# Patient Record
Sex: Male | Born: 1960 | Hispanic: Yes | Marital: Married | Smoking: Never smoker
Health system: Southern US, Community
[De-identification: ages and names within clinical notes are randomized; demographics above are authoritative.]

---

## 2017-08-04 ENCOUNTER — Emergency Department: Payer: Medicaid Other

## 2017-08-04 ENCOUNTER — Inpatient Hospital Stay: Payer: Medicaid Other | Admitting: Anesthesiology

## 2017-08-04 ENCOUNTER — Inpatient Hospital Stay
Admission: EM | Admit: 2017-08-04 | Discharge: 2017-09-03 | DRG: 853 | Disposition: E | Payer: Medicaid Other | Attending: Surgery | Admitting: Surgery

## 2017-08-04 ENCOUNTER — Encounter: Payer: Self-pay | Admitting: Radiology

## 2017-08-04 ENCOUNTER — Encounter: Admission: EM | Disposition: E | Payer: Self-pay | Source: Home / Self Care | Attending: Surgery

## 2017-08-04 ENCOUNTER — Other Ambulatory Visit: Payer: Self-pay

## 2017-08-04 DIAGNOSIS — M7989 Other specified soft tissue disorders: Secondary | ICD-10-CM | POA: Diagnosis not present

## 2017-08-04 DIAGNOSIS — K651 Peritoneal abscess: Secondary | ICD-10-CM | POA: Diagnosis present

## 2017-08-04 DIAGNOSIS — K659 Peritonitis, unspecified: Secondary | ICD-10-CM | POA: Diagnosis not present

## 2017-08-04 DIAGNOSIS — K668 Other specified disorders of peritoneum: Secondary | ICD-10-CM

## 2017-08-04 DIAGNOSIS — J189 Pneumonia, unspecified organism: Secondary | ICD-10-CM

## 2017-08-04 DIAGNOSIS — I96 Gangrene, not elsewhere classified: Secondary | ICD-10-CM | POA: Diagnosis present

## 2017-08-04 DIAGNOSIS — K567 Ileus, unspecified: Secondary | ICD-10-CM | POA: Diagnosis not present

## 2017-08-04 DIAGNOSIS — R6520 Severe sepsis without septic shock: Secondary | ICD-10-CM | POA: Diagnosis not present

## 2017-08-04 DIAGNOSIS — E7220 Disorder of urea cycle metabolism, unspecified: Secondary | ICD-10-CM | POA: Diagnosis present

## 2017-08-04 DIAGNOSIS — J9811 Atelectasis: Secondary | ICD-10-CM | POA: Diagnosis not present

## 2017-08-04 DIAGNOSIS — E8809 Other disorders of plasma-protein metabolism, not elsewhere classified: Secondary | ICD-10-CM | POA: Diagnosis present

## 2017-08-04 DIAGNOSIS — Z515 Encounter for palliative care: Secondary | ICD-10-CM | POA: Diagnosis present

## 2017-08-04 DIAGNOSIS — J9690 Respiratory failure, unspecified, unspecified whether with hypoxia or hypercapnia: Secondary | ICD-10-CM

## 2017-08-04 DIAGNOSIS — J9601 Acute respiratory failure with hypoxia: Secondary | ICD-10-CM | POA: Diagnosis not present

## 2017-08-04 DIAGNOSIS — D62 Acute posthemorrhagic anemia: Secondary | ICD-10-CM | POA: Diagnosis not present

## 2017-08-04 DIAGNOSIS — R748 Abnormal levels of other serum enzymes: Secondary | ICD-10-CM | POA: Diagnosis present

## 2017-08-04 DIAGNOSIS — J988 Other specified respiratory disorders: Secondary | ICD-10-CM

## 2017-08-04 DIAGNOSIS — K631 Perforation of intestine (nontraumatic): Secondary | ICD-10-CM | POA: Diagnosis present

## 2017-08-04 DIAGNOSIS — E872 Acidosis: Secondary | ICD-10-CM | POA: Diagnosis present

## 2017-08-04 DIAGNOSIS — N179 Acute kidney failure, unspecified: Secondary | ICD-10-CM

## 2017-08-04 DIAGNOSIS — Z4659 Encounter for fitting and adjustment of other gastrointestinal appliance and device: Secondary | ICD-10-CM

## 2017-08-04 DIAGNOSIS — E873 Alkalosis: Secondary | ICD-10-CM | POA: Diagnosis present

## 2017-08-04 DIAGNOSIS — Z6833 Body mass index (BMI) 33.0-33.9, adult: Secondary | ICD-10-CM

## 2017-08-04 DIAGNOSIS — K429 Umbilical hernia without obstruction or gangrene: Secondary | ICD-10-CM | POA: Diagnosis present

## 2017-08-04 DIAGNOSIS — R5381 Other malaise: Secondary | ICD-10-CM | POA: Diagnosis not present

## 2017-08-04 DIAGNOSIS — J9600 Acute respiratory failure, unspecified whether with hypoxia or hypercapnia: Secondary | ICD-10-CM | POA: Diagnosis not present

## 2017-08-04 DIAGNOSIS — N170 Acute kidney failure with tubular necrosis: Secondary | ICD-10-CM | POA: Diagnosis present

## 2017-08-04 DIAGNOSIS — Z452 Encounter for adjustment and management of vascular access device: Secondary | ICD-10-CM

## 2017-08-04 DIAGNOSIS — Z95828 Presence of other vascular implants and grafts: Secondary | ICD-10-CM

## 2017-08-04 DIAGNOSIS — R410 Disorientation, unspecified: Secondary | ICD-10-CM | POA: Diagnosis not present

## 2017-08-04 DIAGNOSIS — E871 Hypo-osmolality and hyponatremia: Secondary | ICD-10-CM | POA: Diagnosis present

## 2017-08-04 DIAGNOSIS — J90 Pleural effusion, not elsewhere classified: Secondary | ICD-10-CM | POA: Diagnosis present

## 2017-08-04 DIAGNOSIS — A419 Sepsis, unspecified organism: Principal | ICD-10-CM | POA: Diagnosis present

## 2017-08-04 DIAGNOSIS — Z66 Do not resuscitate: Secondary | ICD-10-CM | POA: Diagnosis present

## 2017-08-04 DIAGNOSIS — D696 Thrombocytopenia, unspecified: Secondary | ICD-10-CM | POA: Diagnosis present

## 2017-08-04 DIAGNOSIS — R109 Unspecified abdominal pain: Secondary | ICD-10-CM | POA: Diagnosis present

## 2017-08-04 DIAGNOSIS — K650 Generalized (acute) peritonitis: Secondary | ICD-10-CM | POA: Diagnosis present

## 2017-08-04 DIAGNOSIS — E861 Hypovolemia: Secondary | ICD-10-CM | POA: Diagnosis present

## 2017-08-04 DIAGNOSIS — E875 Hyperkalemia: Secondary | ICD-10-CM | POA: Diagnosis present

## 2017-08-04 DIAGNOSIS — E869 Volume depletion, unspecified: Secondary | ICD-10-CM | POA: Diagnosis present

## 2017-08-04 DIAGNOSIS — R6521 Severe sepsis with septic shock: Secondary | ICD-10-CM | POA: Diagnosis present

## 2017-08-04 DIAGNOSIS — Z978 Presence of other specified devices: Secondary | ICD-10-CM

## 2017-08-04 DIAGNOSIS — R451 Restlessness and agitation: Secondary | ICD-10-CM | POA: Diagnosis not present

## 2017-08-04 DIAGNOSIS — R140 Abdominal distension (gaseous): Secondary | ICD-10-CM

## 2017-08-04 DIAGNOSIS — E781 Pure hyperglyceridemia: Secondary | ICD-10-CM | POA: Diagnosis present

## 2017-08-04 DIAGNOSIS — S31109A Unspecified open wound of abdominal wall, unspecified quadrant without penetration into peritoneal cavity, initial encounter: Secondary | ICD-10-CM

## 2017-08-04 HISTORY — PX: LAPAROTOMY: SHX154

## 2017-08-04 LAB — CBC
HCT: 38 % — ABNORMAL LOW (ref 40.0–52.0)
Hemoglobin: 12.6 g/dL — ABNORMAL LOW (ref 13.0–18.0)
MCH: 24.3 pg — AB (ref 26.0–34.0)
MCHC: 33.1 g/dL (ref 32.0–36.0)
MCV: 73.3 fL — AB (ref 80.0–100.0)
PLATELETS: 319 10*3/uL (ref 150–440)
RBC: 5.19 MIL/uL (ref 4.40–5.90)
RDW: 18.5 % — AB (ref 11.5–14.5)
WBC: 24.6 10*3/uL — ABNORMAL HIGH (ref 3.8–10.6)

## 2017-08-04 LAB — BASIC METABOLIC PANEL
Anion gap: 27 — ABNORMAL HIGH (ref 5–15)
BUN: 96 mg/dL — AB (ref 6–20)
CALCIUM: 8.6 mg/dL — AB (ref 8.9–10.3)
CO2: 15 mmol/L — ABNORMAL LOW (ref 22–32)
CREATININE: 3.42 mg/dL — AB (ref 0.61–1.24)
Chloride: 91 mmol/L — ABNORMAL LOW (ref 101–111)
GFR calc Af Amer: 21 mL/min — ABNORMAL LOW (ref >60)
GFR calc non Af Amer: 18 mL/min — ABNORMAL LOW (ref >60)
GLUCOSE: 169 mg/dL — AB (ref 65–99)
Potassium: 5.2 mmol/L — ABNORMAL HIGH (ref 3.5–5.1)
SODIUM: 133 mmol/L — AB (ref 135–145)

## 2017-08-04 LAB — LACTIC ACID, PLASMA: LACTIC ACID, VENOUS: 5.7 mmol/L — AB (ref 0.5–1.9)

## 2017-08-04 SURGERY — LAPAROTOMY, EXPLORATORY
Anesthesia: General | Site: Abdomen | Wound class: Dirty or Infected

## 2017-08-04 MED ORDER — FENTANYL CITRATE (PF) 250 MCG/5ML IJ SOLN
INTRAMUSCULAR | Status: AC
  Filled 2017-08-04: qty 5

## 2017-08-04 MED ORDER — FENTANYL CITRATE (PF) 100 MCG/2ML IJ SOLN
INTRAMUSCULAR | Status: DC | PRN
  Administered 2017-08-04: 100 ug via INTRAVENOUS
  Administered 2017-08-05: 50 ug via INTRAVENOUS
  Administered 2017-08-05: 100 ug via INTRAVENOUS
  Administered 2017-08-05 (×4): 50 ug via INTRAVENOUS

## 2017-08-04 MED ORDER — MORPHINE SULFATE (PF) 4 MG/ML IV SOLN
4.0000 mg | Freq: Once | INTRAVENOUS | Status: AC
  Administered 2017-08-04: 4 mg via INTRAVENOUS
  Filled 2017-08-04: qty 1

## 2017-08-04 MED ORDER — SUCCINYLCHOLINE CHLORIDE 20 MG/ML IJ SOLN
INTRAMUSCULAR | Status: DC | PRN
  Administered 2017-08-04: 100 mg via INTRAVENOUS

## 2017-08-04 MED ORDER — LIDOCAINE HCL (CARDIAC) 20 MG/ML IV SOLN
INTRAVENOUS | Status: DC | PRN
  Administered 2017-08-04: 100 mg via INTRAVENOUS

## 2017-08-04 MED ORDER — PROPOFOL 10 MG/ML IV BOLUS
INTRAVENOUS | Status: DC | PRN
  Administered 2017-08-04: 80 mg via INTRAVENOUS

## 2017-08-04 MED ORDER — SODIUM CHLORIDE 0.9 % IV BOLUS
1000.0000 mL | Freq: Once | INTRAVENOUS | Status: AC
  Administered 2017-08-04: 1000 mL via INTRAVENOUS

## 2017-08-04 MED ORDER — SODIUM CHLORIDE 0.9 % IV SOLN
INTRAVENOUS | Status: DC | PRN
  Administered 2017-08-04: 23:00:00 via INTRAVENOUS

## 2017-08-04 MED ORDER — PROPOFOL 10 MG/ML IV BOLUS
INTRAVENOUS | Status: AC
Start: 2017-08-04 — End: ?
  Filled 2017-08-04: qty 20

## 2017-08-04 MED ORDER — PIPERACILLIN-TAZOBACTAM 3.375 G IVPB 30 MIN
3.3750 g | Freq: Once | INTRAVENOUS | Status: AC
  Administered 2017-08-04: 3.375 g via INTRAVENOUS
  Filled 2017-08-04: qty 50

## 2017-08-04 MED ORDER — IOPAMIDOL (ISOVUE-300) INJECTION 61%: 100.0000 mL | Freq: Once | INTRAVENOUS | Status: DC | PRN

## 2017-08-04 MED ORDER — LORAZEPAM 2 MG/ML IJ SOLN
INTRAMUSCULAR | Status: AC
  Administered 2017-08-04: 2 mg
  Filled 2017-08-04: qty 1

## 2017-08-04 SURGICAL SUPPLY — 40 items
CANISTER SUCT 1200ML W/VALVE (MISCELLANEOUS) ×3 IMPLANT
CANISTER SUCT 3000ML PPV (MISCELLANEOUS) ×6 IMPLANT
CHLORAPREP W/TINT 26ML (MISCELLANEOUS) ×3 IMPLANT
DRAPE LAPAROTOMY 100X77 ABD (DRAPES) ×3 IMPLANT
DRAPE TABLE BACK 80X90 (DRAPES) ×3 IMPLANT
DRSG OPSITE POSTOP 4X10 (GAUZE/BANDAGES/DRESSINGS) IMPLANT
DRSG OPSITE POSTOP 4X8 (GAUZE/BANDAGES/DRESSINGS) IMPLANT
DRSG TEGADERM 4X10 (GAUZE/BANDAGES/DRESSINGS) IMPLANT
DRSG TELFA 3X8 NADH (GAUZE/BANDAGES/DRESSINGS) IMPLANT
ELECT BLADE 6.5 EXT (BLADE) ×3 IMPLANT
ELECT CAUTERY BLADE 6.4 (BLADE) ×3 IMPLANT
ELECT REM PT RETURN 9FT ADLT (ELECTROSURGICAL) ×3
ELECTRODE REM PT RTRN 9FT ADLT (ELECTROSURGICAL) ×1 IMPLANT
GLOVE BIO SURGEON STRL SZ 6.5 (GLOVE) ×14 IMPLANT
GLOVE BIO SURGEONS STRL SZ 6.5 (GLOVE) ×7
GOWN STRL REUS W/ TWL LRG LVL3 (GOWN DISPOSABLE) ×3 IMPLANT
GOWN STRL REUS W/TWL LRG LVL3 (GOWN DISPOSABLE) ×6
HANDLE SUCTION POOLE (INSTRUMENTS) ×1 IMPLANT
KIT TURNOVER KIT A (KITS) ×3 IMPLANT
LABEL OR SOLS (LABEL) IMPLANT
LIGASURE IMPACT 36 18CM CVD LR (INSTRUMENTS) ×3 IMPLANT
NS IRRIG 1000ML POUR BTL (IV SOLUTION) ×18 IMPLANT
PACK BASIN MAJOR ARMC (MISCELLANEOUS) ×3 IMPLANT
PACK COLON CLEAN CLOSURE (MISCELLANEOUS) IMPLANT
RETAINER VISCERA MED (MISCELLANEOUS) ×3 IMPLANT
SPONGE LAP 18X18 5 PK (GAUZE/BANDAGES/DRESSINGS) ×3 IMPLANT
STAPLER SKIN PROX 35W (STAPLE) ×3 IMPLANT
SUCTION POOLE HANDLE (INSTRUMENTS) ×3
SUT PDS AB 1 TP1 54 (SUTURE) ×3 IMPLANT
SUT SILK 2 0 (SUTURE) ×2
SUT SILK 2-0 18XBRD TIE 12 (SUTURE) ×1 IMPLANT
SUT SILK 3 0 (SUTURE) ×2
SUT SILK 3-0 18XBRD TIE 12 (SUTURE) ×1 IMPLANT
SUT VIC AB 2-0 SH 27 (SUTURE) ×2
SUT VIC AB 2-0 SH 27XBRD (SUTURE) ×1 IMPLANT
SUT VIC AB 3-0 SH 27 (SUTURE) ×2
SUT VIC AB 3-0 SH 27X BRD (SUTURE) ×1 IMPLANT
SWAB CULTURE AMIES ANAERIB BLU (MISCELLANEOUS) ×3 IMPLANT
SYR BULB IRRIG 60ML STRL (SYRINGE) ×3 IMPLANT
TRAY FOLEY W/METER SILVER 16FR (SET/KITS/TRAYS/PACK) ×3 IMPLANT

## 2017-08-04 NOTE — Consult Note (Signed)
SURGICAL CONSULTATION NOTE   HISTORY OF PRESENT ILLNESS (HPI):  57 y.o. male presented to [facility] ED for evaluation of pneumoperitoneum. Patient's wife reports patient has been disoriented today and more drowsy after CT scan due to Ativan. History taken by wife. Wife of patient refer that patient has been feeling ill since last week but has been complaining of abdominal pain since 3 days ago. The pain has progressed and getting worse to the point that she asked him to take him to the Hospital. Wife refer he has has associated fever, nausea and vomiting. Today patient developed altered mental status. The wife refers that he has never gone to a physician since 22 years ago for the physical exam for the green card. Patient does not take any medication at home. Wife denies use of illicit drug or alcohol intake. Denies diarrhea.   Surgery is consulted by Dr. Derrill KayGoodman in this context for evaluation and management of pneumoperitoneum.  PAST MEDICAL HISTORY (PMH):  History reviewed. No pertinent past medical history.   PAST SURGICAL HISTORY (PSH):   The history reviewed.   MEDICATIONS:  Prior to Admission medications   Not on File    ALLERGIES:  Not on File   SOCIAL HISTORY:  Social History   Socioeconomic History  . Marital status: Married    Spouse name: Not on file  . Number of children: Not on file  . Years of education: Not on file  . Highest education level: Not on file  Occupational History  . Not on file  Social Needs  . Financial resource strain: Not on file  . Food insecurity:    Worry: Not on file    Inability: Not on file  . Transportation needs:    Medical: Not on file    Non-medical: Not on file  Tobacco Use  . Smoking status: Not on file  Substance and Sexual Activity  . Alcohol use: Not on file  . Drug use: Not on file  . Sexual activity: Not on file  Lifestyle  . Physical activity:    Days per week: Not on file    Minutes per session: Not on file  . Stress: Not  on file  Relationships  . Social connections:    Talks on phone: Not on file    Gets together: Not on file    Attends religious service: Not on file    Active member of club or organization: Not on file    Attends meetings of clubs or organizations: Not on file    Relationship status: Not on file  . Intimate partner violence:    Fear of current or ex partner: Not on file    Emotionally abused: Not on file    Physically abused: Not on file    Forced sexual activity: Not on file  Other Topics Concern  . Not on file  Social History Narrative  . Not on file    The patient currently resides (home / rehab facility / nursing home): Home The patient normally is (ambulatory / bedbound): Ambulatory   FAMILY HISTORY:  No family history on file.   REVIEW OF SYSTEMS: (history by wife) Constitutional: denies weight loss, Positive for fever, chills, or sweats  Eyes: denies any other vision changes, history of eye injury  ENT: denies sore throat, hearing problems  Respiratory: denies shortness of breath, wheezing. Positive for respiratory distress  Cardiovascular: denies chest pain, palpitations  Gastrointestinal: Positive for denies abdominal pain, N/V, See HPI Genitourinary: denies burning  with urination or urinary frequency Musculoskeletal: denies any other joint pains or cramps  Skin: denies any other rashes or skin discolorations  Neurological: denies any other headache, dizziness, weakness. Positive for changes on mental status today Psychiatric: denies any other depression, anxiety   All other review of systems were negative   VITAL SIGNS:  Pulse Rate:  [109-114] 109 (04/01 2307) Resp:  [18-28] 20 (04/01 2307) BP: (107-156)/(78-101) 137/101 (04/01 2301) SpO2:  [88 %-100 %] 93 % (04/01 2307) Weight:  [88.9 kg (196 lb)] 88.9 kg (196 lb) (04/01 2017)     Height: 5\' 7"  (170.2 cm) Weight: 88.9 kg (196 lb) BMI (Calculated): 30.69   INTAKE/OUTPUT:  This shift: No intake/output data  recorded.  Last 2 shifts: @IOLAST2SHIFTS @   PHYSICAL EXAM:  Constitutional:  -- Normal body habitus  -- Sleepy, disoriented  Eyes:  -- Pupils equally round and reactive to light  -- No scleral icterus  Ear, nose, and throat:  -- No jugular venous distension  Pulmonary:  -- No crackles  -- Equal breath sounds bilaterally -- Breathing non-labored at rest Cardiovascular:  -- regular rhythm, tachycardia Gastrointestinal:  -- Abdomen: distended, tenser with guarding, tympanic  Musculoskeletal and Integumentary:  -- Wounds or skin discoloration: None appreciated -- Extremities: no edema   Labs:  CBC Latest Ref Rng & Units 08/14/2017  WBC 3.8 - 10.6 K/uL 24.6(H)  Hemoglobin 13.0 - 18.0 g/dL 12.6(L)  Hematocrit 40.0 - 52.0 % 38.0(L)  Platelets 150 - 440 K/uL 319   CMP Latest Ref Rng & Units 08/05/2017  Glucose 65 - 99 mg/dL 098(J)  BUN 6 - 20 mg/dL 19(J)  Creatinine 4.78 - 1.24 mg/dL 2.95(A)  Sodium 213 - 086 mmol/L 133(L)  Potassium 3.5 - 5.1 mmol/L 5.2(H)  Chloride 101 - 111 mmol/L 91(L)  CO2 22 - 32 mmol/L 15(L)  Calcium 8.9 - 10.3 mg/dL 5.7(Q)    Imaging studies:  Abdominal CT scan shows abundant pneumoperitoneum with moderate free fluid. No obvious area of perforation  Assessment/Plan:  57 y.o. male with bowel perforation of 3 days progression. Today already with altered mental status. Images reviewed and discussed with wife. Wife oriented that patient needs exploratory laparotomy for diagnosis and treatment of hollow viscus perforation. Currently patient with sepsis, antibiotic started, aggressive hydration with 0.9NSS. Foley and NGT placed. Discussion with wife about surgical possibilities from perforated gastric ulcer to colon perforation and possible need of bowel resection/ostomy. Also discussed the high possibilities of needing intensive care unit after surgery due to patient critical condition. Possible post surgical Scenarios discussed: respiratory failure, septic  shock, need of colostomy, among others.   Gae Gallop, MD

## 2017-08-04 NOTE — ED Notes (Signed)
Surgeon at bedside.  

## 2017-08-04 NOTE — ED Triage Notes (Signed)
Pt reports that he developed abd pain, back pain and is becoming altered. He is unable to follow directions.

## 2017-08-04 NOTE — ED Notes (Signed)
Orderly here to take pt to OR. 

## 2017-08-04 NOTE — ED Notes (Signed)
Pt placed on 2L of oxygen due to oxygen 88% on RA, pt now at 94% on 2L.

## 2017-08-04 NOTE — ED Notes (Signed)
Pt taken to CT.

## 2017-08-04 NOTE — ED Triage Notes (Signed)
Pt is now saying that he feel last week, and is now complaining of her RLQ and LLQ pain. Pt is clammy and tachy at this time. He reports that he fell backward and hit his head. He is repeative. Wife reports that he is not himself.

## 2017-08-04 NOTE — ED Provider Notes (Signed)
[facility] Emergency Department Provider Note   ____________________________________________   I have reviewed the triage vital signs and the nursing notes.   HISTORY  Chief Complaint Abdominal Pain; Back Pain; and Altered Mental Status   History limited by and level 5 caveat due to AMS   HPI Cory Graham is a 57 y.o. male who presents to the emergency department today because of concern for abdominal pain and altered mental status. The history is primarily from family. Apparently last week the patient had a fall at work. Had been okay until 2 days ago when he started complaining of abdominal pain. It has been severe it is located in the upper abdomen. The patient has also been confused. He has been constipated.    No past medical history on file.  There are no active problems to display for this patient.   Prior to Admission medications   Not on File    Allergies Patient has no allergy information on record.  No family history on file.  Social History Former drinker  Review of Systems Abdominal pain Unable to obtain complete ROS secondary to altered mental status.   ____________________________________________   PHYSICAL EXAM:  VITAL SIGNS: ED Triage Vitals  Enc Vitals Group     BP 08/31/2017 2009 124/78     Pulse Rate 08/16/2017 2009 (!) 114     Resp --      Temp --      Temp src --      SpO2 08/12/2017 2009 93 %     Weight 08/07/2017 2017 196 lb (88.9 kg)     Height 09/01/2017 2017 5\' 7"  (1.702 m)   Constitutional: Awake, intermittently alert. Not oriented.  Eyes: Conjunctivae are normal.  ENT   Head: Normocephalic and atraumatic.   Nose: No congestion/rhinnorhea.   Mouth/Throat: Mucous membranes are moist.   Neck: No stridor. Hematological/Lymphatic/Immunilogical: No cervical lymphadenopathy. Cardiovascular: Tachycardic, regular rhythm.  No murmurs, rubs, or gallops.  Respiratory: Normal respiratory effort without  tachypnea nor retractions. Breath sounds are clear and equal bilaterally. No wheezes/rales/rhonchi. Gastrointestinal: Soft and diffusely tender. Distended. Genitourinary: Deferred Musculoskeletal: Normal range of motion in all extremities. No lower extremity edema. Neurologic:  Not completely oriented. Moves all extremities. Confused. Skin:  Skin is warm, dry and intact. No rash noted. ____________________________________________    LABS (pertinent positives/negatives)  Lactic acid 5.7 CBC wbc 24.6, hgb 12.6, plt 319 BMP na 133, k 5.2, cr 3.42, glu 169  ____________________________________________   EKG  I, Phineas SemenGraydon Tranice Laduke, attending physician, personally viewed and interpreted this EKG  EKG Time: 2025 Rate: 111 Rhythm: sinus tachycardia Axis: left axis deviation Intervals: qtc 452 QRS: narrow ST changes: no st elevation Impression: abnormal ekg   ____________________________________________    RADIOLOGY  CT head No acute findings  CT abd/pel Large volume of free air  I, Evangelyn Crouse, personally discussed these images (CT abd/pel) and results by phone with the on-call radiologist and used this discussion as part of my medical decision making.   ____________________________________________   PROCEDURES  Procedures  CRITICAL CARE Performed by: Phineas SemenGOODMAN, Dianna Deshler   Total critical care time: 35 minutes  Critical care time was exclusive of separately billable procedures and treating other patients.  Critical care was necessary to treat or prevent imminent or life-threatening deterioration.  Critical care was time spent personally by me on the following activities: development of treatment plan with patient and/or surrogate as well as nursing, discussions with consultants, evaluation of patient's response to treatment,  examination of patient, obtaining history from patient or surrogate, ordering and performing treatments and interventions, ordering and review  of laboratory studies, ordering and review of radiographic studies, pulse oximetry and re-evaluation of patient's condition.  ____________________________________________   INITIAL IMPRESSION / ASSESSMENT AND PLAN / ED COURSE  Pertinent labs & imaging results that were available during my care of the patient were reviewed by me and considered in my medical decision making (see chart for details).  Presented to the emergency department today because of concerns for abdominal pain and altered mental status.  The patient could not give a good history.  He was tachycardic.  Did have concerns for intra-abdominal infection. Patient with significant leukocytosis and lactic acidosis. Patient was given zosyn. CT scan showed large volume of free air. Discussed with Dr. Maia Plan, surgeon on call who will take patient to the operating room.   ____________________________________________   FINAL CLINICAL IMPRESSION(S) / ED DIAGNOSES  Final diagnoses:  Abdominal pain, unspecified abdominal location  Free intraperitoneal air     Note: This dictation was prepared with Dragon dictation. Any transcriptional errors that result from this process are unintentional     Phineas Semen, MD 08/05/17 0001

## 2017-08-04 NOTE — ED Notes (Signed)
Pt's wife signed consent for surgery, pt medicated and unable to sign at this time. Placed on pt's chart.

## 2017-08-04 NOTE — ED Notes (Signed)
Date and time results received: 08/27/2017 2130   Test: lactic acid Critical Value: 5.7  Name of Provider Notified: Dr. Derrill KayGoodman

## 2017-08-04 NOTE — ED Notes (Signed)
Per wife pt told her that he fell backwards. Thinks he fell Friday because he was at work Saturday. Wife states pt has been repetitive today only. Denies pain in head. Pt points to pain upper belly. Pt keeps eyes closed. Speaking spanish. Pt abd appears distended. Wife states he was constipated and took a laxative. Wife states pt has been drinking milk which usually makes him have diarrhea. Denies ETOH use recently, wife states he used to drink. Unsure of liver disease. States back pain as well. States CP as well. Keeps pointing to upper abd. Wife states pt hasn't been to the doctor in 22 years. Pt answering questions in spanish. Keeps repeating things. Able to hold both hands in the air.

## 2017-08-05 ENCOUNTER — Encounter: Payer: Self-pay | Admitting: General Surgery

## 2017-08-05 ENCOUNTER — Inpatient Hospital Stay: Payer: Medicaid Other

## 2017-08-05 DIAGNOSIS — J9811 Atelectasis: Secondary | ICD-10-CM

## 2017-08-05 DIAGNOSIS — K631 Perforation of intestine (nontraumatic): Secondary | ICD-10-CM

## 2017-08-05 DIAGNOSIS — M7989 Other specified soft tissue disorders: Secondary | ICD-10-CM

## 2017-08-05 DIAGNOSIS — J9601 Acute respiratory failure with hypoxia: Secondary | ICD-10-CM

## 2017-08-05 DIAGNOSIS — A419 Sepsis, unspecified organism: Secondary | ICD-10-CM

## 2017-08-05 DIAGNOSIS — K429 Umbilical hernia without obstruction or gangrene: Secondary | ICD-10-CM

## 2017-08-05 LAB — BLOOD GAS, ARTERIAL
ACID-BASE DEFICIT: 9.9 mmol/L — AB (ref 0.0–2.0)
ACID-BASE DEFICIT: 9 mmol/L — AB (ref 0.0–2.0)
Allens test (pass/fail): POSITIVE — AB
Bicarbonate: 19.1 mmol/L — ABNORMAL LOW (ref 20.0–28.0)
Bicarbonate: 16.8 mmol/L — ABNORMAL LOW (ref 20.0–28.0)
FIO2: 0.5
FIO2: 0.4
LHR: 15 {breaths}/min
LHR: 22 {breaths}/min
Mechanical Rate: 15
O2 SAT: 96 %
O2 SAT: 93 %
PATIENT TEMPERATURE: 37
PCO2 ART: 56 mmHg — AB (ref 32.0–48.0)
PCO2 ART: 35 mmHg (ref 32.0–48.0)
PEEP/CPAP: 5 cmH2O
PEEP/CPAP: 5 cmH2O
PH ART: 7.29 — AB (ref 7.350–7.450)
Patient temperature: 37
VT: 500 mL
VT: 550 mL
pH, Arterial: 7.14 — CL (ref 7.350–7.450)
pO2, Arterial: 105 mmHg (ref 83.0–108.0)
pO2, Arterial: 75 mmHg — ABNORMAL LOW (ref 83.0–108.0)

## 2017-08-05 LAB — CBC WITH DIFFERENTIAL/PLATELET
BASOS ABS: 0 10*3/uL (ref 0–0.1)
Band Neutrophils: 27 %
Basophils Relative: 0 %
Blasts: 0 %
EOS PCT: 0 %
Eosinophils Absolute: 0 10*3/uL (ref 0–0.7)
HEMATOCRIT: 32.1 % — AB (ref 40.0–52.0)
Hemoglobin: 10.1 g/dL — ABNORMAL LOW (ref 13.0–18.0)
Lymphocytes Relative: 8 %
Lymphs Abs: 1 10*3/uL (ref 1.0–3.6)
MCH: 23.8 pg — ABNORMAL LOW (ref 26.0–34.0)
MCHC: 31.6 g/dL — ABNORMAL LOW (ref 32.0–36.0)
MCV: 75.3 fL — ABNORMAL LOW (ref 80.0–100.0)
METAMYELOCYTES PCT: 2 %
MYELOCYTES: 1 %
Monocytes Absolute: 0.1 10*3/uL — ABNORMAL LOW (ref 0.2–1.0)
Monocytes Relative: 1 %
NEUTROS PCT: 61 %
Neutro Abs: 11.5 10*3/uL — ABNORMAL HIGH (ref 1.4–6.5)
Other: 0 %
PLATELETS: 210 10*3/uL (ref 150–440)
PROMYELOCYTES ABS: 0 %
RBC: 4.27 MIL/uL — AB (ref 4.40–5.90)
RDW: 18.4 % — AB (ref 11.5–14.5)
SMEAR REVIEW: ADEQUATE
WBC: 12.6 10*3/uL — AB (ref 3.8–10.6)
nRBC: 1 /100 WBC — ABNORMAL HIGH

## 2017-08-05 LAB — URINALYSIS, COMPLETE (UACMP) WITH MICROSCOPIC
BILIRUBIN URINE: NEGATIVE
Bacteria, UA: NONE SEEN
Glucose, UA: NEGATIVE mg/dL
Ketones, ur: NEGATIVE mg/dL
LEUKOCYTES UA: NEGATIVE
NITRITE: NEGATIVE
PH: 5 (ref 5.0–8.0)
Protein, ur: 30 mg/dL — AB
SPECIFIC GRAVITY, URINE: 1.015 (ref 1.005–1.030)

## 2017-08-05 LAB — BASIC METABOLIC PANEL
Anion gap: 16 — ABNORMAL HIGH (ref 5–15)
BUN: 100 mg/dL — AB (ref 6–20)
CHLORIDE: 104 mmol/L (ref 101–111)
CO2: 16 mmol/L — AB (ref 22–32)
Calcium: 6.5 mg/dL — ABNORMAL LOW (ref 8.9–10.3)
Creatinine, Ser: 3.4 mg/dL — ABNORMAL HIGH (ref 0.61–1.24)
GFR calc Af Amer: 21 mL/min — ABNORMAL LOW (ref >60)
GFR calc non Af Amer: 18 mL/min — ABNORMAL LOW (ref >60)
GLUCOSE: 107 mg/dL — AB (ref 65–99)
POTASSIUM: 5.8 mmol/L — AB (ref 3.5–5.1)
Sodium: 136 mmol/L (ref 135–145)

## 2017-08-05 LAB — COMPREHENSIVE METABOLIC PANEL
ALT: 37 U/L (ref 17–63)
ANION GAP: 17 — AB (ref 5–15)
AST: 86 U/L — ABNORMAL HIGH (ref 15–41)
Albumin: 1.8 g/dL — ABNORMAL LOW (ref 3.5–5.0)
Alkaline Phosphatase: 151 U/L — ABNORMAL HIGH (ref 38–126)
BILIRUBIN TOTAL: 2 mg/dL — AB (ref 0.3–1.2)
BUN: 100 mg/dL — ABNORMAL HIGH (ref 6–20)
CALCIUM: 7.2 mg/dL — AB (ref 8.9–10.3)
CO2: 18 mmol/L — AB (ref 22–32)
CREATININE: 2.91 mg/dL — AB (ref 0.61–1.24)
Chloride: 98 mmol/L — ABNORMAL LOW (ref 101–111)
GFR calc Af Amer: 26 mL/min — ABNORMAL LOW (ref >60)
GFR, EST NON AFRICAN AMERICAN: 22 mL/min — AB (ref >60)
Glucose, Bld: 135 mg/dL — ABNORMAL HIGH (ref 65–99)
Potassium: 5 mmol/L (ref 3.5–5.1)
Sodium: 133 mmol/L — ABNORMAL LOW (ref 135–145)
TOTAL PROTEIN: 5.3 g/dL — AB (ref 6.5–8.1)

## 2017-08-05 LAB — TROPONIN I

## 2017-08-05 LAB — GLUCOSE, CAPILLARY
GLUCOSE-CAPILLARY: 101 mg/dL — AB (ref 65–99)
GLUCOSE-CAPILLARY: 111 mg/dL — AB (ref 65–99)
GLUCOSE-CAPILLARY: 118 mg/dL — AB (ref 65–99)
GLUCOSE-CAPILLARY: 93 mg/dL (ref 65–99)
Glucose-Capillary: 117 mg/dL — ABNORMAL HIGH (ref 65–99)
Glucose-Capillary: 106 mg/dL — ABNORMAL HIGH (ref 65–99)

## 2017-08-05 LAB — MRSA PCR SCREENING: MRSA by PCR: NEGATIVE

## 2017-08-05 LAB — LACTIC ACID, PLASMA
LACTIC ACID, VENOUS: 2.3 mmol/L — AB (ref 0.5–1.9)
Lactic Acid, Venous: 4.9 mmol/L (ref 0.5–1.9)
Lactic Acid, Venous: 3.2 mmol/L (ref 0.5–1.9)

## 2017-08-05 LAB — AMMONIA: Ammonia: 55 umol/L — ABNORMAL HIGH (ref 9–35)

## 2017-08-05 LAB — PROCALCITONIN: Procalcitonin: 28.14 ng/mL

## 2017-08-05 MED ORDER — FENTANYL CITRATE (PF) 250 MCG/5ML IJ SOLN
INTRAMUSCULAR | Status: AC
  Filled 2017-08-05: qty 5

## 2017-08-05 MED ORDER — LACTATED RINGERS IV SOLN
INTRAVENOUS | Status: DC | PRN
  Administered 2017-08-05 (×3): via INTRAVENOUS

## 2017-08-05 MED ORDER — ALBUMIN HUMAN 25 % IV SOLN
25.0000 g | Freq: Once | INTRAVENOUS | Status: AC
  Administered 2017-08-05: 25 g via INTRAVENOUS
  Filled 2017-08-05 (×2): qty 100

## 2017-08-05 MED ORDER — PANTOPRAZOLE SODIUM 40 MG IV SOLR
40.0000 mg | Freq: Every day | INTRAVENOUS | Status: DC
  Administered 2017-08-05 – 2017-08-09 (×5): 40 mg via INTRAVENOUS
  Filled 2017-08-05 (×5): qty 40

## 2017-08-05 MED ORDER — CHLORHEXIDINE GLUCONATE 0.12% ORAL RINSE (MEDLINE KIT)
15.0000 mL | Freq: Two times a day (BID) | OROMUCOSAL | Status: DC
  Administered 2017-08-05 – 2017-08-11 (×13): 15 mL via OROMUCOSAL

## 2017-08-05 MED ORDER — PIPERACILLIN-TAZOBACTAM 3.375 G IVPB
3.3750 g | Freq: Three times a day (TID) | INTRAVENOUS | Status: DC
  Administered 2017-08-05 – 2017-08-09 (×13): 3.375 g via INTRAVENOUS
  Filled 2017-08-05 (×13): qty 50

## 2017-08-05 MED ORDER — SODIUM CHLORIDE 0.9 % IV BOLUS
500.0000 mL | Freq: Once | INTRAVENOUS | Status: AC
  Administered 2017-08-05: 500 mL via INTRAVENOUS

## 2017-08-05 MED ORDER — SEVOFLURANE IN SOLN
RESPIRATORY_TRACT | Status: AC
  Filled 2017-08-05: qty 250

## 2017-08-05 MED ORDER — SODIUM CHLORIDE 0.9 % IV BOLUS
1000.0000 mL | Freq: Once | INTRAVENOUS | Status: AC
  Administered 2017-08-05: 1000 mL via INTRAVENOUS

## 2017-08-05 MED ORDER — MORPHINE SULFATE (PF) 4 MG/ML IV SOLN
4.0000 mg | INTRAVENOUS | Status: DC | PRN
  Administered 2017-08-09 – 2017-08-10 (×2): 4 mg via INTRAVENOUS
  Filled 2017-08-05 (×2): qty 1

## 2017-08-05 MED ORDER — 0.9 % SODIUM CHLORIDE (POUR BTL) OPTIME
TOPICAL | Status: DC | PRN
  Administered 2017-08-05: 7000 mL

## 2017-08-05 MED ORDER — MIDAZOLAM HCL 2 MG/2ML IJ SOLN
2.0000 mg | INTRAMUSCULAR | Status: AC | PRN
  Administered 2017-08-05 – 2017-08-12 (×3): 2 mg via INTRAVENOUS
  Filled 2017-08-05 (×6): qty 2

## 2017-08-05 MED ORDER — MIDAZOLAM HCL 2 MG/2ML IJ SOLN
2.0000 mg | INTRAMUSCULAR | Status: DC | PRN
  Administered 2017-08-06 – 2017-08-12 (×9): 2 mg via INTRAVENOUS
  Filled 2017-08-05 (×7): qty 2

## 2017-08-05 MED ORDER — FENTANYL 2500MCG IN NS 250ML (10MCG/ML) PREMIX INFUSION
25.0000 ug/h | INTRAVENOUS | Status: DC
  Administered 2017-08-05: 50 ug/h via INTRAVENOUS
  Administered 2017-08-05: 200 ug/h via INTRAVENOUS
  Administered 2017-08-06 – 2017-08-07 (×2): 125 ug/h via INTRAVENOUS
  Administered 2017-08-08: 325 ug/h via INTRAVENOUS
  Administered 2017-08-08: 300 ug/h via INTRAVENOUS
  Administered 2017-08-09: 325 ug/h via INTRAVENOUS
  Administered 2017-08-09: 300 ug/h via INTRAVENOUS
  Administered 2017-08-09: 150 ug/h via INTRAVENOUS
  Administered 2017-08-10: 200 ug/h via INTRAVENOUS
  Administered 2017-08-10 – 2017-08-11 (×4): 400 ug/h via INTRAVENOUS
  Filled 2017-08-05 (×16): qty 250

## 2017-08-05 MED ORDER — PHENYLEPHRINE HCL 10 MG/ML IJ SOLN
INTRAMUSCULAR | Status: DC | PRN
  Administered 2017-08-05 (×2): 100 ug via INTRAVENOUS

## 2017-08-05 MED ORDER — FENTANYL BOLUS VIA INFUSION
50.0000 ug | INTRAVENOUS | Status: DC | PRN
  Administered 2017-08-05 – 2017-08-10 (×19): 50 ug via INTRAVENOUS
  Filled 2017-08-05: qty 50

## 2017-08-05 MED ORDER — ENOXAPARIN SODIUM 30 MG/0.3ML ~~LOC~~ SOLN
30.0000 mg | SUBCUTANEOUS | Status: DC
  Administered 2017-08-05 – 2017-08-09 (×5): 30 mg via SUBCUTANEOUS
  Filled 2017-08-05 (×5): qty 0.3

## 2017-08-05 MED ORDER — ORAL CARE MOUTH RINSE
15.0000 mL | OROMUCOSAL | Status: DC
  Administered 2017-08-05 – 2017-08-11 (×60): 15 mL via OROMUCOSAL

## 2017-08-05 MED ORDER — ROCURONIUM BROMIDE 100 MG/10ML IV SOLN
INTRAVENOUS | Status: DC | PRN
  Administered 2017-08-05: 30 mg via INTRAVENOUS
  Administered 2017-08-05: 50 mg via INTRAVENOUS
  Administered 2017-08-05: 30 mg via INTRAVENOUS
  Administered 2017-08-05 (×2): 20 mg via INTRAVENOUS

## 2017-08-05 MED ORDER — LIDOCAINE HCL (PF) 2 % IJ SOLN
INTRAMUSCULAR | Status: AC
  Filled 2017-08-05: qty 10

## 2017-08-05 MED ORDER — SODIUM CHLORIDE 0.9 % IV SOLN
INTRAVENOUS | Status: DC
  Administered 2017-08-05 – 2017-08-06 (×5): via INTRAVENOUS

## 2017-08-05 MED ORDER — FENTANYL CITRATE (PF) 100 MCG/2ML IJ SOLN
50.0000 ug | Freq: Once | INTRAMUSCULAR | Status: AC
  Administered 2017-08-05: 50 ug via INTRAVENOUS
  Filled 2017-08-05: qty 2

## 2017-08-05 NOTE — Transfer of Care (Signed)
Immediate Anesthesia Transfer of Care Note  Patient: Cory Graham  Procedure(s) Performed: EXPLORATORY LAPAROTOMY, removal of abdomen, peritoneal lavage, umbilical hernia repair (N/A Abdomen)  Patient Location: PACU  Anesthesia Type:General  Level of Consciousness: Patient remains intubated per anesthesia plan  Airway & Oxygen Therapy: Patient placed on Ventilator (see vital sign flow sheet for setting)  Post-op Assessment: Post -op Vital signs reviewed and stable  Post vital signs: Reviewed and stable  Last Vitals:  Vitals Value Taken Time  BP 126/70 08/05/2017  3:47 AM  Temp    Pulse 114 08/05/2017  3:57 AM  Resp    SpO2 95 % 08/05/2017  3:57 AM  Vitals shown include unvalidated device data.  Last Pain:  Vitals:   08/15/2017 2301  PainSc: Asleep         Complications: No apparent anesthesia complications

## 2017-08-05 NOTE — H&P (Signed)
SURGICAL CONSULTATION NOTE   HISTORY OF PRESENT ILLNESS (HPI):  57 y.o. male presented to [facility] ED for evaluation of pneumoperitoneum. Patient's wife reports patient has been disoriented today and more drowsy after CT scan due to Ativan. History taken by wife. Wife of patient refer that patient has been feeling ill since last week but has been complaining of abdominal pain since 3 days ago. The pain has progressed and getting worse to the point that she asked him to take him to the Hospital. Wife refer he has has associated fever, nausea and vomiting. Today patient developed altered mental status. The wife refers that he has never gone to a physician since 22 years ago for the physical exam for the green card. Patient does not take any medication at home. Wife denies use of illicit drug or alcohol intake. Denies diarrhea.   Surgery is consulted by Dr. Derrill KayGoodman in this context for evaluation and management of pneumoperitoneum.  PAST MEDICAL HISTORY (PMH):  History reviewed. No pertinent past medical history.   PAST SURGICAL HISTORY (PSH):   The history reviewed.   MEDICATIONS:  Prior to Admission medications   Not on File    ALLERGIES:  Not on File   SOCIAL HISTORY:  Social History        Socioeconomic History  . Marital status: Married    Spouse name: Not on file  . Number of children: Not on file  . Years of education: Not on file  . Highest education level: Not on file  Occupational History  . Not on file  Social Needs  . Financial resource strain: Not on file  . Food insecurity:    Worry: Not on file    Inability: Not on file  . Transportation needs:    Medical: Not on file    Non-medical: Not on file  Tobacco Use  . Smoking status: Not on file  Substance and Sexual Activity  . Alcohol use: Not on file  . Drug use: Not on file  . Sexual activity: Not on file  Lifestyle  . Physical activity:    Days per week: Not on file    Minutes per session:  Not on file  . Stress: Not on file  Relationships  . Social connections:    Talks on phone: Not on file    Gets together: Not on file    Attends religious service: Not on file    Active member of club or organization: Not on file    Attends meetings of clubs or organizations: Not on file    Relationship status: Not on file  . Intimate partner violence:    Fear of current or ex partner: Not on file    Emotionally abused: Not on file    Physically abused: Not on file    Forced sexual activity: Not on file  Other Topics Concern  . Not on file  Social History Narrative  . Not on file    The patient currently resides (home / rehab facility / nursing home): Home The patient normally is (ambulatory / bedbound): Ambulatory   FAMILY HISTORY:  No family history on file.   REVIEW OF SYSTEMS: (history by wife) Constitutional: denies weight loss, Positive for fever, chills, or sweats  Eyes: denies any other vision changes, history of eye injury  ENT: denies sore throat, hearing problems  Respiratory: denies shortness of breath, wheezing. Positive for respiratory distress  Cardiovascular: denies chest pain, palpitations  Gastrointestinal: Positive for denies abdominal pain, N/V,  See HPI Genitourinary: denies burning with urination or urinary frequency Musculoskeletal: denies any other joint pains or cramps  Skin: denies any other rashes or skin discolorations  Neurological: denies any other headache, dizziness, weakness. Positive for changes on mental status today Psychiatric: denies any other depression, anxiety   All other review of systems were negative   VITAL SIGNS:  Pulse Rate:  [109-114] 109 (04/01 2307) Resp:  [18-28] 20 (04/01 2307) BP: (107-156)/(78-101) 137/101 (04/01 2301) SpO2:  [88 %-100 %] 93 % (04/01 2307) Weight:  [88.9 kg (196 lb)] 88.9 kg (196 lb) (04/01 2017)     Height: 5\' 7"  (170.2 cm) Weight: 88.9 kg (196 lb) BMI (Calculated): 30.69    INTAKE/OUTPUT:  This shift: No intake/output data recorded.  Last 2 shifts: @IOLAST2SHIFTS @   PHYSICAL EXAM:  Constitutional:  -- Normal body habitus  -- Sleepy, disoriented  Eyes:  -- Pupils equally round and reactive to light  -- No scleral icterus  Ear, nose, and throat:  -- No jugular venous distension  Pulmonary:  -- No crackles  -- Equal breath sounds bilaterally -- Breathing non-labored at rest Cardiovascular:  -- regular rhythm, tachycardia Gastrointestinal:  -- Abdomen: distended, tenser with guarding, tympanic  Musculoskeletal and Integumentary:  -- Wounds or skin discoloration: None appreciated -- Extremities: no edema   Labs:  CBC Latest Ref Rng & Units 08/28/2017  WBC 3.8 - 10.6 K/uL 24.6(H)  Hemoglobin 13.0 - 18.0 g/dL 12.6(L)  Hematocrit 40.0 - 52.0 % 38.0(L)  Platelets 150 - 440 K/uL 319   CMP Latest Ref Rng & Units 08/20/2017  Glucose 65 - 99 mg/dL 409(W)  BUN 6 - 20 mg/dL 11(B)  Creatinine 1.47 - 1.24 mg/dL 8.29(F)  Sodium 621 - 308 mmol/L 133(L)  Potassium 3.5 - 5.1 mmol/L 5.2(H)  Chloride 101 - 111 mmol/L 91(L)  CO2 22 - 32 mmol/L 15(L)  Calcium 8.9 - 10.3 mg/dL 6.5(H)    Imaging studies:  Abdominal CT scan shows abundant pneumoperitoneum with moderate free fluid. No obvious area of perforation  Assessment/Plan:  57 y.o. male with bowel perforation of 3 days progression. Today already with altered mental status. Images reviewed and discussed with wife. Wife oriented that patient needs exploratory laparotomy for diagnosis and treatment of hollow viscus perforation. Currently patient with sepsis, antibiotic started, aggressive hydration with 0.9NSS. Foley and NGT placed. Discussion with wife about surgical possibilities from perforated gastric ulcer to colon perforation and possible need of bowel resection/ostomy. Also discussed the high possibilities of needing intensive care unit after surgery due to patient critical condition. Possible post  surgical Scenarios discussed: respiratory failure, septic shock, need of colostomy, among others.   Will admit for surgical management.   Gae Gallop, MD

## 2017-08-05 NOTE — Progress Notes (Signed)
Pt received from OR following expl. Lap. Comfortable on vent.

## 2017-08-05 NOTE — Progress Notes (Signed)
Initial Nutrition Assessment  DOCUMENTATION CODES:   Obesity unspecified  INTERVENTION:  If patient expected to remain intubated >24-48 hours, recommend deferring timing of initiation of tube feeds to surgery.  If tube feeds are initiated recommend Vital High Protein at 30 mL/hr (720 mL goal daily volume) + Pro-Stat 60 mL TID via NGT. Provides 1320 kcal, 153 grams of protein, 605 mL H2O daily.  Also recommend liquid MVI daily per tube if tube feeds are started.  NUTRITION DIAGNOSIS:   Inadequate oral intake related to inability to eat as evidenced by NPO status.  GOAL:   Provide needs based on ASPEN/SCCM guidelines  MONITOR:   Vent status, Labs, Weight trends, TF tolerance, I & O's  REASON FOR ASSESSMENT:   Ventilator    ASSESSMENT:   57 year old male with no significant PMHx presented for evaluation of abdominal pain, fever, N/V, and AMS found to have pneumoperitoneum and umbilical hernia s/p exploratory laparotomy, peritoneal lavage, and umbilical hernia repair early AM of 4/2 but no source of perforation was found.   Patient intubated and sedated. No family members present at time of RD assessment. NGT to suction with <100 mL output in cannister this morning. RD did not assess abdomen as patient is s/p exploratory laparotomy. Per RN assessment of abdomen patient has honeycomb dressing in place over surgical incision and abdomen is distended. No weight history in chart to trend.  MAP: 63-96 mmHg; not requiring any pressors  Access: 18 Fr. NGT placed 4/2; terminates in mid-stomach per abdominal x-ray 4/2; 53 cm at right nare; currently to suction  Patient is currently intubated on ventilator support MV: 12 L/min Temp (24hrs), Avg:98.6 F (37 C), Min:98.3 F (36.8 C), Max:99.1 F (37.3 C)  Propofol: N/A  Medications reviewed and include: pantoprazole, NS @ 150 mL/hr, fentanyl gtt, Zosyn.  Labs reviewed: CBG 93-117, Sodium 133, Chloride 98, CO2 18, BUN 100,  Creatinine 2.91, Anion gap 17, Ammonia 55.  I/O: 340 mL UOP so far today; no output from NGT documented so far today  Patient does not meet criteria for malnutrition.  Discussed with RN and on rounds.  NUTRITION - FOCUSED PHYSICAL EXAM:    Most Recent Value  Orbital Region  No depletion  Upper Arm Region  No depletion  Thoracic and Lumbar Region  No depletion  Buccal Region  Unable to assess  Temple Region  No depletion  Clavicle Bone Region  No depletion  Clavicle and Acromion Bone Region  No depletion  Scapular Bone Region  Unable to assess  Dorsal Hand  No depletion  Patellar Region  No depletion  Anterior Thigh Region  No depletion  Posterior Calf Region  No depletion  Edema (RD Assessment)  Mild  Hair  Reviewed  Eyes  Unable to assess  Mouth  Unable to assess  Skin  Reviewed  Nails  Reviewed     Diet Order:  No diet orders on file  EDUCATION NEEDS:   No education needs have been identified at this time  Skin:  Skin Assessment: Skin Integrity Issues: Skin Integrity Issues:: Incisions Incisions: closed incision to abdomen  Last BM:  Unknown  Height:   Ht Readings from Last 1 Encounters:  08/05/17 5\' 9"  (1.753 m)    Weight:   Wt Readings from Last 1 Encounters:  08/05/17 210 lb 8.6 oz (95.5 kg)    Ideal Body Weight:  72.7 kg  BMI:  Body mass index is 31.09 kg/m.  Estimated Nutritional Needs:   Kcal:  6213-0865 (11-14 kcal/kg)  Protein:  >/= 145 grams (>/= 2 grams/kg IBW)  Fluid:  1.8-2.1 L/day (25-30 mL/kg IBW)  Helane Rima, MS, RD, LDN Office: [phone] Pager: [phone] After Hours/Weekend Pager: [phone]

## 2017-08-05 NOTE — Consult Note (Signed)
Name: Cory Graham MRN: [id number] DOB: [birth year]    ADMISSION DATE:  08/09/2017 CONSULTATION DATE: 08/05/2017  REFERRING MD : Dr. Ferrel Logan  CHIEF COMPLAINT: AMS  BRIEF PATIENT DESCRIPTION:  57 yo male admitted with bowel perforation s/p exploratory laparotomy and drainage of 2L purulent secretions from abdominal cavity unable to identify perforation remained mechanically intubated postop  SIGNIFICANT EVENTS  04/2-Pt admitted to ICU mechanically intubated s/p exploratory laparotomy   STUDIES:  CT Abd Pelvis 04/1>>Large amount of pneumoperitoneum and small ascites, consistent with bowel perforation. Location of perforation is not definitively identified. CT Head 04/1>>No acute intracranial process. 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  HISTORY OF PRESENT ILLNESS:   This is a 57 yo male with no significant PMH according to pts wife he has not been examined by a physician in 22 years. She does endorse ETOH use in the past, however she is uncertain if he has liver disease.  He presented to [facility] ER 04/1 with abdominal pain, fever, nausea/vomiting, and AMS onset of symptoms 3 days prior to presentation.  Per ER notes according to pts wife he took laxatives due to recent constipation.  In the ER CT Abd Pelvis revealed large amount of pneumoperitoneum consistent with bowel perforation, therefore surgical team consulted.  Pt emergently transported to OR for exploratory laparotomy.  During procedure unable to identify perforation, however drained 2L of purulent secretions from abdominal cavity he remained intubated postop.  He was subsequently admitted to ICU post procedure PCCM consulted for vent management.    PAST MEDICAL HISTORY :   has no past medical history on file.  has no past surgical history on file. Prior to Admission medications   Not on File   Not on File  FAMILY HISTORY:  family history is not on file. SOCIAL HISTORY:    REVIEW  OF SYSTEMS:   Unable to assess pt intubated   SUBJECTIVE:  Unable to assess pt intubated   VITAL SIGNS: Pulse Rate:  [109-114] 111 (04/01 2325) Resp:  [18-28] 18 (04/01 2325) BP: (107-156)/(78-101) 137/101 (04/01 2301) SpO2:  [88 %-100 %] 94 % (04/01 2325) Weight:  [88.9 kg (196 lb)] 88.9 kg (196 lb) (04/01 2017)  PHYSICAL EXAMINATION: General: well developed, well nourished male, NAD mechanically intubated  Neuro: sedated, not following commands, PERRL HEENT: supple, no JVD  Cardiovascular: sinus tach, no R/G Lungs: faint rhonchi throughout, even, non labored  Abdomen: no audile BS x4, obese, soft, non distended Musculoskeletal: normal bulk and tone, no edema  Skin: midline abdominal incision well approximated with honeycomb dressing intact with small amount of blood tinged drainage   Recent Labs  Lab 08/31/2017 2025  NA 133*  K 5.2*  CL 91*  CO2 15*  BUN 96*  CREATININE 3.42*  GLUCOSE 169*   Recent Labs  Lab 08/29/2017 2025  HGB 12.6*  HCT 38.0*  WBC 24.6*  PLT 319   Ct Abdomen Pelvis Wo Contrast  Result Date: 08/22/2017 CLINICAL DATA:  Abdominal pain. EXAM: CT ABDOMEN AND PELVIS WITHOUT CONTRAST TECHNIQUE: Multidetector CT imaging of the abdomen and pelvis was performed following the standard protocol without IV contrast. COMPARISON:  None. FINDINGS: Lower chest: Bibasilar atelectasis. Hepatobiliary: Subcentimeter low-density lesion in the left liver is too small to characterize. Cholelithiasis. No gallbladder wall thickening or biliary dilatation. Pancreas: Unremarkable. No pancreatic ductal dilatation or surrounding inflammatory changes. Spleen: Normal in size without focal abnormality. Adrenals/Urinary Tract: Thickening of the bilateral adrenal glands without discrete nodule. Exophytic, subcentimeter low-density lesion  arising from the left kidney, too small to characterize. No renal or ureteral calculi. Moderate diffuse bladder wall thickening may be related to  underdistention. Stomach/Bowel: Prominent sigmoid diverticulosis. Mild wall thickening of the proximal sigmoid colon. Prominent thinning of the anterior wall of the proximal transverse colon (series 3, image 48). No bowel obstruction. Normal appendix. Vascular/Lymphatic: No significant vascular findings are present. No enlarged abdominal or pelvic lymph nodes. Reproductive: Mild prostatomegaly. Other: Large amount of pneumoperitoneum. Small ascites. Fat containing umbilical and left inguinal hernias. Musculoskeletal: No acute or significant osseous findings. Severe right hip osteoarthritis. IMPRESSION: 1. Large amount of pneumoperitoneum and small ascites, consistent with bowel perforation. Location of perforation is not definitively identified. Critical Value/emergent results were called by telephone at the time of interpretation on 08/30/2017 at 10:29 pm to Dr. Nance Pear , who verbally acknowledged these results. Electronically Signed   By: Titus Dubin M.D.   On: 09/01/2017 22:32   Ct Head Wo Contrast  Result Date: 08/13/2017 CLINICAL DATA:  Altered mental status, abdominal pain. Renal insufficiency. EXAM: CT HEAD WITHOUT CONTRAST TECHNIQUE: Contiguous axial images were obtained from the base of the skull through the vertex without intravenous contrast. COMPARISON:  None. FINDINGS: BRAIN: No intraparenchymal hemorrhage, mass effect nor midline shift. The ventricles and sulci are normal for age. 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 No acute large vascular territory infarcts. No abnormal extra-axial fluid collections. Basal cisterns are patent. VASCULAR: Trace calcific atherosclerosis of the carotid siphons. SKULL: No skull fracture. Old slightly depressed LEFT nasal bone fracture. No significant scalp soft tissue swelling. SINUSES/ORBITS: Trace paranasal sinus mucosal thickening with LEFT maxillary mucosal retention cyst. Mastoid air cells  are well aerated. Soft tissue within the bilateral external auditory canals most compatible with cerumen.The included ocular globes and orbital contents are non-suspicious. OTHER: None. IMPRESSION: 1. No acute intracranial process. 2. 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 Electronically Signed   By: Elon Alas M.D.   On: 08/25/2017 22:20    ASSESSMENT / PLAN: Mechanical Intubation for surgical procedure  Hollow Viscus Perforation s/p exploratory laparotomy  Septic Shock secondary to intra abdominal infection  Acute renal failure with hyperkalemia secondary to septic shock  Lactic Acidosis  Acute blood loss anemia  P: Full vent support for now vent settings reviewed and established  SBT once all parameters met  Prn bronchodilator therapy  ABG and CXR pending  VAP bundle implemented Continuous telemetry monitoring  Stat troponin pending  Maintain map >65  NS _0  ml/hr  Trend WBC and monitor fever curve  Trend PCT and lactic acid  Follow cultures  Continue zosyn  Trend BMP  Replace electrolytes as indicated  Monitor UOP VTE px: subq lovenox  Trend CBC  Monitor for s/sx of bleeding  Transfuse for hgb <7 SUP px: IV protonix Keep NPO defer to surgical team  NG tube to LIS  Maintain RASS goal: 0 to -1 Prn fentanyl gtt to maintain RASS goal and for pain management WUA daily   Marda Stalker, [place] Pager [phone] (please enter 7 digits) PCCM Consult Pager [phone] (please enter 7 digits)

## 2017-08-05 NOTE — Anesthesia Postprocedure Evaluation (Signed)
Anesthesia Post Note  Patient: Cory Graham  Procedure(s) Performed: EXPLORATORY LAPAROTOMY, removal of abdomen, peritoneal lavage, umbilical hernia repair (N/A Abdomen)  Patient location during evaluation: ICU Anesthesia Type: General Level of consciousness: sedated and patient remains intubated per anesthesia plan Vital Signs Assessment: post-procedure vital signs reviewed and stable Respiratory status: patient remains intubated per anesthesia plan and patient on ventilator - see flowsheet for VS Cardiovascular status: stable Postop Assessment: no apparent nausea or vomiting Anesthetic complications: no     Last Vitals:  Vitals:   08/05/17 0500 08/05/17 0600  BP: 123/71 (!) 142/63  Pulse: (!) 111 (!) 109  Resp: 19 20  Temp:    SpO2: 98% 98%    Last Pain:  Vitals:   08/05/17 0400  TempSrc: Oral  PainSc:                  Rica MastBachich,  Teletha Petrea M

## 2017-08-05 NOTE — Anesthesia Preprocedure Evaluation (Signed)
Anesthesia Evaluation  Patient identified by MRN, date of birth, ID band Patient confused  General Assessment Comment:Limited hx obtained from wife, pt not responsive to questions or commands  Reviewed: Allergy & Precautions, NPO status , Patient's Chart, lab work & pertinent test results  History of Anesthesia Complications Negative for: history of anesthetic complications  Airway   TM Distance: >3 FB    Comment: Unable to assess, pt not responsive to commands Dental  (+) Poor Dentition, Missing   Pulmonary neg pulmonary ROS, neg sleep apnea, neg COPD,    - rhonchi + wheezing      Cardiovascular (-) hypertension(-) CAD, (-) Past MI, (-) Cardiac Stents and (-) CABG  Rhythm:Regular Rate:Normal - Systolic murmurs and - Diastolic murmurs    Neuro/Psych negative neurological ROS  negative psych ROS   GI/Hepatic negative GI ROS, Neg liver ROS,   Endo/Other  negative endocrine ROSneg diabetes  Renal/GU negative Renal ROS     Musculoskeletal negative musculoskeletal ROS (+)   Abdominal   Peds  Hematology negative hematology ROS (+)   Anesthesia Other Findings No known medical hx, has not seen a physician in >20 yrs  Reproductive/Obstetrics                             Anesthesia Physical Anesthesia Plan  ASA: II and emergent  Anesthesia Plan: General   Post-op Pain Management:    Induction: Intravenous  PONV Risk Score and Plan: 1 and Ondansetron  Airway Management Planned: Oral ETT  Additional Equipment:   Intra-op Plan:   Post-operative Plan: Possible Post-op intubation/ventilation  Informed Consent: I have reviewed the patients History and Physical, chart, labs and discussed the procedure including the risks, benefits and alternatives for the proposed anesthesia with the patient or authorized representative who has indicated his/her understanding and acceptance.   Dental advisory  given  Plan Discussed with: CRNA and Anesthesiologist  Anesthesia Plan Comments: (Plan to remain intubated and transferred to ICU post op)        Anesthesia Quick Evaluation

## 2017-08-05 NOTE — Anesthesia Procedure Notes (Signed)
Procedure Name: Intubation Date/Time: 09/02/2017 11:55 PM Performed by: Waldo LaineJustis, Hipolito Martinezlopez, CRNA Pre-anesthesia Checklist: Patient identified, Patient being monitored, Timeout performed, Emergency Drugs available and Suction available Patient Re-evaluated:Patient Re-evaluated prior to induction Oxygen Delivery Method: Circle system utilized Preoxygenation: Pre-oxygenation with 100% oxygen Induction Type: IV induction and Rapid sequence Laryngoscope Size: McGraph Grade View: Grade I Tube type: Oral Tube size: 7.5 mm Number of attempts: 1 Airway Equipment and Method: Stylet Placement Confirmation: ETT inserted through vocal cords under direct vision,  positive ETCO2 and breath sounds checked- equal and bilateral Secured at: 21 cm Tube secured with: Tape Dental Injury: Teeth and Oropharynx as per pre-operative assessment

## 2017-08-05 NOTE — Progress Notes (Signed)
08/05/2017  Subjective: Patient is 1 Day Post-Op s/p exploratory laparotomy.  No source of perforation was found.  Overnight, patient has remained acidotic though has not required any pressors.  Vital signs: Temp:  [98.3 F (36.8 C)] 98.3 F (36.8 C) (04/02 0400) Pulse Rate:  [109-115] 109 (04/02 0600) Resp:  [18-28] 20 (04/02 0600) BP: (107-156)/(63-101) 142/63 (04/02 0600) SpO2:  [88 %-100 %] 99 % (04/02 0740) FiO2 (%):  [50 %] 50 % (04/02 0740) Weight:  [196 lb (88.9 kg)-210 lb 8.6 oz (95.5 kg)] 210 lb 8.6 oz (95.5 kg) (04/02 0400)   Intake/Output: 04/01 0701 - 04/02 0700 In: 2750 [I.V.:2700; IV Piggyback:50] Out: 725 [Urine:625; Blood:100] Last BM Date: (unknown )  Physical Exam: Constitutional: Sedated, intubated Cardiac:  Low grade tachycardia Pulm:  On vent, PRVC 20x500, 5 PEEP, 50% Abdomen:  Soft, mildly distended, with midline incision clean, dry, intact.  Staples in place.  Honeycomb dressing in place.  NG tube in place with small bowel contents  Labs:  Recent Labs    08/23/2017 2025  WBC 24.6*  HGB 12.6*  HCT 38.0*  PLT 319   Recent Labs    08/25/2017 2025 08/05/17 0516  NA 133* 133*  K 5.2* 5.0  CL 91* 98*  CO2 15* 18*  GLUCOSE 169* 135*  BUN 96* 100*  CREATININE 3.42* 2.91*  CALCIUM 8.6* 7.2*   No results for input(s): LABPROT, INR in the last 72 hours.  Imaging: Ct Abdomen Pelvis Wo Contrast  Result Date: 08/08/2017 CLINICAL DATA:  Abdominal pain. EXAM: CT ABDOMEN AND PELVIS WITHOUT CONTRAST TECHNIQUE: Multidetector CT imaging of the abdomen and pelvis was performed following the standard protocol without IV contrast. COMPARISON:  None. FINDINGS: Lower chest: Bibasilar atelectasis. Hepatobiliary: Subcentimeter low-density lesion in the left liver is too small to characterize. Cholelithiasis. No gallbladder wall thickening or biliary dilatation. Pancreas: Unremarkable. No pancreatic ductal dilatation or surrounding inflammatory changes. Spleen: Normal in  size without focal abnormality. Adrenals/Urinary Tract: Thickening of the bilateral adrenal glands without discrete nodule. Exophytic, subcentimeter low-density lesion arising from the left kidney, too small to characterize. No renal or ureteral calculi. Moderate diffuse bladder wall thickening may be related to underdistention. Stomach/Bowel: Prominent sigmoid diverticulosis. Mild wall thickening of the proximal sigmoid colon. Prominent thinning of the anterior wall of the proximal transverse colon (series 3, image 48). No bowel obstruction. Normal appendix. Vascular/Lymphatic: No significant vascular findings are present. No enlarged abdominal or pelvic lymph nodes. Reproductive: Mild prostatomegaly. Other: Large amount of pneumoperitoneum. Small ascites. Fat containing umbilical and left inguinal hernias. Musculoskeletal: No acute or significant osseous findings. Severe right hip osteoarthritis. IMPRESSION: 1. Large amount of pneumoperitoneum and small ascites, consistent with bowel perforation. Location of perforation is not definitively identified. Critical Value/emergent results were called by telephone at the time of interpretation on 09/01/2017 at 10:29 pm to Dr. Phineas SemenGRAYDON GOODMAN , who verbally acknowledged these results. Electronically Signed   By: Obie DredgeWilliam T Derry M.D.   On: 08/18/2017 22:32   Dg Abd 1 View  Result Date: 08/05/2017 CLINICAL DATA:  57 year old male status post NGT placement. EXAM: PORTABLE CHEST 1 VIEW COMPARISON:  Abdominal CT dated 08/23/2017 FINDINGS: An endotracheal tube is seen with tip approximately 3 cm above the carina. An enteric tube extends into the left hemiabdomen with tip likely in the mid stomach. Shallow inspiration with bibasilar atelectatic changes. Pneumonia is not excluded. No large pleural effusion. There is no pneumothorax. Evaluation of the abdomen is limited on the provided images and secondary  to body habitus. There degenerative changes of the spine. No acute osseous  pathology. IMPRESSION: 1. Endotracheal tube above the carina and enteric tube likely in the stomach. 2. Shallow inspiration with bibasilar atelectasis/infiltrate. Electronically Signed   By: Elgie Collard M.D.   On: 08/05/2017 04:41   Ct Head Wo Contrast  Result Date: 08/07/2017 CLINICAL DATA:  Altered mental status, abdominal pain. Renal insufficiency. EXAM: CT HEAD WITHOUT CONTRAST TECHNIQUE: Contiguous axial images were obtained from the base of the skull through the vertex without intravenous contrast. COMPARISON:  None. FINDINGS: BRAIN: No intraparenchymal hemorrhage, mass effect nor midline shift. The ventricles and sulci are normal for age. 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 No acute large vascular territory infarcts. No abnormal extra-axial fluid collections. Basal cisterns are patent. VASCULAR: Trace calcific atherosclerosis of the carotid siphons. SKULL: No skull fracture. Old slightly depressed LEFT nasal bone fracture. No significant scalp soft tissue swelling. SINUSES/ORBITS: Trace paranasal sinus mucosal thickening with LEFT maxillary mucosal retention cyst. Mastoid air cells are well aerated. Soft tissue within the bilateral external auditory canals most compatible with cerumen.The included ocular globes and orbital contents are non-suspicious. OTHER: None. IMPRESSION: 1. No acute intracranial process. 2. 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 Electronically Signed   By: Awilda Metro M.D.   On: 08/20/2017 22:20   Dg Chest Port 1 View  Result Date: 08/05/2017 CLINICAL DATA:  57 year old male status post NGT placement. EXAM: PORTABLE CHEST 1 VIEW COMPARISON:  Abdominal CT dated 08/26/2017 FINDINGS: An endotracheal tube is seen with tip approximately 3 cm above the carina. An enteric tube extends into the left hemiabdomen with tip likely in the mid stomach. Shallow inspiration  with bibasilar atelectatic changes. Pneumonia is not excluded. No large pleural effusion. There is no pneumothorax. Evaluation of the abdomen is limited on the provided images and secondary to body habitus. There degenerative changes of the spine. No acute osseous pathology. IMPRESSION: 1. Endotracheal tube above the carina and enteric tube likely in the stomach. 2. Shallow inspiration with bibasilar atelectasis/infiltrate. Electronically Signed   By: Elgie Collard M.D.   On: 08/05/2017 04:41    Assessment/Plan: 57 yo male s/p exploratory laparotomy for pneumoperitoneum, without identified source  --Patient remains critical with significant acidosis.  Continue aggressive IV fluid resuscitation to help with acidosis --continue IV antibiotics empirically given significant amount of purulent fluid in abdomen intraoperatively --continue NG tube suction --monitor LFTs and ammonia level --DVT and GI prophylaxis   Howie Ill, MD [facility]

## 2017-08-05 NOTE — Progress Notes (Signed)
Rate incr to 20 after ABG

## 2017-08-05 NOTE — Op Note (Signed)
Preoperative diagnosis: Hollow viscus perforation  Postoperative diagnosis: Pneumoperitoneum, umbilical hernia  Procedure: Exploratory Laparotomy                      Peritoneal lavage                      Umbilical hernia repair  Anesthesia: GETA  Surgeon: Dr. Hazle Quantintron Diaz, MD  Assistant Surgeon: Dr. Cathlean MarseillesJ. Byrnett  Wound Classification: Contaminated  Indications:  Patient is a 57 y.o. male with abdominal pain of three day history with CT scan showing abundant pneumoperitoneum and moderate free fluid. Exploratory laparotomy was indicated.   Findings:  1. Two liters of purulent secretions drained from abdominal cavity 2. GI tract inspected from Gastroesophageal junction to rectum and no perforation was identified.  3.  Adequate Hemostasis  Description of procedure: The patient was placed in the supine position and general endotracheal anesthesia was induced. Preoperative antibiotics were given. A Foley catheter and nasogastric tube were placed. The abdomen was prepped and draped in the usual sterile fashion. A time-out was completed verifying correct patient, procedure, site, positioning, and implant(s) and/or special equipment prior to beginning this procedure. A midline incision from xyphoid to pubis was made from. This was deepened through the subcutaneous tissues and hemostasis was achieved with electrocautery. The linea alba was identified and incised and the peritoneal cavity entered sharply. Abundant purulent drainage was identified, cultured and drained. The abdomen was explored. Adhesions were lysed sharply under direct vision with Metzenbaum scissors. First, the small bowel was released from fibrinous adhesions and additional purulent collection were drained. Small bowel inspected from Ligament of Treitz to the ileocecal valve and no perforation seen. Appendix and large bowel also mobilized and inspected down to the rectum and no perforation identified. Stomach inspected from anterior  wall and posterior wall in the lesser sac and despite abundant pus and fibrin tissue, no perforation seen. Kocher done to mobilized the duodenum, which looks healthy without signs of perforation. The abdominal cavity was irrigated with >3 liter of warm saline. After irrigation, with the assistance of Dr. Lemar LivingsByrnett due to the complexity of the case, the GI tract was again inspected from stomach to rectum and no perforation was seen. Irrigation again done until clear water was aspirated. The umbilicus was then assessed and the hernia was opened and sac reduced and dissected. Midline fascia closed with PDS 1. Skin closed with staples.   The patient tolerated the procedure well without the need of vasopressor but due to pre operative altered mental status and respiratory distress and the amount of infection identified on surgery, the decision to remained on mechanical ventilation was done.   Specimen: Omentum  Complications: None  Estimated Blood Loss: 100mL

## 2017-08-05 NOTE — Progress Notes (Addendum)
IVF bolus given per verbal order from Dr. Duanne LimerickSamaan. MD aware of elevated LA. Trudee KusterBrandi R Mansfield

## 2017-08-05 NOTE — Anesthesia Post-op Follow-up Note (Signed)
Anesthesia QCDR form completed.        

## 2017-08-05 NOTE — Progress Notes (Signed)
   Name: Cory Graham MRN: [id number] DOB: 27-Mar-1961     CONSULTATION DATE: 08/10/2017 Subjective: No major issues last night  Objectives:  POD #1 exploratory laparotomy remains on the vent.   has no past medical history on file.  has no past surgical history on file. Prior to Admission medications   Not on File   Not on File  FAMILY HISTORY:  family history is not on file. SOCIAL HISTORY:    REVIEW OF SYSTEMS:   Unable to obtain due to critical illness   VITAL SIGNS: Temp:  [98.3 F (36.8 C)] 98.3 F (36.8 C) (04/02 0400) Pulse Rate:  [109-115] 109 (04/02 0600) Resp:  [18-28] 20 (04/02 0600) BP: (107-156)/(63-101) 142/63 (04/02 0600) SpO2:  [88 %-100 %] 99 % (04/02 0740) FiO2 (%):  [50 %] 50 % (04/02 0740) Weight:  [88.9 kg (196 lb)-95.5 kg (210 lb 8.6 oz)] 95.5 kg (210 lb 8.6 oz) (04/02 0400)  Physical Examination:  Sedated to RASS of -3 On the vent in no distress, equal air entry and no adventitious sounds S1 + S2 audible with no murmur Status post laparotomy.  Tense abdomen and no peristalsis No peripheral edema  ASSESSMENT / PLAN:   Acute respiratory failure remained intubated postoperatively because of poor weaning parameters. -Monitor ABG, optimize vent settings, SAT + SBT tolerated and consider weaning parameters   Atelectasis and pneumonia.  Bibasilar airspace disease -Empiric Zosyn.  Monitor CXR + CBC + FiO2 continue with respiratory care.  POD #1 separation laparotomy for pneumoperitoneum perforation.  Unable to identify source of perforation. -Management as per surgery  Sepsis with lactic acidemia [improved] -Empiric Zosyn cultures remain negative -Monitor lactic acid level  Elevated liver enzymes and hyperammonemia with sepsis -Monitor LFTs, avoid hepatotoxins, consider using lactulose once able to have enteral feeding.  Full code  DVT and GI prophylaxis.  Continue with supportive care.

## 2017-08-06 ENCOUNTER — Inpatient Hospital Stay: Payer: Medicaid Other

## 2017-08-06 DIAGNOSIS — A419 Sepsis, unspecified organism: Principal | ICD-10-CM

## 2017-08-06 DIAGNOSIS — J9811 Atelectasis: Secondary | ICD-10-CM

## 2017-08-06 DIAGNOSIS — J9601 Acute respiratory failure with hypoxia: Secondary | ICD-10-CM

## 2017-08-06 LAB — CBC WITH DIFFERENTIAL/PLATELET
BAND NEUTROPHILS: 18 %
BASOS PCT: 0 %
BASOS PCT: 0 %
Band Neutrophils: 26 %
Basophils Absolute: 0 10*3/uL (ref 0–0.1)
Basophils Absolute: 0 10*3/uL (ref 0–0.1)
Blasts: 0 %
Blasts: 0 %
EOS PCT: 0 %
EOS PCT: 0 %
Eosinophils Absolute: 0 10*3/uL (ref 0–0.7)
Eosinophils Absolute: 0 10*3/uL (ref 0–0.7)
HCT: 29 % — ABNORMAL LOW (ref 40.0–52.0)
HCT: 23.7 % — ABNORMAL LOW (ref 40.0–52.0)
Hemoglobin: 9 g/dL — ABNORMAL LOW (ref 13.0–18.0)
Hemoglobin: 7.5 g/dL — ABNORMAL LOW (ref 13.0–18.0)
LYMPHS ABS: 1.1 10*3/uL (ref 1.0–3.6)
LYMPHS ABS: 2.6 10*3/uL (ref 1.0–3.6)
Lymphocytes Relative: 7 %
Lymphocytes Relative: 23 %
MCH: 23.3 pg — ABNORMAL LOW (ref 26.0–34.0)
MCH: 23.4 pg — AB (ref 26.0–34.0)
MCHC: 30.9 g/dL — AB (ref 32.0–36.0)
MCHC: 31.4 g/dL — AB (ref 32.0–36.0)
MCV: 75.4 fL — ABNORMAL LOW (ref 80.0–100.0)
MCV: 74.4 fL — AB (ref 80.0–100.0)
METAMYELOCYTES PCT: 0 %
METAMYELOCYTES PCT: 3 %
MONO ABS: 1.5 10*3/uL — AB (ref 0.2–1.0)
MONO ABS: 1.3 10*3/uL — AB (ref 0.2–1.0)
MONOS PCT: 10 %
MONOS PCT: 12 %
MYELOCYTES: 1 %
Myelocytes: 0 %
NEUTROS ABS: 7.2 10*3/uL — AB (ref 1.4–6.5)
NEUTROS PCT: 35 %
NRBC: 0 /100{WBCs}
Neutro Abs: 12.4 10*3/uL — ABNORMAL HIGH (ref 1.4–6.5)
Neutrophils Relative %: 65 %
OTHER: 0 %
Other: 0 %
PLATELETS: 155 10*3/uL (ref 150–440)
Platelets: 188 10*3/uL (ref 150–440)
Promyelocytes Absolute: 0 %
Promyelocytes Absolute: 0 %
RBC: 3.85 MIL/uL — ABNORMAL LOW (ref 4.40–5.90)
RBC: 3.19 MIL/uL — AB (ref 4.40–5.90)
RDW: 18.4 % — ABNORMAL HIGH (ref 11.5–14.5)
RDW: 18.2 % — ABNORMAL HIGH (ref 11.5–14.5)
SMEAR REVIEW: ADEQUATE
WBC: 15 10*3/uL — ABNORMAL HIGH (ref 3.8–10.6)
WBC: 11.1 10*3/uL — ABNORMAL HIGH (ref 3.8–10.6)
nRBC: 1 /100 WBC — ABNORMAL HIGH

## 2017-08-06 LAB — LACTIC ACID, PLASMA
Lactic Acid, Venous: 1.3 mmol/L (ref 0.5–1.9)
Lactic Acid, Venous: 1.4 mmol/L (ref 0.5–1.9)

## 2017-08-06 LAB — COMPREHENSIVE METABOLIC PANEL
ALT: 29 U/L (ref 17–63)
ANION GAP: 12 (ref 5–15)
AST: 55 U/L — ABNORMAL HIGH (ref 15–41)
Albumin: 1.7 g/dL — ABNORMAL LOW (ref 3.5–5.0)
Alkaline Phosphatase: 138 U/L — ABNORMAL HIGH (ref 38–126)
BUN: 111 mg/dL — ABNORMAL HIGH (ref 6–20)
CHLORIDE: 108 mmol/L (ref 101–111)
CO2: 17 mmol/L — AB (ref 22–32)
CREATININE: 3.55 mg/dL — AB (ref 0.61–1.24)
Calcium: 6.6 mg/dL — ABNORMAL LOW (ref 8.9–10.3)
GFR, EST AFRICAN AMERICAN: 20 mL/min — AB (ref >60)
GFR, EST NON AFRICAN AMERICAN: 17 mL/min — AB (ref >60)
Glucose, Bld: 124 mg/dL — ABNORMAL HIGH (ref 65–99)
POTASSIUM: 5.3 mmol/L — AB (ref 3.5–5.1)
SODIUM: 137 mmol/L (ref 135–145)
Total Bilirubin: 2.7 mg/dL — ABNORMAL HIGH (ref 0.3–1.2)
Total Protein: 4.6 g/dL — ABNORMAL LOW (ref 6.5–8.1)

## 2017-08-06 LAB — PROCALCITONIN: Procalcitonin: 34.92 ng/mL

## 2017-08-06 LAB — GLUCOSE, CAPILLARY
GLUCOSE-CAPILLARY: 107 mg/dL — AB (ref 65–99)
GLUCOSE-CAPILLARY: 108 mg/dL — AB (ref 65–99)
GLUCOSE-CAPILLARY: 122 mg/dL — AB (ref 65–99)
GLUCOSE-CAPILLARY: 186 mg/dL — AB (ref 65–99)
GLUCOSE-CAPILLARY: 201 mg/dL — AB (ref 65–99)
Glucose-Capillary: 110 mg/dL — ABNORMAL HIGH (ref 65–99)
Glucose-Capillary: 153 mg/dL — ABNORMAL HIGH (ref 65–99)

## 2017-08-06 LAB — BASIC METABOLIC PANEL
Anion gap: 11 (ref 5–15)
BUN: 115 mg/dL — AB (ref 6–20)
CHLORIDE: 109 mmol/L (ref 101–111)
CO2: 17 mmol/L — AB (ref 22–32)
Calcium: 6.8 mg/dL — ABNORMAL LOW (ref 8.9–10.3)
Creatinine, Ser: 3.72 mg/dL — ABNORMAL HIGH (ref 0.61–1.24)
GFR calc non Af Amer: 16 mL/min — ABNORMAL LOW (ref >60)
GFR, EST AFRICAN AMERICAN: 19 mL/min — AB (ref >60)
Glucose, Bld: 134 mg/dL — ABNORMAL HIGH (ref 65–99)
POTASSIUM: 5.1 mmol/L (ref 3.5–5.1)
Sodium: 137 mmol/L (ref 135–145)

## 2017-08-06 LAB — PROTIME-INR
INR: 1.37
Prothrombin Time: 16.8 seconds — ABNORMAL HIGH (ref 11.4–15.2)

## 2017-08-06 LAB — AMMONIA: Ammonia: 68 umol/L — ABNORMAL HIGH (ref 9–35)

## 2017-08-06 LAB — MAGNESIUM
MAGNESIUM: 2.3 mg/dL (ref 1.7–2.4)
MAGNESIUM: 2.7 mg/dL — AB (ref 1.7–2.4)

## 2017-08-06 LAB — PHOSPHORUS
PHOSPHORUS: 7 mg/dL — AB (ref 2.5–4.6)
PHOSPHORUS: 7.4 mg/dL — AB (ref 2.5–4.6)

## 2017-08-06 LAB — TRIGLYCERIDES: Triglycerides: 188 mg/dL — ABNORMAL HIGH (ref <150)

## 2017-08-06 LAB — APTT: APTT: 32 s (ref 24–36)

## 2017-08-06 MED ORDER — SODIUM CHLORIDE 0.9 % IV BOLUS
1000.0000 mL | Freq: Once | INTRAVENOUS | Status: AC
  Administered 2017-08-06: 1000 mL via INTRAVENOUS

## 2017-08-06 MED ORDER — DEXMEDETOMIDINE HCL IN NACL 400 MCG/100ML IV SOLN
0.4000 ug/kg/h | INTRAVENOUS | Status: DC
  Administered 2017-08-06: 0.4 ug/kg/h via INTRAVENOUS
  Filled 2017-08-06: qty 100

## 2017-08-06 MED ORDER — HEPARIN SODIUM (PORCINE) 1000 UNIT/ML IJ SOLN
1000.0000 [IU] | INTRAMUSCULAR | Status: DC | PRN
  Administered 2017-08-06: 2800 [IU] via INTRAVENOUS
  Filled 2017-08-06: qty 3

## 2017-08-06 MED ORDER — NOREPINEPHRINE 4 MG/250ML-% IV SOLN
0.0000 ug/min | INTRAVENOUS | Status: DC
  Administered 2017-08-06: 2 ug/min via INTRAVENOUS
  Filled 2017-08-06: qty 250

## 2017-08-06 MED ORDER — NOREPINEPHRINE BITARTRATE 1 MG/ML IV SOLN
0.0000 ug/min | INTRAVENOUS | Status: DC
  Administered 2017-08-06: 25 ug/min via INTRAVENOUS
  Administered 2017-08-07: 20 ug/min via INTRAVENOUS
  Administered 2017-08-08: 7 ug/min via INTRAVENOUS
  Administered 2017-08-11: 3 ug/min via INTRAVENOUS
  Filled 2017-08-06 (×5): qty 16

## 2017-08-06 MED ORDER — DEXTROSE 5 % IV SOLN
INTRAVENOUS | Status: DC
  Administered 2017-08-06 – 2017-08-07 (×3): via INTRAVENOUS
  Filled 2017-08-06 (×4): qty 150

## 2017-08-06 MED ORDER — ALBUMIN HUMAN 25 % IV SOLN
50.0000 g | Freq: Once | INTRAVENOUS | Status: AC
  Administered 2017-08-06: 50 g via INTRAVENOUS
  Filled 2017-08-06: qty 200

## 2017-08-06 MED ORDER — PROPOFOL 1000 MG/100ML IV EMUL
5.0000 ug/kg/min | INTRAVENOUS | Status: DC
  Administered 2017-08-06: 10 ug/kg/min via INTRAVENOUS
  Administered 2017-08-07: 20 ug/kg/min via INTRAVENOUS
  Filled 2017-08-06 (×2): qty 100

## 2017-08-06 MED ORDER — SODIUM CHLORIDE 0.9 % IV BOLUS
500.0000 mL | Freq: Once | INTRAVENOUS | Status: AC
  Administered 2017-08-06: 500 mL via INTRAVENOUS

## 2017-08-06 MED ORDER — ACETAMINOPHEN 650 MG RE SUPP
650.0000 mg | Freq: Once | RECTAL | Status: AC
  Administered 2017-08-06: 650 mg via RECTAL
  Filled 2017-08-06: qty 1

## 2017-08-06 MED ORDER — ACETAMINOPHEN 325 MG PO TABS
ORAL_TABLET | ORAL | Status: AC
  Filled 2017-08-06: qty 2

## 2017-08-06 NOTE — Progress Notes (Addendum)
   Name: Cory Graham MRN: [id number] DOB: 1960/07/21     CONSULTATION DATE: 08/10/2017  Remains on vent and no major issues last night  PAST MEDICAL HISTORY :   has no past medical history on file.  has a past surgical history that includes laparotomy (N/A, 08/22/2017). Prior to Admission medications   Not on File   Not on File  FAMILY HISTORY:  family history is not on file. SOCIAL HISTORY:    REVIEW OF SYSTEMS:   Unable to obtain due to critical illness   VITAL SIGNS: Temp:  [98.3 F (36.8 C)-101 F (38.3 C)] 101 F (38.3 C) (04/03 0800) Pulse Rate:  [94-112] 98 (04/03 0830) Resp:  [13-27] 22 (04/03 0830) BP: (79-105)/(50-65) 92/52 (04/03 0830) SpO2:  [94 %-99 %] 97 % (04/03 0844) FiO2 (%):  [40 %] 40 % (04/03 0844)  Physical Examination:  Sedated to RASS of -3. Precedex + Fentanyl On the vent in no distress, equal air entry and no adventitious sounds S1 + S2 audible with no murmur Status post laparotomy.  Tense abdomen and no peristalsis No peripheral edema  ASSESSMENT / PLAN:  Acute respiratory failure remained intubated postoperatively because of poor weaning parameters. -Monitor ABG, optimize vent settings, SAT + SBT tolerated and consider weaning parameters  Atelectasis and pneumonia.  Bibasilar airspace disease -Empiric Zosyn.  Monitor CXR + CBC + FiO2 continue with respiratory care.  Hypotension with sepsis. mesd and volume depletion. -Optimize volume, consider pressers, monitor hemodynamics.  AKI with sepsis, acidosis, hyperkalemia and ATN -Optimize hemodynamics, avoid nephrotoxins, monitor renal function and urine out put. -Start on Bicarb based solution and follow with renal consult.  POD #2 separation laparotomy for pneumoperitoneum perforation.  Unable to identify source of perforation. -Management as per surgery  Sepsis with lactic acidemia [improved]. Afebrile, WBC and lactic acid trending down. -Empiric Zosyn cultures remain  negative -Monitor lactic acid level  Elevated liver enzymes and hyperammonemia (worsening)  with sepsis -Monitor LFTs, avoid hepatotoxins, consider using lactulose once able to have enteral feeding.  Full code  DVT and GI prophylaxis.  Continue with supportive care.  Critical care time 35 min

## 2017-08-06 NOTE — Progress Notes (Signed)
08/06/2017  Subjective: Patient is 2 Days Post-Op s/p exploratory laparotomy and washout.  Patient's blood pressure remains soft and required bolus overnight.  Making better urine output.  Fentanyl turned off to assess breathing.  Started precedex for agitation.   Vital signs: Temp:  [98.3 F (36.8 C)-101 F (38.3 C)] 98.8 F (37.1 C) (04/03 1149) Pulse Rate:  [84-112] 84 (04/03 1149) Resp:  [13-26] 22 (04/03 1149) BP: (79-105)/(50-65) 82/51 (04/03 1000) SpO2:  [94 %-99 %] 97 % (04/03 1149) FiO2 (%):  [40 %] 40 % (04/03 1132)   Intake/Output: 04/02 0701 - 04/03 0700 In: 5784.66045.2 [I.V.:3995.2; IV Piggyback:2050] Out: 1630 [Urine:930; Emesis/NG output:700] Last BM Date: (unknown)  Physical Exam: Constitutional:  No acute distress Cardiac:  Continued low grade tachycardia Pulm:  On vent, with decreased breath sounds bilaterally Abdomen:  Soft, nondistended, obese.  Midline incision with staples clean,dry, with honeycomb dressing.  NG in place with gastric contents. GU:  Foley in place  Labs:  Recent Labs    08/05/17 1916 08/06/17 0559  WBC 15.0* 11.1*  HGB 9.0* 7.5*  HCT 29.0* 23.7*  PLT 188 155   Recent Labs    08/05/17 1916 08/06/17 0559  NA 136 137  K 5.8* 5.3*  CL 104 108  CO2 16* 17*  GLUCOSE 107* 124*  BUN 100* 111*  CREATININE 3.40* 3.55*  CALCIUM 6.5* 6.6*   No results for input(s): LABPROT, INR in the last 72 hours.  Imaging: Dg Chest Port 1 View  Result Date: 08/06/2017 CLINICAL DATA:  Hypoxia EXAM: PORTABLE CHEST 1 VIEW COMPARISON:  August 05, 2017 FINDINGS: Endotracheal tube tip is 2.4 cm above the carina. Nasogastric tube tip and side port are in the stomach. No pneumothorax. There is bibasilar atelectasis. Lungs elsewhere are clear. Heart is upper normal in size with pulmonary vascularity within normal limits. No evident adenopathy. No bone lesions. IMPRESSION: Tube positions as described without pneumothorax. Bibasilar atelectasis. Stable cardiac  silhouette. Electronically Signed   By: Bretta BangWilliam  Woodruff III M.D.   On: 08/06/2017 07:41    Assessment/Plan: 57 yo male s/p exploratory laparotomy for bowel perforation, without being able to find source.  --Patient remains critical.  His acidosis has improved on the vent and with fluid and his lactic acid is now normal.  However, his renal function continues to deteriorate and is having some electrolyte dysfunction due to this. --Neuro:  Wean sedation as possible --Pulm:  Wean vent as possible --Cardiac:  No pressors needed so far.  Continue IV fluid and bolus as needed.  Agree with using albumin as well per nephrology recs --GI:  Continue NG tube to suction.  No plans for removal yet and he is likely to have a prolonged ileus from his critical condition, sepsis, and purulent fluid in his abdomen.  He likely would need TPN first.   --GU:  Continue foley catheter for strict UOP measurement.  Nephrology on board given his renal failure.   --ID:  Continue Zosyn for his bowel perforation.  Would not be surprised if he develops intra-abdominal abscesses given the purulent fluid intraop.  May need CT scan in the future if his WBC worsens or has fevers. --GI and DVT prophylaxis.     Howie IllJose Luis Codie Hainer, MD [facility]

## 2017-08-06 NOTE — Consult Note (Signed)
[facility]  CONSULT NOTE    Date: 08/06/2017                  Patient Name:  Cory Graham  MRN: [id number]  DOB: 01/05/1961  Age / Sex: 57 y.o., male         PCP: Patient, No Pcp Per                 Service Requesting Consult: Dr. Soyla Murphy                 Reason for Consult: Acute Renal Failure            History of Present Illness: Mr. Cory Graham is a 57 y.o. Hispanic male with no significant past medical history, who was admitted to [facility] on 08/15/2017 for Free intraperitoneal air [K66.8] Abdominal pain, unspecified abdominal location [R10.9]   One week of altered mental status. History taken from chart. No contrast exposure.   Patient had an exploratory laparotomy on 4/1 by Dr. Windell Moment where no source of pneumoperitoneum was identified. Umbilical hernia repair and peritoneal lavage done. Multiple species found on gram stain. Empirically on zosyn.   Nephrology consulted due to acute renal failure with hyperkalemia and metabolic acidosis.   Medications: Outpatient medications: No medications prior to admission.    Current medications: Current Facility-Administered Medications  Medication Dose Route Frequency Provider Last Rate Last Dose  . acetaminophen (TYLENOL) 325 MG tablet           . albumin human 25 % solution 50 g  50 g Intravenous Once Samaan, Maged, MD      . chlorhexidine gluconate (MEDLINE KIT) (PERIDEX) 0.12 % solution 15 mL  15 mL Mouth Rinse BID Soyla Murphy, Maged, MD   15 mL at 08/06/17 0757  . dexmedetomidine (PRECEDEX) 400 MCG/100ML (4 mcg/mL) infusion  0.4-1.2 mcg/kg/hr Intravenous Titrated Awilda Bill, NP 4.8 mL/hr at 08/06/17 0837 0.2 mcg/kg/hr at 08/06/17 0837  . enoxaparin (LOVENOX) injection 30 mg  30 mg Subcutaneous Q24H Herbert Pun, MD   30 mg at 08/06/17 0337  . fentaNYL (SUBLIMAZE) bolus via infusion 50 mcg  50 mcg Intravenous Q1H PRN Awilda Bill, NP   50 mcg at 08/06/17 0915  . fentaNYL 2516mg in NS  2558m(1012mml) infusion-PREMIX  25-400 mcg/hr Intravenous Continuous BlaAwilda BillP 12.5 mL/hr at 08/06/17 0937 125 mcg/hr at 08/06/17 0937  . MEDLINE mouth rinse  15 mL Mouth Rinse 10 times per day SamCassandria SanteeD   15 mL at 08/06/17 0938  . midazolam (VERSED) injection 2 mg  2 mg Intravenous Q15 min PRN BlaAwilda BillP   2 mg at 08/05/17 1635  . midazolam (VERSED) injection 2 mg  2 mg Intravenous Q2H PRN BlaAwilda BillP   2 mg at 08/06/17 0936  . morphine 4 MG/ML injection 4 mg  4 mg Intravenous Q3H PRN CinHerbert PunD      . pantoprazole (PROTONIX) injection 40 mg  40 mg Intravenous QHS CinHerbert PunD   40 mg at 08/05/17 2156  . piperacillin-tazobactam (ZOSYN) IVPB 3.375 g  3.375 g Intravenous Q8H CinHerbert PunD   Stopped at 08/06/17 0900  . sodium bicarbonate 150 mEq in dextrose 5 % 1,000 mL infusion   Intravenous Continuous Ilanna Deihl, MD 100 mL/hr at 08/06/17 1027    . sodium chloride 0.9 % bolus 1,000 mL  1,000 mL Intravenous Once SamCassandria SanteeD 1,000 mL/hr at 08/06/17 0937 1,000  mL at 08/06/17 6381      Allergies: Not on File    Past Medical History: History reviewed. No pertinent past medical history.   Past Surgical History:    Family History: No family history on file.   Social History: Social History   Socioeconomic History  . Marital status: Married    Spouse name: Not on file  . Number of children: Not on file  . Years of education: Not on file  . Highest education level: Not on file  Occupational History  . Not on file  Social Needs  . Financial resource strain: Not on file  . Food insecurity:    Worry: Not on file    Inability: Not on file  . Transportation needs:    Medical: Not on file    Non-medical: Not on file  Tobacco Use  . Smoking status: Not on file  Substance and Sexual Activity  . Alcohol use: Not on file  . Drug use: Not on file  . Sexual activity: Not on file  Lifestyle  .  Physical activity:    Days per week: Not on file    Minutes per session: Not on file  . Stress: Not on file  Relationships  . Social connections:    Talks on phone: Not on file    Gets together: Not on file    Attends religious service: Not on file    Active member of club or organization: Not on file    Attends meetings of clubs or organizations: Not on file    Relationship status: Not on file  . Intimate partner violence:    Fear of current or ex partner: Not on file    Emotionally abused: Not on file    Physically abused: Not on file    Forced sexual activity: Not on file  Other Topics Concern  . Not on file  Social History Narrative  . Not on file     Review of Systems: Review of Systems  Unable to perform ROS: Critical illness    Vital Signs: Blood pressure (!) 90/53, pulse 96, temperature (!) 101 F (38.3 C), temperature source Oral, resp. rate 13, height '5\' 9"'  (1.753 m), weight 95.5 kg (210 lb 8.6 oz), SpO2 96 %.  Weight trends: Filed Weights   09/02/2017 2017 08/05/17 0400  Weight: 88.9 kg (196 lb) 95.5 kg (210 lb 8.6 oz)    Physical Exam: General: Critically ill  Head: ETT NGT to suction  Eyes: Anicteric, PERRL  Neck: Supple, trachea midline  Lungs:  Clear, PRVC Fio2 40%  Heart: Regular rate and rhythm  Abdomen:  Soft, midline incision - clean, dry and intact  Extremities: no peripheral edema.  Neurologic: Intubated and sedated  Skin: No lesions  Access: none     Lab results: Basic Metabolic Panel: Recent Labs  Lab 08/05/17 0516 08/05/17 1916 08/06/17 0559  NA 133* 136 137  K 5.0 5.8* 5.3*  CL 98* 104 108  CO2 18* 16* 17*  GLUCOSE 135* 107* 124*  BUN 100* 100* 111*  CREATININE 2.91* 3.40* 3.55*  CALCIUM 7.2* 6.5* 6.6*  MG  --   --  2.3  PHOS  --   --  7.0*    Liver Function Tests: Recent Labs  Lab 08/05/17 0516 08/06/17 0559  AST 86* 55*  ALT 37 29  ALKPHOS 151* 138*  BILITOT 2.0* 2.7*  PROT 5.3* 4.6*  ALBUMIN 1.8* 1.7*   No  results for input(s): LIPASE, AMYLASE  in the last 168 hours. Recent Labs  Lab 08/05/17 0752 08/06/17 0559  AMMONIA 55* 68*    CBC: Recent Labs  Lab 08/25/2017 2025 08/05/17 0752 08/05/17 1916 08/06/17 0559  WBC 24.6* 12.6* 15.0* 11.1*  NEUTROABS  --  11.5* 12.4* 7.2*  HGB 12.6* 10.1* 9.0* 7.5*  HCT 38.0* 32.1* 29.0* 23.7*  MCV 73.3* 75.3* 75.4* 74.4*  PLT 319 210 188 155    Cardiac Enzymes: Recent Labs  Lab 08/05/17 0516  TROPONINI <0.03    BNP: Invalid input(s): POCBNP  CBG: Recent Labs  Lab 08/05/17 2028 08/05/17 2345 08/06/17 0341 08/06/17 0734 08/06/17 0756  GLUCAP 101* 111* 110* 107* 108*    Microbiology: Results for orders placed or performed during the hospital encounter of 08/28/2017  Aerobic/Anaerobic Culture (surgical/deep wound)     Status: None (Preliminary result)   Collection Time: 08/05/17 12:17 AM  Result Value Ref Range Status   Specimen Description   Final    PERITONEAL Performed at [facility], [address] [place], [place] [address]    Special Requests   Final    NONE Performed at [facility], [address] [place], [place] [address]    Gram Stain   Final    FEW WBC PRESENT, PREDOMINANTLY PMN ABUNDANT GRAM NEGATIVE RODS ABUNDANT GRAM POSITIVE COCCI MODERATE GRAM POSITIVE RODS Performed at [facility], [place] [address], [place], [place] [address]    Culture PENDING  Incomplete   Report Status PENDING  Incomplete  MRSA PCR Screening     Status: None   Collection Time: 08/05/17  4:08 AM  Result Value Ref Range Status   MRSA by PCR NEGATIVE NEGATIVE Final    Comment:        The GeneXpert MRSA Assay (FDA approved for NASAL specimens only), is one component of a comprehensive MRSA colonization surveillance program. It is not intended to diagnose MRSA infection nor to guide or monitor treatment for MRSA infections. Performed at [facility], [address] [place], [place] [address]      Coagulation Studies: No results for input(s): LABPROT, INR in the last 72 hours.  Urinalysis: Recent Labs    08/05/17 0618  COLORURINE AMBER*  LABSPEC 1.015  PHURINE 5.0  GLUCOSEU NEGATIVE  HGBUR MODERATE*  BILIRUBINUR NEGATIVE  KETONESUR NEGATIVE  PROTEINUR 30*  NITRITE NEGATIVE  LEUKOCYTESUR NEGATIVE      Imaging: Ct Abdomen Pelvis Wo Contrast  Result Date: 08/23/2017 CLINICAL DATA:  Abdominal pain. EXAM: CT ABDOMEN AND PELVIS WITHOUT CONTRAST TECHNIQUE: Multidetector CT imaging of the abdomen and pelvis was performed following the standard protocol without IV contrast. COMPARISON:  None. FINDINGS: Lower chest: Bibasilar atelectasis. Hepatobiliary: Subcentimeter low-density lesion in the left liver is too small to characterize. Cholelithiasis. No gallbladder wall thickening or biliary dilatation. Pancreas: Unremarkable. No pancreatic ductal dilatation or surrounding inflammatory changes. Spleen: Normal in size without focal abnormality. Adrenals/Urinary Tract: Thickening of the bilateral adrenal glands without discrete nodule. Exophytic, subcentimeter low-density lesion arising from the left kidney, too small to characterize. No renal or ureteral calculi. Moderate diffuse bladder wall thickening may be related to underdistention. Stomach/Bowel: Prominent sigmoid diverticulosis. Mild wall thickening of the proximal sigmoid colon. Prominent thinning of the anterior wall of the proximal transverse colon (series 3, image 48). No bowel obstruction. Normal appendix. Vascular/Lymphatic: No significant vascular findings are present. No enlarged abdominal or pelvic lymph nodes. Reproductive: Mild prostatomegaly. Other: Large amount of pneumoperitoneum. Small ascites. Fat containing umbilical and left inguinal  hernias. Musculoskeletal: No acute or significant osseous findings. Severe right hip osteoarthritis. IMPRESSION: 1. Large amount of pneumoperitoneum and small ascites, consistent with bowel  perforation. Location of perforation is not definitively identified. Critical Value/emergent results were called by telephone at the time of interpretation on 08/31/2017 at 10:29 pm to Dr. Nance Pear , who verbally acknowledged these results. Electronically Signed   By: Titus Dubin M.D.   On: 09/02/2017 22:32   Dg Abd 1 View  Result Date: 08/05/2017 CLINICAL DATA:  57 year old male status post NGT placement. EXAM: PORTABLE CHEST 1 VIEW COMPARISON:  Abdominal CT dated 08/12/2017 FINDINGS: An endotracheal tube is seen with tip approximately 3 cm above the carina. An enteric tube extends into the left hemiabdomen with tip likely in the mid stomach. Shallow inspiration with bibasilar atelectatic changes. Pneumonia is not excluded. No large pleural effusion. There is no pneumothorax. Evaluation of the abdomen is limited on the provided images and secondary to body habitus. There degenerative changes of the spine. No acute osseous pathology. IMPRESSION: 1. Endotracheal tube above the carina and enteric tube likely in the stomach. 2. Shallow inspiration with bibasilar atelectasis/infiltrate. Electronically Signed   By: Anner Crete M.D.   On: 08/05/2017 04:41   Ct Head Wo Contrast  Result Date: 08/13/2017 CLINICAL DATA:  Altered mental status, abdominal pain. Renal insufficiency. EXAM: CT HEAD WITHOUT CONTRAST TECHNIQUE: Contiguous axial images were obtained from the base of the skull through the vertex without intravenous contrast. COMPARISON:  None. FINDINGS: BRAIN: No intraparenchymal hemorrhage, mass effect nor midline shift. The ventricles and sulci are normal for age. 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 No acute large vascular territory infarcts. No abnormal extra-axial fluid collections. Basal cisterns are patent. VASCULAR: Trace calcific atherosclerosis of the carotid siphons. SKULL: No skull fracture. Old slightly depressed LEFT nasal  bone fracture. No significant scalp soft tissue swelling. SINUSES/ORBITS: Trace paranasal sinus mucosal thickening with LEFT maxillary mucosal retention cyst. Mastoid air cells are well aerated. Soft tissue within the bilateral external auditory canals most compatible with cerumen.The included ocular globes and orbital contents are non-suspicious. OTHER: None. IMPRESSION: 1. No acute intracranial process. 2. 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 Electronically Signed   By: Elon Alas M.D.   On: 08/26/2017 22:20   Dg Chest Port 1 View  Result Date: 08/06/2017 CLINICAL DATA:  Hypoxia EXAM: PORTABLE CHEST 1 VIEW COMPARISON:  August 05, 2017 FINDINGS: Endotracheal tube tip is 2.4 cm above the carina. Nasogastric tube tip and side port are in the stomach. No pneumothorax. There is bibasilar atelectasis. Lungs elsewhere are clear. Heart is upper normal in size with pulmonary vascularity within normal limits. No evident adenopathy. No bone lesions. IMPRESSION: Tube positions as described without pneumothorax. Bibasilar atelectasis. Stable cardiac silhouette. Electronically Signed   By: Lowella Grip III M.D.   On: 08/06/2017 07:41   Dg Chest Port 1 View  Result Date: 08/05/2017 CLINICAL DATA:  57 year old male status post NGT placement. EXAM: PORTABLE CHEST 1 VIEW COMPARISON:  Abdominal CT dated 08/11/2017 FINDINGS: An endotracheal tube is seen with tip approximately 3 cm above the carina. An enteric tube extends into the left hemiabdomen with tip likely in the mid stomach. Shallow inspiration with bibasilar atelectatic changes. Pneumonia is not excluded. No large pleural effusion. There is no pneumothorax. Evaluation of the abdomen is limited on the provided images and secondary to body habitus. There degenerative changes of the spine. No acute  osseous pathology. IMPRESSION: 1. Endotracheal tube above the carina and enteric tube likely in the stomach. 2.  Shallow inspiration with bibasilar atelectasis/infiltrate. Electronically Signed   By: Anner Crete M.D.   On: 08/05/2017 04:41      Assessment & Plan: Mr. Aidric Endicott is a 57 y.o. Hispanic male with no significant past medical history, who was admitted to [facility] on 08/15/2017 for Free intraperitoneal air [K66.8] Abdominal pain, unspecified abdominal location [R10.9]  Exploratory laparotomy on 4/1 by Dr. Windell Moment.   1. Acute renal failure 2. Hyperkalemia 3. Metabolic Acidosis 4. Hyponatremia 5. Anemia with renal failure 6. Hypoalbuminemia 7. Proteinuria  Plan: no known baseline creatinine. Proteinuria on admission.   Suspect secondary to ATN from hypotension, surgery and infection - Continue IV fluids, IV albumin - Check renal ultrasound - Empiric zosyn - Discussed hemodialysis with patient's wife, Joevon Holliman. Currently all aggressive measures.   LOS: 2 Bruchy Mikel 4/3/201910:27 AM

## 2017-08-06 NOTE — Progress Notes (Signed)
Patient's BP had little improvement from multiple boluses throughout day. Levophed ordered. Nephrology consulted for worsening renal function, notified of new labs (decline from morning labs). Per Dr. Wynelle LinkKolluru patient will likely need CRRT tomorrow. Dr. Duanne LimerickSamaan notified and placed trialysis catheter. No complications encountered. Wife at bedside and updated on plan of care. Patient's sedation changed to propofol and fentanyl per Dr. Duanne LimerickSamaan. Received verbal order Dr. Wynelle LinkKolluru to hep lock trialysis. Dr. Aleen CampiPiscoya updated on patient's condition. Dr. Aleen CampiPiscoya also notified of increased serosanguinous drainage from surgical site, reinforced with ABD pads. Temp this am treated with 1 dose tylenol suppository per Dr. Duanne LimerickSamaan.  Trudee KusterBrandi R Mansfield

## 2017-08-06 NOTE — Procedures (Signed)
Hemodialysis Catheter Insertion Procedure Note Jaclynn Guarneriomas Berkery [id number] [birth year]  Procedure: Insertion of Hemodialysis Catheter Indications: Dialysis Access   Procedure Details Consent: Risks of procedure as well as the alternatives and risks of each were explained to the (patient/caregiver).  Consent for procedure obtained. Time Out: Verified patient identification, verified procedure, site/side was marked, verified correct patient position, special equipment/implants available, medications/allergies/relevent history reviewed, required imaging and test results available.  Performed  Maximum sterile technique was used including antiseptics. Skin prep: Chlorhexidine; local anesthetic administered Triple lumen hemodialysis catheter was inserted into right internal jugular vein using the Seldinger technique.  Evaluation Blood flow good Complications: No apparent complications Patient did tolerate procedure well. Chest X-ray ordered to verify placement.  CXR: normal.   Matasha Smigelski 08/06/2017

## 2017-08-07 ENCOUNTER — Inpatient Hospital Stay: Payer: Medicaid Other

## 2017-08-07 LAB — URINE CULTURE: Culture: NO GROWTH

## 2017-08-07 LAB — BLOOD GAS, ARTERIAL
Acid-Base Excess: 1 mmol/L (ref 0.0–2.0)
Bicarbonate: 25.2 mmol/L (ref 20.0–28.0)
FIO2: 0.4
O2 Saturation: 98.8 %
PEEP: 5 cmH2O
PH ART: 7.43 (ref 7.350–7.450)
Patient temperature: 37
RATE: 22 resp/min
VT: 550 mL
pCO2 arterial: 38 mmHg (ref 32.0–48.0)
pO2, Arterial: 122 mmHg — ABNORMAL HIGH (ref 83.0–108.0)

## 2017-08-07 LAB — RAPID HIV SCREEN (HIV 1/2 AB+AG)
HIV 1/2 ANTIBODIES: NONREACTIVE
HIV-1 P24 ANTIGEN - HIV24: NONREACTIVE

## 2017-08-07 LAB — BASIC METABOLIC PANEL
ANION GAP: 10 (ref 5–15)
Anion gap: 8 (ref 5–15)
BUN: 94 mg/dL — AB (ref 6–20)
BUN: 85 mg/dL — ABNORMAL HIGH (ref 6–20)
CALCIUM: 7.2 mg/dL — AB (ref 8.9–10.3)
CO2: 25 mmol/L (ref 22–32)
CO2: 28 mmol/L (ref 22–32)
Calcium: 6.9 mg/dL — ABNORMAL LOW (ref 8.9–10.3)
Chloride: 107 mmol/L (ref 101–111)
Chloride: 107 mmol/L (ref 101–111)
Creatinine, Ser: 2.63 mg/dL — ABNORMAL HIGH (ref 0.61–1.24)
Creatinine, Ser: 2.25 mg/dL — ABNORMAL HIGH (ref 0.61–1.24)
GFR calc Af Amer: 29 mL/min — ABNORMAL LOW (ref >60)
GFR calc non Af Amer: 25 mL/min — ABNORMAL LOW (ref >60)
GFR, EST AFRICAN AMERICAN: 35 mL/min — AB (ref >60)
GFR, EST NON AFRICAN AMERICAN: 30 mL/min — AB (ref >60)
GLUCOSE: 202 mg/dL — AB (ref 65–99)
Glucose, Bld: 204 mg/dL — ABNORMAL HIGH (ref 65–99)
POTASSIUM: 4.3 mmol/L (ref 3.5–5.1)
Potassium: 4.2 mmol/L (ref 3.5–5.1)
SODIUM: 143 mmol/L (ref 135–145)
Sodium: 142 mmol/L (ref 135–145)

## 2017-08-07 LAB — MAGNESIUM
MAGNESIUM: 2.6 mg/dL — AB (ref 1.7–2.4)
Magnesium: 2.5 mg/dL — ABNORMAL HIGH (ref 1.7–2.4)

## 2017-08-07 LAB — CBC
HCT: 22.6 % — ABNORMAL LOW (ref 40.0–52.0)
Hemoglobin: 7.2 g/dL — ABNORMAL LOW (ref 13.0–18.0)
MCH: 23.6 pg — ABNORMAL LOW (ref 26.0–34.0)
MCHC: 31.9 g/dL — AB (ref 32.0–36.0)
MCV: 73.8 fL — ABNORMAL LOW (ref 80.0–100.0)
Platelets: 136 10*3/uL — ABNORMAL LOW (ref 150–440)
RBC: 3.06 MIL/uL — ABNORMAL LOW (ref 4.40–5.90)
RDW: 17.6 % — AB (ref 11.5–14.5)
WBC: 10.4 10*3/uL (ref 3.8–10.6)

## 2017-08-07 LAB — HEPATIC FUNCTION PANEL
ALK PHOS: 151 U/L — AB (ref 38–126)
ALT: 24 U/L (ref 17–63)
AST: 42 U/L — AB (ref 15–41)
Albumin: 1.9 g/dL — ABNORMAL LOW (ref 3.5–5.0)
Bilirubin, Direct: 1.9 mg/dL — ABNORMAL HIGH (ref 0.1–0.5)
Indirect Bilirubin: 1.2 mg/dL — ABNORMAL HIGH (ref 0.3–0.9)
TOTAL PROTEIN: 5.2 g/dL — AB (ref 6.5–8.1)
Total Bilirubin: 3.1 mg/dL — ABNORMAL HIGH (ref 0.3–1.2)

## 2017-08-07 LAB — CBC WITH DIFFERENTIAL/PLATELET
BASOS PCT: 0 %
Basophils Absolute: 0 10*3/uL (ref 0–0.1)
Eosinophils Absolute: 0.1 10*3/uL (ref 0–0.7)
Eosinophils Relative: 1 %
HEMATOCRIT: 22.6 % — AB (ref 40.0–52.0)
HEMOGLOBIN: 7.2 g/dL — AB (ref 13.0–18.0)
LYMPHS ABS: 0.9 10*3/uL — AB (ref 1.0–3.6)
Lymphocytes Relative: 9 %
MCH: 23.3 pg — AB (ref 26.0–34.0)
MCHC: 31.9 g/dL — ABNORMAL LOW (ref 32.0–36.0)
MCV: 73.1 fL — ABNORMAL LOW (ref 80.0–100.0)
MONOS PCT: 6 %
Monocytes Absolute: 0.6 10*3/uL (ref 0.2–1.0)
NEUTROS ABS: 8.1 10*3/uL — AB (ref 1.4–6.5)
NEUTROS PCT: 84 %
Platelets: 137 10*3/uL — ABNORMAL LOW (ref 150–440)
RBC: 3.09 MIL/uL — AB (ref 4.40–5.90)
RDW: 17.9 % — ABNORMAL HIGH (ref 11.5–14.5)
WBC: 9.6 10*3/uL (ref 3.8–10.6)

## 2017-08-07 LAB — GLUCOSE, CAPILLARY
GLUCOSE-CAPILLARY: 127 mg/dL — AB (ref 65–99)
GLUCOSE-CAPILLARY: 168 mg/dL — AB (ref 65–99)
GLUCOSE-CAPILLARY: 174 mg/dL — AB (ref 65–99)
GLUCOSE-CAPILLARY: 193 mg/dL — AB (ref 65–99)
Glucose-Capillary: 163 mg/dL — ABNORMAL HIGH (ref 65–99)
Glucose-Capillary: 180 mg/dL — ABNORMAL HIGH (ref 65–99)

## 2017-08-07 LAB — PHOSPHORUS
PHOSPHORUS: 4.5 mg/dL (ref 2.5–4.6)
Phosphorus: 5 mg/dL — ABNORMAL HIGH (ref 2.5–4.6)

## 2017-08-07 LAB — PROCALCITONIN: PROCALCITONIN: 18.25 ng/mL

## 2017-08-07 LAB — CALCIUM, IONIZED
CALCIUM, IONIZED, SERUM: 3.8 mg/dL — AB (ref 4.5–5.6)
Calcium, Ionized, Serum: 3.8 mg/dL — ABNORMAL LOW (ref 4.5–5.6)

## 2017-08-07 LAB — SURGICAL PATHOLOGY

## 2017-08-07 LAB — AMMONIA: Ammonia: 42 umol/L — ABNORMAL HIGH (ref 9–35)

## 2017-08-07 MED ORDER — FLUCONAZOLE IN SODIUM CHLORIDE 200-0.9 MG/100ML-% IV SOLN
200.0000 mg | INTRAVENOUS | Status: DC
  Administered 2017-08-08: 200 mg via INTRAVENOUS
  Filled 2017-08-07 (×3): qty 100

## 2017-08-07 MED ORDER — ALBUMIN HUMAN 25 % IV SOLN
25.0000 g | Freq: Once | INTRAVENOUS | Status: AC
  Administered 2017-08-07: 25 g via INTRAVENOUS
  Filled 2017-08-07: qty 100

## 2017-08-07 MED ORDER — DEXMEDETOMIDINE HCL IN NACL 400 MCG/100ML IV SOLN
0.4000 ug/kg/h | INTRAVENOUS | Status: DC
Start: 2017-08-07 — End: 2017-08-07
  Administered 2017-08-07: 0.4 ug/kg/h via INTRAVENOUS
  Administered 2017-08-07: 0.6 ug/kg/h via INTRAVENOUS
  Filled 2017-08-07 (×3): qty 100

## 2017-08-07 MED ORDER — SODIUM CHLORIDE 0.45 % IV SOLN
INTRAVENOUS | Status: DC
  Administered 2017-08-07 – 2017-08-09 (×3): via INTRAVENOUS

## 2017-08-07 MED ORDER — ACETAMINOPHEN 650 MG RE SUPP
650.0000 mg | Freq: Once | RECTAL | Status: AC
  Administered 2017-08-07: 650 mg via RECTAL
  Filled 2017-08-07: qty 1

## 2017-08-07 MED ORDER — PROPOFOL 1000 MG/100ML IV EMUL
5.0000 ug/kg/min | INTRAVENOUS | Status: DC
  Administered 2017-08-07: 15 ug/kg/min via INTRAVENOUS
  Administered 2017-08-08: 40 ug/kg/min via INTRAVENOUS
  Administered 2017-08-08: 20 ug/kg/min via INTRAVENOUS
  Administered 2017-08-08: 35 ug/kg/min via INTRAVENOUS
  Administered 2017-08-09: 40 ug/kg/min via INTRAVENOUS
  Administered 2017-08-09: 30 ug/kg/min via INTRAVENOUS
  Administered 2017-08-09: 40 ug/kg/min via INTRAVENOUS
  Administered 2017-08-10: 10 ug/kg/min via INTRAVENOUS
  Filled 2017-08-07 (×9): qty 100

## 2017-08-07 MED ORDER — SODIUM CHLORIDE 0.9 % IV SOLN
200.0000 mg | Freq: Once | INTRAVENOUS | Status: DC
  Administered 2017-08-07: 200 mg via INTRAVENOUS
  Filled 2017-08-07: qty 200

## 2017-08-07 MED ORDER — SODIUM CHLORIDE 0.9 % IV SOLN: 100.0000 mg | INTRAVENOUS | Status: DC

## 2017-08-07 MED ORDER — VANCOMYCIN HCL IN DEXTROSE 1-5 GM/200ML-% IV SOLN
1000.0000 mg | INTRAVENOUS | Status: DC
  Filled 2017-08-07 (×2): qty 200

## 2017-08-07 MED ORDER — SODIUM BICARBONATE 8.4 % IV SOLN
INTRAVENOUS | Status: DC
  Administered 2017-08-07: 09:00:00 via INTRAVENOUS
  Filled 2017-08-07 (×5): qty 75

## 2017-08-07 MED ORDER — FLUCONAZOLE IN SODIUM CHLORIDE 400-0.9 MG/200ML-% IV SOLN
400.0000 mg | Freq: Once | INTRAVENOUS | Status: AC
  Administered 2017-08-07: 400 mg via INTRAVENOUS
  Filled 2017-08-07: qty 200

## 2017-08-07 MED ORDER — VANCOMYCIN HCL 10 G IV SOLR
1500.0000 mg | Freq: Once | INTRAVENOUS | Status: AC
  Administered 2017-08-07: 1500 mg via INTRAVENOUS
  Filled 2017-08-07 (×2): qty 1500

## 2017-08-07 NOTE — Progress Notes (Addendum)
   Name: Cory Graham MRN: [id number] DOB: March 16, 1961     CONSULTATION DATE:  REFERRING MD :   Remains on vent, Levophed + Propofol + Fentanyl + Bicarb gtt. Improved urine out put.  PAST MEDICAL HISTORY :   has no past medical history on file.  has a past surgical history that includes laparotomy (N/A, 08/26/2017). Prior to Admission medications   Not on File   Not on File  FAMILY HISTORY:  family history is not on file. SOCIAL HISTORY:    REVIEW OF SYSTEMS:   Constitutional: Negative for fever, chills, weight loss, malaise/fatigue and diaphoresis.  HENT: Negative for hearing loss, ear pain, nosebleeds, congestion, sore throat, neck pain, tinnitus and ear discharge.   Eyes: Negative for blurred vision, double vision, photophobia, pain, discharge and redness.  Respiratory: Negative for cough, hemoptysis, sputum production, shortness of breath, wheezing and stridor.   Cardiovascular: Negative for chest pain, palpitations, orthopnea, claudication, leg swelling and PND.  Gastrointestinal: Negative for heartburn, nausea, vomiting, abdominal pain, diarrhea, constipation, blood in stool and melena.  Genitourinary: Negative for dysuria, urgency, frequency, hematuria and flank pain.  Musculoskeletal: Negative for myalgias, back pain, joint pain and falls.  Skin: Negative for itching and rash.  Neurological: Negative for dizziness, tingling, tremors, sensory change, speech change, focal weakness, seizures, loss of consciousness, weakness and headaches.  Endo/Heme/Allergies: Negative for environmental allergies and polydipsia. Does not bruise/bleed easily.  ALL OTHER ROS ARE NEGATIVE    VITAL SIGNS: Temp:  [98.7 F (37.1 C)-102 F (38.9 C)] 102 F (38.9 C) (04/04 0700) Pulse Rate:  [75-112] 98 (04/04 0700) Resp:  [11-25] 22 (04/04 0700) BP: (72-145)/(47-77) 106/67 (04/04 0700) SpO2:  [91 %-98 %] 95 % (04/04 0700) FiO2 (%):  [40 %] 40 % (04/04 0406)  Physical Examination:    Sedatedto RASS of -3. Propofol+ Fentanyl On the vent in no distress, equal air entry and no adventitious sounds S1 + S2 audible with no murmur Status post laparotomy.benign abdomen and no peristalsis. Soaked laparotomy dressing No peripheral edema  ASSESSMENT / PLAN:  Acute respiratory failure remained intubated postoperatively because of poor weaning parameters. -Monitor ABG, optimize vent settings, SAT + SBT tolerated and consider weaning parameters  Atelectasis and pneumonia. worsening bibasilar airspace disease -Empiric Zosyn. Monitor CXR + CBC + FiO2 continue with respiratory care.  Hypotension with sepsis. mesd and volume depletion. -Optimize volume, Levoohed, monitor hemodynamics.  AKI (improved) with sepsis, acidosis and ATN -Optimize hemodynamics, avoid nephrotoxins, monitor renal function and urine out put. -Optimize Bicarb based solution and follow with renal. -No obstructive uropathy on renal U/S. -Dialysis cath in Rt. IJ and hasn't started on CRRT.   POD #3separation laparotomy for pneumoperitoneum perforation.Unable to identify source of perforation. Wound culture GPC, GNR and GPR -Questionable wound dehiscence with soaked dressing. Management as per surgery. -Vanc + Zosyn + Eraxis. Follow with ID recommendation for antimicrobials. -Management as per surgery  Sepsis with lactic acidemia [improved, LA 1.4]. Afebrile, WBC and lactic acid trending down. -Empiric Zosyn cultures remain negative -Monitor lactic acid level  Elevated liver enzymes and hyperammonemia(improved)  with sepsis -Monitor LFTs, avoid hepatotoxins.  Hypertriglyceridemia -d/c Propofol and start on Precedex for sedation  Thrombocytopenia -Monitor Plat.  Full code  DVT and GI prophylaxis. Continue with supportive care. Critical care time 35 min

## 2017-08-07 NOTE — Progress Notes (Signed)
Pt noted to have a 102 temp, MD Samaan notified and verbal order given for one time dose of tylenol 650 suppository. Room temp was decreased, all additional sheets, scd's, and pillows were removed as well as ice packs placed under pt arms.

## 2017-08-07 NOTE — Progress Notes (Signed)
[facility]  ROUNDING NOTE   Subjective:   Tmax 102  UOP 2375   Creatinine 2.63 (3.72)  Continues on norepinephrine  Objective:  Vital signs in last 24 hours:  Temp:  [98.7 F (37.1 C)-102 F (38.9 C)] 102 F (38.9 C) (04/04 0900) Pulse Rate:  [75-112] 89 (04/04 0900) Resp:  [11-25] 22 (04/04 0900) BP: (72-145)/(47-77) 98/63 (04/04 0900) SpO2:  [91 %-98 %] 94 % (04/04 0900) FiO2 (%):  [30 %-40 %] 30 % (04/04 0820)  Weight change:  Filed Weights   08/27/2017 2017 08/05/17 0400  Weight: 88.9 kg (196 lb) 95.5 kg (210 lb 8.6 oz)    Intake/Output: I/O last 3 completed shifts: In: 8326.7 [I.V.:5976.7; IV Piggyback:2350] Out: 8563 [JSHFW:2637; Emesis/NG output:750]   Intake/Output this shift:  No intake/output data recorded.  Physical Exam: General: Critically ill   Head: ETT NGT to suction  Eyes: Anicteric, PERRL  Neck: Supple, trachea midline  Lungs:  Clear to auscultation, PRVC FiO2   Heart: Regular rate and rhythm  Abdomen:  + midline incision  Extremities:  trace peripheral edema.  Neurologic: Nonfocal, moving all four extremities  Skin: No lesions  Access: RIJ temp HD catheter 4/3 Dr. Soyla Murphy    Basic Metabolic Panel: Recent Labs  Lab 08/05/17 0516 08/05/17 1916 08/06/17 0559 08/06/17 1240 08/07/17 0646  NA 133* 136 137 137 142  K 5.0 5.8* 5.3* 5.1 4.3  CL 98* 104 108 109 107  CO2 18* 16* 17* 17* 25  GLUCOSE 135* 107* 124* 134* 202*  BUN 100* 100* 111* 115* 94*  CREATININE 2.91* 3.40* 3.55* 3.72* 2.63*  CALCIUM 7.2* 6.5* 6.6* 6.8* 6.9*  MG  --   --  2.3 2.7* 2.5*  PHOS  --   --  7.0* 7.4* 5.0*    Liver Function Tests: Recent Labs  Lab 08/05/17 0516 08/06/17 0559 08/07/17 0646  AST 86* 55* 42*  ALT 37 29 24  ALKPHOS 151* 138* 151*  BILITOT 2.0* 2.7* 3.1*  PROT 5.3* 4.6* 5.2*  ALBUMIN 1.8* 1.7* 1.9*   No results for input(s): LIPASE, AMYLASE in the last 168 hours. Recent Labs  Lab 08/05/17 0752 08/06/17 0559  08/07/17 0646  AMMONIA 55* 68* 42*    CBC: Recent Labs  Lab 08/22/2017 2025 08/05/17 0752 08/05/17 1916 08/06/17 0559 08/07/17 0646  WBC 24.6* 12.6* 15.0* 11.1* 9.6  NEUTROABS  --  11.5* 12.4* 7.2* 8.1*  HGB 12.6* 10.1* 9.0* 7.5* 7.2*  HCT 38.0* 32.1* 29.0* 23.7* 22.6*  MCV 73.3* 75.3* 75.4* 74.4* 73.1*  PLT 319 210 188 155 137*    Cardiac Enzymes: Recent Labs  Lab 08/05/17 0516  TROPONINI <0.03    BNP: Invalid input(s): POCBNP  CBG: Recent Labs  Lab 08/06/17 1625 08/06/17 2000 08/06/17 2355 08/07/17 0357 08/07/17 0729  GLUCAP 153* 201* 186* 193* 174*    Microbiology: Results for orders placed or performed during the hospital encounter of 08/06/2017  Aerobic/Anaerobic Culture (surgical/deep wound)     Status: None (Preliminary result)   Collection Time: 08/05/17 12:17 AM  Result Value Ref Range Status   Specimen Description   Final    PERITONEAL Performed at [facility], [address], [place], [place] [address]    Special Requests   Final    NONE Performed at [facility], [address] [place], [place] [address]    Gram Stain   Final    FEW WBC PRESENT, PREDOMINANTLY PMN ABUNDANT GRAM NEGATIVE RODS ABUNDANT GRAM POSITIVE  COCCI MODERATE GRAM POSITIVE RODS    Culture   Final    CULTURE REINCUBATED FOR BETTER GROWTH Performed at [facility], [place] [address] [place], [place] [address]    Report Status PENDING  Incomplete  MRSA PCR Screening     Status: None   Collection Time: 08/05/17  4:08 AM  Result Value Ref Range Status   MRSA by PCR NEGATIVE NEGATIVE Final    Comment:        The GeneXpert MRSA Assay (FDA approved for NASAL specimens only), is one component of a comprehensive MRSA colonization surveillance program. It is not intended to diagnose MRSA infection nor to guide or monitor treatment for MRSA infections. Performed at [facility], [address] [place], [place] [address]   CULTURE,  BLOOD (ROUTINE X 2) w Reflex to ID Panel     Status: None (Preliminary result)   Collection Time: 08/06/17 10:03 AM  Result Value Ref Range Status   Specimen Description BLOOD LEFT HAND  Final   Special Requests   Final    BOTTLES DRAWN AEROBIC AND ANAEROBIC Blood Culture results may not be optimal due to an excessive volume of blood received in culture bottles   Culture   Final    NO GROWTH < 24 HOURS Performed at [facility], [address], [place], [place] [address]    Report Status PENDING  Incomplete  CULTURE, BLOOD (ROUTINE X 2) w Reflex to ID Panel     Status: None (Preliminary result)   Collection Time: 08/06/17 10:03 AM  Result Value Ref Range Status   Specimen Description BLOOD LEFT WRIST  Final   Special Requests   Final    BOTTLES DRAWN AEROBIC AND ANAEROBIC Blood Culture adequate volume   Culture   Final    NO GROWTH < 24 HOURS Performed at [facility], [address], [place], [place] [address]    Report Status PENDING  Incomplete  Urine Culture     Status: None   Collection Time: 08/06/17 10:25 AM  Result Value Ref Range Status   Specimen Description   Final    URINE, RANDOM Performed at [facility], [address], [place], [place] [address]    Special Requests   Final    NONE Performed at [facility], [address], [place], [place] [address]    Culture   Final    NO GROWTH Performed at [facility], [place] [address], [place],  [address]    Report Status 08/07/2017 FINAL  Final    Coagulation Studies: Recent Labs    08/06/17 1405  LABPROT 16.8*  INR 1.37    Urinalysis: Recent Labs    08/05/17 0618  COLORURINE AMBER*  LABSPEC 1.015  PHURINE 5.0  GLUCOSEU NEGATIVE  HGBUR MODERATE*  BILIRUBINUR NEGATIVE  KETONESUR NEGATIVE  PROTEINUR 30*  NITRITE NEGATIVE  LEUKOCYTESUR NEGATIVE      Imaging: US Renal  Result Date: 08/06/2017 CLINICAL DATA:  Acute kidney failure. EXAM: RENAL /  URINARY TRACT ULTRASOUND COMPLETE COMPARISON:  CT 08/16/2017 FINDINGS: Right Kidney: Length: 11.9 cm. Echogenicity within normal limits. No mass or hydronephrosis visualized. Left Kidney: Length: 12.9 cm. Echogenicity within normal limits. No mass or hydronephrosis visualized. Bladder: Foley catheter in the bladder. Ascites noted in the left upper quadrant. IMPRESSION: Negative ultrasound. Normal size kidneys. No hydronephrosis. Normal echogenicity. Electronically Signed   By: Nelson Chimes M.D.   On: 08/06/2017 14:04   Dg Chest  Port 1 View  Result Date: 08/07/2017 CLINICAL DATA:  Pneumonia EXAM: PORTABLE CHEST 1 VIEW COMPARISON:  08/06/2017 FINDINGS: Endotracheal tube in good position. Right jugular catheter tip in the right atrium. Hypoventilation with low lung volumes and bibasilar atelectasis. Progression of bibasilar atelectasis since yesterday. Small effusions. IMPRESSION: Hypoventilation with progression of bibasilar atelectasis. Electronically Signed   By: Franchot Gallo M.D.   On: 08/07/2017 07:12   Dg Chest Port 1 View  Result Date: 08/06/2017 CLINICAL DATA:  Central line placement. EXAM: PORTABLE CHEST 1 VIEW COMPARISON:  One-view chest x-ray 08/06/2017 at 3:55 a.m. FINDINGS: The heart size is exaggerate by low lung volumes. Endotracheal tube remains 2.5 cm above the carina. NG tube terminates in the fundus of the stomach. Bilateral pleural effusions and atelectasis are noted. A new right IJ line is in place. The tip is at the cavoatrial junction. There is no pneumothorax. IMPRESSION: 1. Interval placement of right IJ line without radiographic evidence for complication. The tip is at the cavoatrial junction. 2. Persistent low lung volumes and small bilateral pleural effusions. 3. Support apparatus is otherwise stable. Electronically Signed   By: San Morelle M.D.   On: 08/06/2017 16:10   Dg Chest Port 1 View  Result Date: 08/06/2017 CLINICAL DATA:  Hypoxia EXAM: PORTABLE CHEST 1 VIEW  COMPARISON:  August 05, 2017 FINDINGS: Endotracheal tube tip is 2.4 cm above the carina. Nasogastric tube tip and side port are in the stomach. No pneumothorax. There is bibasilar atelectasis. Lungs elsewhere are clear. Heart is upper normal in size with pulmonary vascularity within normal limits. No evident adenopathy. No bone lesions. IMPRESSION: Tube positions as described without pneumothorax. Bibasilar atelectasis. Stable cardiac silhouette. Electronically Signed   By: Lowella Grip III M.D.   On: 08/06/2017 07:41     Medications:   . albumin human    . dexmedetomidine (PRECEDEX) IV infusion 0.4 mcg/kg/hr (08/07/17 0856)  . fentaNYL infusion INTRAVENOUS 125 mcg/hr (08/07/17 0600)  . norepinephrine (LEVOPHED) Adult infusion 12 mcg/min (08/07/17 0701)  . piperacillin-tazobactam (ZOSYN)  IV Stopped (08/07/17 1008)  .  sodium bicarbonate  infusion 1000 mL 150 mL/hr at 08/07/17 0852   . chlorhexidine gluconate (MEDLINE KIT)  15 mL Mouth Rinse BID  . enoxaparin (LOVENOX) injection  30 mg Subcutaneous Q24H  . mouth rinse  15 mL Mouth Rinse 10 times per day  . pantoprazole (PROTONIX) IV  40 mg Intravenous QHS   fentaNYL, heparin, midazolam, midazolam, morphine injection  Assessment/ Plan:  Mr. Cory Graham is a 57 y.o. Hispanic male with no significant past medical history, who was admitted to [facility] on 08/29/2017 for Free intraperitoneal air [K66.8] Abdominal pain, unspecified abdominal location [R10.9]  Exploratory laparotomy on 4/1 by Dr. Windell Moment.   1. Acute renal failure 2. Hyperkalemia 3. Metabolic Acidosis 4. Hyponatremia 5. Anemia with renal failure 6. Hypoalbuminemia 7. Proteinuria  Plan: no known baseline creatinine. Proteinuria on admission.   Suspect secondary to ATN from hypotension, surgery and infection - Continue IV fluids: Sodium bicarbonate gtt - Febrile: Empiric zosyn - Continue vasopressors - No acute indication for dialysis.    LOS: 3 Cory  Graham 4/4/201910:23 AM

## 2017-08-07 NOTE — Progress Notes (Signed)
08/07/2017  Subjective: Patient is 2 Days Post-Op s/p exploratory laparotomy and washout.  Patient requiring norepi now to improve his blood pressure for kidney perfusion.  His renal function has improved today and is making more urine.  He was febrile this morning and CXR shows bilateral atelectasis.  Vital signs: Temp:  [98.7 F (37.1 C)-102.2 F (39 C)] 102 F (38.9 C) (04/04 1100) Pulse Rate:  [75-112] 81 (04/04 1100) Resp:  [11-25] 20 (04/04 1100) BP: (72-145)/(47-77) 100/60 (04/04 1100) SpO2:  [91 %-98 %] 95 % (04/04 1100) FiO2 (%):  [30 %-40 %] 30 % (04/04 0820)   Intake/Output: 04/03 0701 - 04/04 0700 In: 6540.8 [I.V.:4190.8; IV Piggyback:2350] Out: 2625 [Urine:2575; Emesis/NG output:50] Last BM Date: (unknown)  Physical Exam: Constitutional: Sedated, intubated Cardiac: on norepi and bicarb drips Pulm: on vent, decreased breath sounds bilaterally Abdomen: soft, obese, distended, with NG tube in place.  There is serosanguinous fluid coming from different portions of the wound superiorly and inferiorly.  Staples intact.  New clean dressing applied GU:  Foley in place with more urine in bag.  Labs:  Recent Labs    08/06/17 0559 08/07/17 0646  WBC 11.1* 9.6  HGB 7.5* 7.2*  HCT 23.7* 22.6*  PLT 155 137*   Recent Labs    08/06/17 1240 08/07/17 0646  NA 137 142  K 5.1 4.3  CL 109 107  CO2 17* 25  GLUCOSE 134* 202*  BUN 115* 94*  CREATININE 3.72* 2.63*  CALCIUM 6.8* 6.9*   Recent Labs    08/06/17 1405  LABPROT 16.8*  INR 1.37    Imaging: [place] Renal  Result Date: 08/06/2017 CLINICAL DATA:  Acute kidney failure. EXAM: RENAL / URINARY TRACT ULTRASOUND COMPLETE COMPARISON:  CT 08/28/2017 FINDINGS: Right Kidney: Length: 11.9 cm. Echogenicity within normal limits. No mass or hydronephrosis visualized. Left Kidney: Length: 12.9 cm. Echogenicity within normal limits. No mass or hydronephrosis visualized. Bladder: Foley catheter in the bladder. Ascites noted in the  left upper quadrant. IMPRESSION: Negative ultrasound. Normal size kidneys. No hydronephrosis. Normal echogenicity. Electronically Signed   By: Paulina FusiMark  Shogry M.D.   On: 08/06/2017 14:04   Dg Chest Port 1 View  Result Date: 08/07/2017 CLINICAL DATA:  Pneumonia EXAM: PORTABLE CHEST 1 VIEW COMPARISON:  08/06/2017 FINDINGS: Endotracheal tube in good position. Right jugular catheter tip in the right atrium. Hypoventilation with low lung volumes and bibasilar atelectasis. Progression of bibasilar atelectasis since yesterday. Small effusions. IMPRESSION: Hypoventilation with progression of bibasilar atelectasis. Electronically Signed   By: Marlan Palauharles  Clark M.D.   On: 08/07/2017 07:12   Dg Chest Port 1 View  Result Date: 08/06/2017 CLINICAL DATA:  Central line placement. EXAM: PORTABLE CHEST 1 VIEW COMPARISON:  One-view chest x-ray 08/06/2017 at 3:55 a.m. FINDINGS: The heart size is exaggerate by low lung volumes. Endotracheal tube remains 2.5 cm above the carina. NG tube terminates in the fundus of the stomach. Bilateral pleural effusions and atelectasis are noted. A new right IJ line is in place. The tip is at the cavoatrial junction. There is no pneumothorax. IMPRESSION: 1. Interval placement of right IJ line without radiographic evidence for complication. The tip is at the cavoatrial junction. 2. Persistent low lung volumes and small bilateral pleural effusions. 3. Support apparatus is otherwise stable. Electronically Signed   By: Marin Robertshristopher  Mattern M.D.   On: 08/06/2017 16:10    Assessment/Plan: 57 yo male s/p exlap for pneumoperitoneum but unable to find source.  --drainage coming from different points of wound.  Would be unlikely that the entire fascial closure had opened allowing for ascites fluid to leak, rather could be subcutaneous fluid given all the fluid he's been getting and edema resulting. Would was redressed today and will come back later to explore his wound further to check fascia. --continue  ICU care with pressors, vent, IV abx.  Agree with no acute dialysis need at the moment given improved renal function.   Howie Ill, MD [facility]

## 2017-08-07 NOTE — Progress Notes (Signed)
Pharmacy Antibiotic Note  Cory Graham is a 57 y.o. male admitted on 08/28/2017 with bowel perforation s/p exploratory laparotomy. Patient has been receiving Zosyn.  Pharmacy has been consulted for vancomycin dosing. Patient is also ordered anidulafungin.   Plan: Continue Zosyn 3.375 g EI q 8 hours.   Will order vancomycin 1500 mg IV once followed by 1000 mg iv q 24 hours to target a trough of 15-20 mcg/ml. With renal function resolving, will not order trough at this time. Will need to monitor renal function closely and check levels/adjust dosing as clinically indicated.   Height: 5\' 9"  (175.3 cm) Weight: 210 lb 8.6 oz (95.5 kg) IBW/kg (Calculated) : 70.7  Temp (24hrs), Avg:101.1 F (38.4 C), Min:98.7 F (37.1 C), Max:102.2 F (39 C)  Recent Labs  Lab 08/20/2017 2025  08/05/17 0516 08/05/17 0612 08/05/17 0752 08/05/17 1916 08/05/17 2221 08/06/17 0559 08/06/17 0919 08/06/17 1240 08/07/17 0646  WBC 24.6*  --   --   --  12.6* 15.0*  --  11.1*  --   --  9.6  CREATININE 3.42*  --  2.91*  --   --  3.40*  --  3.55*  --  3.72* 2.63*  LATICACIDVEN  --    < >  --  4.9*  --  3.2* 2.3*  --  1.3 1.4  --    < > = values in this interval not displayed.    Estimated Creatinine Clearance: 34.5 mL/min (A) (by C-G formula based on SCr of 2.63 mg/dL (H)).    Not on File  Antimicrobials this admission: Zosyn 4/1 >>  vancomycin 4/4 >>  anidulafungin 4/4 >>  Dose adjustments this admission:   Microbiology results: 4/3 BCx: NGTD 4/2 Wound Cx: multiple organisms 4/2 MRSA PCR: negative  Thank you for allowing pharmacy to be a part of this patient's care.  Luisa HartChristy, Rhema Boyett D 08/07/2017 1:25 PM

## 2017-08-07 NOTE — Progress Notes (Signed)
Inpatient Diabetes Program Recommendations  AACE/ADA: New Consensus Statement on Inpatient Glycemic Control (2015)  Target Ranges:  Prepandial:   less than 140 mg/dL      Peak postprandial:   less than 180 mg/dL (1-2 hours)      Critically ill patients:  140 - 180 mg/dL   Lab Results  Component Value Date   GLUCAP 174 (H) 08/07/2017    Review of Glycemic ControlResults for Cory GuarneriMENDOZA, Jasir (MRN [id number]) as of 08/07/2017 12:21  Ref. Range 08/06/2017 16:25 08/06/2017 20:00 08/06/2017 23:55 08/07/2017 03:57 08/07/2017 07:29  Glucose-Capillary Latest Ref Range: 65 - 99 mg/dL 409153 (H) 811201 (H) 914186 (H) 193 (H) 174 (H)    Diabetes history: None  Inpatient Diabetes Program Recommendations:    Note blood sugars> hospital goal.  Consider adding Novolog sensitive q 4 hours while in ICU.    Thanks  Beryl MeagerJenny Samanvi Cuccia, RN, BC-ADM Inpatient Diabetes Coordinator Pager [phone] (8a-5p)

## 2017-08-07 NOTE — Progress Notes (Signed)
Pharmacy Antibiotic Note  Cory Graham is a 57 y.o. male admitted on 08/18/2017 with bowel perforation s/p exploratory laparotomy. Patient has been receiving Zosyn.  Pharmacy has been consulted for vancomycin dosing. Patient is also ordered anidulafungin. After discussion with Dr. Sampson GoonFitzgerald, will transition from anidulafungin to fluconazole.   Plan: Will begin fluconazole 400 mg iv once followed by 200 mg iv q 24 hours.   Continue Zosyn 3.375 g EI q 8 hours.   Will order vancomycin 1500 mg IV once followed by 1000 mg iv q 24 hours to target a trough of 15-20 mcg/ml. With renal function resolving, will not order trough at this time. Will need to monitor renal function closely and check levels/adjust dosing as clinically indicated.   Height: 5\' 9"  (175.3 cm) Weight: 210 lb 8.6 oz (95.5 kg) IBW/kg (Calculated) : 70.7  Temp (24hrs), Avg:101.1 F (38.4 C), Min:98.7 F (37.1 C), Max:102.2 F (39 C)  Recent Labs  Lab 08/25/2017 2025  08/05/17 0516 08/05/17 0612 08/05/17 0752 08/05/17 1916 08/05/17 2221 08/06/17 0559 08/06/17 0919 08/06/17 1240 08/07/17 0646  WBC 24.6*  --   --   --  12.6* 15.0*  --  11.1*  --   --  9.6  CREATININE 3.42*  --  2.91*  --   --  3.40*  --  3.55*  --  3.72* 2.63*  LATICACIDVEN  --    < >  --  4.9*  --  3.2* 2.3*  --  1.3 1.4  --    < > = values in this interval not displayed.    Estimated Creatinine Clearance: 34.5 mL/min (A) (by C-G formula based on SCr of 2.63 mg/dL (H)).    Not on File  Antimicrobials this admission: Zosyn 4/1 >>  vancomycin 4/4 >>  anidulafungin 4/4 x 1 Fluconazole 4/4 >>  Dose adjustments this admission:   Microbiology results: 4/3 BCx: NGTD 4/2 Wound Cx: multiple organisms 4/2 MRSA PCR: negative  Thank you for allowing pharmacy to be a part of this patient's care.  Valentina GuChristy, Araina Butrick D 08/07/2017 3:39 PM

## 2017-08-07 NOTE — Consult Note (Signed)
[facility] Infectious Disease     Reason for Consult:Bowel perforation  Referring Physician: Chesley Noon Date of Admission:  08/22/2017   Active Problems:   Bowel perforation ([place])   HPI: Cory Graham is a 57 y.o. male with no significant pmh admitted with abd pain and confusion. On admit wbc 24, temp nml but then increased to 102. CT showed evidence of bowel perf. Underwent ex lap 4/2 with finding of purulent secretions but no perforation found. Started on zosyn, wbc down but had fever 4/3.   Some drainage from wound.  Cx from ex lap with mixed organisms.  Remains quite ill on pressors, intubated. No history obtained from pt, no family at bedside   History reviewed. No pertinent past medical history.  Social History   Tobacco Use  . Smoking status: Not on file  Substance Use Topics  . Alcohol use: Not on file  . Drug use: Not on file   No family history on file.  Allergies: Not on File  Current antibiotics: Antibiotics Given (last 72 hours)    Date/Time Action Medication Dose Rate   08/20/2017 2234 New Bag/Given   piperacillin-tazobactam (ZOSYN) IVPB 3.375 g 3.375 g 100 mL/hr   08/05/17 0423 New Bag/Given   piperacillin-tazobactam (ZOSYN) IVPB 3.375 g 3.375 g 12.5 mL/hr   08/05/17 1503 New Bag/Given   piperacillin-tazobactam (ZOSYN) IVPB 3.375 g 3.375 g 12.5 mL/hr   08/05/17 2156 New Bag/Given   piperacillin-tazobactam (ZOSYN) IVPB 3.375 g 3.375 g 12.5 mL/hr   08/06/17 0500 New Bag/Given   piperacillin-tazobactam (ZOSYN) IVPB 3.375 g 3.375 g 12.5 mL/hr   08/06/17 1500 New Bag/Given   piperacillin-tazobactam (ZOSYN) IVPB 3.375 g 3.375 g 12.5 mL/hr   08/06/17 2143 New Bag/Given   piperacillin-tazobactam (ZOSYN) IVPB 3.375 g 3.375 g 12.5 mL/hr   08/07/17 0502 New Bag/Given   piperacillin-tazobactam (ZOSYN) IVPB 3.375 g 3.375 g 12.5 mL/hr      MEDICATIONS: . chlorhexidine gluconate (MEDLINE KIT)  15 mL Mouth Rinse BID  . enoxaparin (LOVENOX) injection  30 mg  Subcutaneous Q24H  . mouth rinse  15 mL Mouth Rinse 10 times per day  . pantoprazole (PROTONIX) IV  40 mg Intravenous QHS    Review of Systems - unable to obtain  OBJECTIVE: Temp:  [98.7 F (37.1 C)-102.2 F (39 C)] 100.9 F (38.3 C) (04/04 1400) Pulse Rate:  [73-112] 73 (04/04 1400) Resp:  [18-25] 22 (04/04 1400) BP: (74-145)/(52-77) 117/71 (04/04 1400) SpO2:  [91 %-98 %] 92 % (04/04 1400) FiO2 (%):  [30 %-40 %] 30 % (04/04 1203) Physical Exam  Constitutional: intubated, sedated, critically ill appearing HENT: anicteric Mouth/Throat: ETT in place Cardiovascular: Normal rate, regular rhythm and normal heart sounds. Pulmonary/Chest: Effort normal and breath sounds normal. No respiratory distress.  Abdominal: Soft. Distended, midline incision intact, has some serous drainage, no purulence Lymphadenopathy: He has no cervical adenopathy.  Neurological: intubated, sedated Et 1+ edema bil LE Skin: Skin is warm and dry. No rash noted. No erythema.  Psychiatric:unable to obtain  LABS: Results for orders placed or performed during the hospital encounter of 08/12/2017 (from the past 48 hour(s))  Glucose, capillary     Status: None   Collection Time: 08/05/17  4:08 PM  Result Value Ref Range   Glucose-Capillary 93 65 - 99 mg/dL  Lactic acid, plasma     Status: Abnormal   Collection Time: 08/05/17  7:16 PM  Result Value Ref Range   Lactic Acid, Venous 3.2 (HH) 0.5 - 1.9 mmol/L  Comment: CRITICAL RESULT CALLED TO, READ BACK BY AND VERIFIED WITH AMBER COLLINS 08/05/17 2000 KLW Performed at [facility], [address] [place], [place] [address]   Basic metabolic panel     Status: Abnormal   Collection Time: 08/05/17  7:16 PM  Result Value Ref Range   Sodium 136 135 - 145 mmol/L   Potassium 5.8 (H) 3.5 - 5.1 mmol/L   Chloride 104 101 - 111 mmol/L   CO2 16 (L) 22 - 32 mmol/L   Glucose, Bld 107 (H) 65 - 99 mg/dL   BUN 100 (H) 6 - 20 mg/dL    Comment: RESULT CONFIRMED BY  MANUAL DILUTION KLW / JJB   Creatinine, Ser 3.40 (H) 0.61 - 1.24 mg/dL   Calcium 6.5 (L) 8.9 - 10.3 mg/dL   GFR calc non Af Amer 18 (L) >60 mL/min   GFR calc Af Amer 21 (L) >60 mL/min    Comment: (NOTE) The eGFR has been calculated using the CKD EPI equation. This calculation has not been validated in all clinical situations. eGFR's persistently <60 mL/min signify possible Chronic Kidney Disease.    Anion gap 16 (H) 5 - 15    Comment: Performed at [facility], [address] [place], [place] [address]  CBC with Differential/Platelet     Status: Abnormal   Collection Time: 08/05/17  7:16 PM  Result Value Ref Range   WBC 15.0 (H) 3.8 - 10.6 K/uL   RBC 3.85 (L) 4.40 - 5.90 MIL/uL   Hemoglobin 9.0 (L) 13.0 - 18.0 g/dL   HCT 29.0 (L) 40.0 - 52.0 %   MCV 75.4 (L) 80.0 - 100.0 fL   MCH 23.3 (L) 26.0 - 34.0 pg   MCHC 30.9 (L) 32.0 - 36.0 g/dL   RDW 18.4 (H) 11.5 - 14.5 %   Platelets 188 150 - 440 K/uL   Neutrophils Relative % 65 %   Lymphocytes Relative 7 %   Monocytes Relative 10 %   Eosinophils Relative 0 %   Basophils Relative 0 %   Band Neutrophils 18 %   Metamyelocytes Relative 0 %   Myelocytes 0 %   Promyelocytes Absolute 0 %   Blasts 0 %   nRBC 1 (H) 0 /100 WBC   Other 0 %   RBC Morphology MIXED RBC POPULATION    Smear Review      PLATELET CLUMPS NOTED ON SMEAR, COUNT APPEARS ADEQUATE   Neutro Abs 12.4 (H) 1.4 - 6.5 K/uL   Lymphs Abs 1.1 1.0 - 3.6 K/uL   Monocytes Absolute 1.5 (H) 0.2 - 1.0 K/uL   Eosinophils Absolute 0.0 0 - 0.7 K/uL   Basophils Absolute 0.0 0 - 0.1 K/uL    Comment: Performed at [facility], [address] [place], [place] [address]  Blood gas, arterial     Status: Abnormal   Collection Time: 08/05/17  7:18 PM  Result Value Ref Range   FIO2 0.40    Delivery systems VENTILATOR    Mode PRESSURE REGULATED VOLUME CONTROL    VT 550 mL   LHR 22 resp/min   Peep/cpap 5.0 cm H20   pH, Arterial 7.29 (L) 7.350 - 7.450   pCO2  arterial 35 32.0 - 48.0 mmHg   pO2, Arterial 75 (L) 83.0 - 108.0 mmHg   Bicarbonate 16.8 (L) 20.0 - 28.0 mmol/L   Acid-base deficit 9.0 (H) 0.0 - 2.0 mmol/L   O2 Saturation 93.0 %   Patient temperature 37.0  Collection site RIGHT RADIAL    Sample type ARTERIAL DRAW    Allens test (pass/fail) POSITIVE (A) PASS    Comment: Performed at [facility], [address] [place], [place] [address]  Glucose, capillary     Status: Abnormal   Collection Time: 08/05/17  8:28 PM  Result Value Ref Range   Glucose-Capillary 101 (H) 65 - 99 mg/dL  Lactic acid, plasma     Status: Abnormal   Collection Time: 08/05/17 10:21 PM  Result Value Ref Range   Lactic Acid, Venous 2.3 (HH) 0.5 - 1.9 mmol/L    Comment: CRITICAL RESULT CALLED TO, READ BACK BY AND VERIFIED WITH AMBER COLLIN ON 08/05/17 AT 2304 BY JAG Performed at [facility], [address] [place], [place] [address]   Glucose, capillary     Status: Abnormal   Collection Time: 08/05/17 11:45 PM  Result Value Ref Range   Glucose-Capillary 111 (H) 65 - 99 mg/dL  Glucose, capillary     Status: Abnormal   Collection Time: 08/06/17  3:41 AM  Result Value Ref Range   Glucose-Capillary 110 (H) 65 - 99 mg/dL  Blood gas, arterial     Status: Abnormal (Preliminary result)   Collection Time: 08/06/17  4:21 AM  Result Value Ref Range   FIO2 40.00    Mode PRESSURE REGULATED VOLUME CONTROL    VT 550 mL   Peep/cpap 5.0 cm H20   pH, Arterial 7.32 (L) 7.350 - 7.450   pCO2 arterial 33 32.0 - 48.0 mmHg   pO2, Arterial 94 83.0 - 108.0 mmHg   Bicarbonate 17.0 (L) 20.0 - 28.0 mmol/L   Acid-base deficit 8.3 (H) 0.0 - 2.0 mmol/L   O2 Saturation 96.6 %   Patient temperature 37.0    Collection site PENDING    Sample type ARTERIAL DRAW    Allens test (pass/fail) PASS PASS   Mechanical Rate 22     Comment: Performed at [facility], [address] [place], [place] [address]  Procalcitonin     Status: None   Collection Time:  08/06/17  5:59 AM  Result Value Ref Range   Procalcitonin 34.92 ng/mL    Comment:        Interpretation: PCT >= 10 ng/mL: Important systemic inflammatory response, almost exclusively due to severe bacterial sepsis or septic shock. (NOTE)       Sepsis PCT Algorithm           Lower Respiratory Tract                                      Infection PCT Algorithm    ----------------------------     ----------------------------         PCT < 0.25 ng/mL                PCT < 0.10 ng/mL         Strongly encourage             Strongly discourage   discontinuation of antibiotics    initiation of antibiotics    ----------------------------     -----------------------------       PCT 0.25 - 0.50 ng/mL            PCT 0.10 - 0.25 ng/mL               OR       >80% decrease in  PCT            Discourage initiation of                                            antibiotics      Encourage discontinuation           of antibiotics    ----------------------------     -----------------------------         PCT >= 0.50 ng/mL              PCT 0.26 - 0.50 ng/mL                AND       <80% decrease in PCT             Encourage initiation of                                             antibiotics       Encourage continuation           of antibiotics    ----------------------------     -----------------------------        PCT >= 0.50 ng/mL                  PCT > 0.50 ng/mL               AND         increase in PCT                  Strongly encourage                                      initiation of antibiotics    Strongly encourage escalation           of antibiotics                                     -----------------------------                                           PCT <= 0.25 ng/mL                                                 OR                                        > 80% decrease in PCT                                     Discontinue / Do not initiate  antibiotics Performed at [facility], [address] [place], [place] [address]   CBC with Differential/Platelet     Status: Abnormal   Collection Time: 08/06/17  5:59 AM  Result Value Ref Range   WBC 11.1 (H) 3.8 - 10.6 K/uL   RBC 3.19 (L) 4.40 - 5.90 MIL/uL   Hemoglobin 7.5 (L) 13.0 - 18.0 g/dL   HCT 23.7 (L) 40.0 - 52.0 %   MCV 74.4 (L) 80.0 - 100.0 fL   MCH 23.4 (L) 26.0 - 34.0 pg   MCHC 31.4 (L) 32.0 - 36.0 g/dL   RDW 18.2 (H) 11.5 - 14.5 %   Platelets 155 150 - 440 K/uL   Neutrophils Relative % 35 %   Lymphocytes Relative 23 %   Monocytes Relative 12 %   Eosinophils Relative 0 %   Basophils Relative 0 %   Band Neutrophils 26 %   Metamyelocytes Relative 3 %   Myelocytes 1 %   Promyelocytes Absolute 0 %   Blasts 0 %   nRBC 0 0 /100 WBC   Other 0 %   Neutro Abs 7.2 (H) 1.4 - 6.5 K/uL   Lymphs Abs 2.6 1.0 - 3.6 K/uL   Monocytes Absolute 1.3 (H) 0.2 - 1.0 K/uL   Eosinophils Absolute 0.0 0 - 0.7 K/uL   Basophils Absolute 0.0 0 - 0.1 K/uL   Smear Review MORPHOLOGY UNREMARKABLE     Comment: PLATELET CLUMPS NOTED ON SMEAR, COUNT APPEARS ADEQUATE Performed at [facility], [address] [place], [place] [address]   Comprehensive metabolic panel     Status: Abnormal   Collection Time: 08/06/17  5:59 AM  Result Value Ref Range   Sodium 137 135 - 145 mmol/L   Potassium 5.3 (H) 3.5 - 5.1 mmol/L   Chloride 108 101 - 111 mmol/L   CO2 17 (L) 22 - 32 mmol/L   Glucose, Bld 124 (H) 65 - 99 mg/dL   BUN 111 (H) 6 - 20 mg/dL    Comment: RESULT CONFIRMED BY MANUAL DILUTION AKT   Creatinine, Ser 3.55 (H) 0.61 - 1.24 mg/dL   Calcium 6.6 (L) 8.9 - 10.3 mg/dL   Total Protein 4.6 (L) 6.5 - 8.1 g/dL   Albumin 1.7 (L) 3.5 - 5.0 g/dL   AST 55 (H) 15 - 41 U/L   ALT 29 17 - 63 U/L   Alkaline Phosphatase 138 (H) 38 - 126 U/L   Total Bilirubin 2.7 (H) 0.3 - 1.2 mg/dL   GFR calc non Af Amer 17 (L) >60 mL/min   GFR calc Af Amer 20 (L) >60 mL/min    Comment:  (NOTE) The eGFR has been calculated using the CKD EPI equation. This calculation has not been validated in all clinical situations. eGFR's persistently <60 mL/min signify possible Chronic Kidney Disease.    Anion gap 12 5 - 15    Comment: Performed at [facility], [address] [place], [place] [address]  Magnesium     Status: None   Collection Time: 08/06/17  5:59 AM  Result Value Ref Range   Magnesium 2.3 1.7 - 2.4 mg/dL    Comment: Performed at [facility], [address] [place], [place] [address]  Phosphorus     Status: Abnormal   Collection Time: 08/06/17  5:59 AM  Result Value Ref Range   Phosphorus 7.0 (H) 2.5 - 4.6 mg/dL    Comment: Performed at [facility], [address] [place], [place] [address]  Calcium, ionized     Status: Abnormal   Collection Time: 08/06/17  5:59 AM  Result Value Ref Range   Calcium, Ionized, Serum 3.8 (L) 4.5 - 5.6 mg/dL    Comment: (NOTE) Performed At: [facility] [place], [place] [id number] Rush Farmer MD [phone] Performed at [facility], [address] [place], [place] [address]   Ammonia     Status: Abnormal   Collection Time: 08/06/17  5:59 AM  Result Value Ref Range   Ammonia 68 (H) 9 - 35 umol/L    Comment: Performed at [facility], [address] [place], [place] [address]  Glucose, capillary     Status: Abnormal   Collection Time: 08/06/17  7:34 AM  Result Value Ref Range   Glucose-Capillary 107 (H) 65 - 99 mg/dL  Glucose, capillary     Status: Abnormal   Collection Time: 08/06/17  7:56 AM  Result Value Ref Range   Glucose-Capillary 108 (H) 65 - 99 mg/dL  Lactic acid, plasma     Status: None   Collection Time: 08/06/17  9:19 AM  Result Value Ref Range   Lactic Acid, Venous 1.3 0.5 - 1.9 mmol/L    Comment: Performed at [facility], [address] [place], [place] [address]  CULTURE, BLOOD (ROUTINE X 2) w Reflex to ID Panel      Status: None (Preliminary result)   Collection Time: 08/06/17 10:03 AM  Result Value Ref Range   Specimen Description BLOOD LEFT HAND    Special Requests      BOTTLES DRAWN AEROBIC AND ANAEROBIC Blood Culture results may not be optimal due to an excessive volume of blood received in culture bottles   Culture      NO GROWTH < 24 HOURS Performed at [facility], [address], [place], [place] [address]    Report Status PENDING   CULTURE, BLOOD (ROUTINE X 2) w Reflex to ID Panel     Status: None (Preliminary result)   Collection Time: 08/06/17 10:03 AM  Result Value Ref Range   Specimen Description BLOOD LEFT WRIST    Special Requests      BOTTLES DRAWN AEROBIC AND ANAEROBIC Blood Culture adequate volume   Culture      NO GROWTH < 24 HOURS Performed at [facility], [address], [place], [place] [address]    Report Status PENDING   Urine Culture     Status: None   Collection Time: 08/06/17 10:25 AM  Result Value Ref Range   Specimen Description      URINE, RANDOM Performed at [facility], [address] [place], [place] [address]    Special Requests      NONE Performed at [facility], [address], [place], [place] [address]    Culture      NO GROWTH Performed at [facility], [address] [place], [place] [address]    Report Status 08/07/2017 FINAL   Glucose, capillary     Status: Abnormal   Collection Time: 08/06/17 11:41 AM  Result Value Ref Range   Glucose-Capillary 122 (H) 65 - 99 mg/dL  Lactic acid, plasma     Status: None   Collection Time: 08/06/17 12:40 PM  Result Value Ref Range   Lactic Acid, Venous 1.4 0.5 - 1.9 mmol/L    Comment: Performed at [facility], [address] [place], [place] [address]  Basic metabolic panel  Status: Abnormal   Collection Time: 08/06/17 12:40 PM  Result Value Ref Range   Sodium 137 135 - 145 mmol/L   Potassium 5.1 3.5 - 5.1 mmol/L   Chloride 109 101 -  111 mmol/L   CO2 17 (L) 22 - 32 mmol/L   Glucose, Bld 134 (H) 65 - 99 mg/dL   BUN 115 (H) 6 - 20 mg/dL    Comment: RESULT CONFIRMED BY MANUAL DILUTION SDR   Creatinine, Ser 3.72 (H) 0.61 - 1.24 mg/dL   Calcium 6.8 (L) 8.9 - 10.3 mg/dL   GFR calc non Af Amer 16 (L) >60 mL/min   GFR calc Af Amer 19 (L) >60 mL/min    Comment: (NOTE) The eGFR has been calculated using the CKD EPI equation. This calculation has not been validated in all clinical situations. eGFR's persistently <60 mL/min signify possible Chronic Kidney Disease.    Anion gap 11 5 - 15    Comment: Performed at [facility], [address] [place], [place] [address]  Magnesium     Status: Abnormal   Collection Time: 08/06/17 12:40 PM  Result Value Ref Range   Magnesium 2.7 (H) 1.7 - 2.4 mg/dL    Comment: Performed at [facility], [address] [place], [place] [address]  Phosphorus     Status: Abnormal   Collection Time: 08/06/17 12:40 PM  Result Value Ref Range   Phosphorus 7.4 (H) 2.5 - 4.6 mg/dL    Comment: Performed at [facility], [address] [place], [place] [address]  Calcium, ionized     Status: Abnormal   Collection Time: 08/06/17 12:40 PM  Result Value Ref Range   Calcium, Ionized, Serum 3.8 (L) 4.5 - 5.6 mg/dL    Comment: (NOTE) Performed At: [facility] [place], [place] [id number] Rush Farmer MD [phone] Performed at [facility], [address] [place], [place] [address]   Triglycerides     Status: Abnormal   Collection Time: 08/06/17 12:48 PM  Result Value Ref Range   Triglycerides 188 (H) <150 mg/dL    Comment: Performed at [facility], [address] [place], [place] [address]  Protime-INR     Status: Abnormal   Collection Time: 08/06/17  2:05 PM  Result Value Ref Range   Prothrombin Time 16.8 (H) 11.4 - 15.2 seconds   INR 1.37     Comment: Performed at [facility], [address]  [place], [place] [address]  APTT     Status: None   Collection Time: 08/06/17  2:05 PM  Result Value Ref Range   aPTT 32 24 - 36 seconds    Comment: Performed at [facility], [address] [place], [place] [address]  Glucose, capillary     Status: Abnormal   Collection Time: 08/06/17  4:25 PM  Result Value Ref Range   Glucose-Capillary 153 (H) 65 - 99 mg/dL  Glucose, capillary     Status: Abnormal   Collection Time: 08/06/17  8:00 PM  Result Value Ref Range   Glucose-Capillary 201 (H) 65 - 99 mg/dL  Glucose, capillary     Status: Abnormal   Collection Time: 08/06/17 11:55 PM  Result Value Ref Range   Glucose-Capillary 186 (H) 65 - 99 mg/dL  Glucose, capillary     Status: Abnormal   Collection Time: 08/07/17  3:57 AM  Result Value Ref Range   Glucose-Capillary 193 (H) 65 - 99 mg/dL  Blood gas, arterial  Status: Abnormal   Collection Time: 08/07/17  4:45 AM  Result Value Ref Range   FIO2 0.40    Delivery systems VENTILATOR    Mode PRESSURE REGULATED VOLUME CONTROL    VT 550 mL   LHR 22 resp/min   Peep/cpap 5.0 cm H20   pH, Arterial 7.43 7.350 - 7.450   pCO2 arterial 38 32.0 - 48.0 mmHg   pO2, Arterial 122 (H) 83.0 - 108.0 mmHg   Bicarbonate 25.2 20.0 - 28.0 mmol/L   Acid-Base Excess 1.0 0.0 - 2.0 mmol/L   O2 Saturation 98.8 %   Patient temperature 37.0    Collection site RIGHT RADIAL    Sample type ARTERIAL DRAW    Allens test (pass/fail) PASS PASS    Comment: Performed at [facility], [address] [place], [place] [address]  Procalcitonin     Status: None   Collection Time: 08/07/17  6:46 AM  Result Value Ref Range   Procalcitonin 18.25 ng/mL    Comment:        Interpretation: PCT >= 10 ng/mL: Important systemic inflammatory response, almost exclusively due to severe bacterial sepsis or septic shock. (NOTE)       Sepsis PCT Algorithm           Lower Respiratory Tract                                      Infection PCT Algorithm     ----------------------------     ----------------------------         PCT < 0.25 ng/mL                PCT < 0.10 ng/mL         Strongly encourage             Strongly discourage   discontinuation of antibiotics    initiation of antibiotics    ----------------------------     -----------------------------       PCT 0.25 - 0.50 ng/mL            PCT 0.10 - 0.25 ng/mL               OR       >80% decrease in PCT            Discourage initiation of                                            antibiotics      Encourage discontinuation           of antibiotics    ----------------------------     -----------------------------         PCT >= 0.50 ng/mL              PCT 0.26 - 0.50 ng/mL                AND       <80% decrease in PCT             Encourage initiation of  antibiotics       Encourage continuation           of antibiotics    ----------------------------     -----------------------------        PCT >= 0.50 ng/mL                  PCT > 0.50 ng/mL               AND         increase in PCT                  Strongly encourage                                      initiation of antibiotics    Strongly encourage escalation           of antibiotics                                     -----------------------------                                           PCT <= 0.25 ng/mL                                                 OR                                        > 80% decrease in PCT                                     Discontinue / Do not initiate                                             antibiotics Performed at [facility], [address] [place], [place] [address]   CBC with Differential/Platelet     Status: Abnormal   Collection Time: 08/07/17  6:46 AM  Result Value Ref Range   WBC 9.6 3.8 - 10.6 K/uL   RBC 3.09 (L) 4.40 - 5.90 MIL/uL   Hemoglobin 7.2 (L) 13.0 - 18.0 g/dL   HCT 22.6 (L) 40.0 - 52.0 %   MCV 73.1 (L) 80.0 -  100.0 fL   MCH 23.3 (L) 26.0 - 34.0 pg   MCHC 31.9 (L) 32.0 - 36.0 g/dL   RDW 17.9 (H) 11.5 - 14.5 %   Platelets 137 (L) 150 - 440 K/uL   Neutrophils Relative % 84 %   Neutro Abs 8.1 (H) 1.4 - 6.5 K/uL   Lymphocytes Relative 9 %   Lymphs Abs 0.9 (L) 1.0 - 3.6 K/uL   Monocytes Relative 6 %   Monocytes Absolute 0.6 0.2 - 1.0 K/uL   Eosinophils Relative 1 %  Eosinophils Absolute 0.1 0 - 0.7 K/uL   Basophils Relative 0 %   Basophils Absolute 0.0 0 - 0.1 K/uL    Comment: Performed at [facility], [address] [place], [place] [address]  Ammonia     Status: Abnormal   Collection Time: 08/07/17  6:46 AM  Result Value Ref Range   Ammonia 42 (H) 9 - 35 umol/L    Comment: Performed at [facility], [address] [place], [place] [address]  Hepatic function panel     Status: Abnormal   Collection Time: 08/07/17  6:46 AM  Result Value Ref Range   Total Protein 5.2 (L) 6.5 - 8.1 g/dL   Albumin 1.9 (L) 3.5 - 5.0 g/dL   AST 42 (H) 15 - 41 U/L   ALT 24 17 - 63 U/L   Alkaline Phosphatase 151 (H) 38 - 126 U/L   Total Bilirubin 3.1 (H) 0.3 - 1.2 mg/dL   Bilirubin, Direct 1.9 (H) 0.1 - 0.5 mg/dL   Indirect Bilirubin 1.2 (H) 0.3 - 0.9 mg/dL    Comment: Performed at [facility], [address] [place], [place] [address]  Basic metabolic panel     Status: Abnormal   Collection Time: 08/07/17  6:46 AM  Result Value Ref Range   Sodium 142 135 - 145 mmol/L   Potassium 4.3 3.5 - 5.1 mmol/L   Chloride 107 101 - 111 mmol/L   CO2 25 22 - 32 mmol/L   Glucose, Bld 202 (H) 65 - 99 mg/dL   BUN 94 (H) 6 - 20 mg/dL   Creatinine, Ser 2.63 (H) 0.61 - 1.24 mg/dL   Calcium 6.9 (L) 8.9 - 10.3 mg/dL   GFR calc non Af Amer 25 (L) >60 mL/min   GFR calc Af Amer 29 (L) >60 mL/min    Comment: (NOTE) The eGFR has been calculated using the CKD EPI equation. This calculation has not been validated in all clinical situations. eGFR's persistently <60 mL/min signify possible  Chronic Kidney Disease.    Anion gap 10 5 - 15    Comment: Performed at [facility], [address] [place], [place] [address]  Magnesium     Status: Abnormal   Collection Time: 08/07/17  6:46 AM  Result Value Ref Range   Magnesium 2.5 (H) 1.7 - 2.4 mg/dL    Comment: Performed at [facility], [address] [place], [place] [address]  Phosphorus     Status: Abnormal   Collection Time: 08/07/17  6:46 AM  Result Value Ref Range   Phosphorus 5.0 (H) 2.5 - 4.6 mg/dL    Comment: Performed at [facility], [address] [place], [place] [address]  Glucose, capillary     Status: Abnormal   Collection Time: 08/07/17  7:29 AM  Result Value Ref Range   Glucose-Capillary 174 (H) 65 - 99 mg/dL   No components found for: ESR, C REACTIVE PROTEIN MICRO: Recent Results (from the past 720 hour(s))  Aerobic/Anaerobic Culture (surgical/deep wound)     Status: Abnormal (Preliminary result)   Collection Time: 08/05/17 12:17 AM  Result Value Ref Range Status   Specimen Description   Final    PERITONEAL Performed at [facility], [address] [place], [place] [address]    Special Requests   Final    NONE Performed at [facility], [address] [place],  [address]    Gram Stain   Final    FEW WBC  PRESENT, PREDOMINANTLY PMN ABUNDANT GRAM NEGATIVE RODS ABUNDANT GRAM POSITIVE COCCI MODERATE GRAM POSITIVE RODS Performed at [facility], [place] [address] [place], [place] [address]    Culture (A)  Final    MULTIPLE ORGANISMS PRESENT, NONE PREDOMINANT NO ANAEROBES ISOLATED; CULTURE IN PROGRESS FOR 5 DAYS    Report Status PENDING  Incomplete  MRSA PCR Screening     Status: None   Collection Time: 08/05/17  4:08 AM  Result Value Ref Range Status   MRSA by PCR NEGATIVE NEGATIVE Final    Comment:        The GeneXpert MRSA Assay (FDA approved for NASAL specimens only), is one component of a comprehensive MRSA  colonization surveillance program. It is not intended to diagnose MRSA infection nor to guide or monitor treatment for MRSA infections. Performed at [facility], [address] [place], [place] [address]   CULTURE, BLOOD (ROUTINE X 2) w Reflex to ID Panel     Status: None (Preliminary result)   Collection Time: 08/06/17 10:03 AM  Result Value Ref Range Status   Specimen Description BLOOD LEFT HAND  Final   Special Requests   Final    BOTTLES DRAWN AEROBIC AND ANAEROBIC Blood Culture results may not be optimal due to an excessive volume of blood received in culture bottles   Culture   Final    NO GROWTH < 24 HOURS Performed at [facility], [address], [place], [place] [address]    Report Status PENDING  Incomplete  CULTURE, BLOOD (ROUTINE X 2) w Reflex to ID Panel     Status: None (Preliminary result)   Collection Time: 08/06/17 10:03 AM  Result Value Ref Range Status   Specimen Description BLOOD LEFT WRIST  Final   Special Requests   Final    BOTTLES DRAWN AEROBIC AND ANAEROBIC Blood Culture adequate volume   Culture   Final    NO GROWTH < 24 HOURS Performed at [facility], [address], [place], [place] [address]    Report Status PENDING  Incomplete  Urine Culture     Status: None   Collection Time: 08/06/17 10:25 AM  Result Value Ref Range Status   Specimen Description   Final    URINE, RANDOM Performed at [facility], [address], [place], [place] [address]    Special Requests   Final    NONE Performed at [facility], [address], [place], [place] [address]    Culture   Final    NO GROWTH Performed at [facility], [place] [address], [place], [place] [address]    Report Status 08/07/2017 FINAL  Final    IMAGING: Ct Abdomen Pelvis Wo Contrast  Result Date: 08/25/2017 CLINICAL DATA:  Abdominal pain. EXAM: CT ABDOMEN AND PELVIS WITHOUT CONTRAST TECHNIQUE: Multidetector CT imaging of the abdomen  and pelvis was performed following the standard protocol without IV contrast. COMPARISON:  None. FINDINGS: Lower chest: Bibasilar atelectasis. Hepatobiliary: Subcentimeter low-density lesion in the left liver is too small to characterize. Cholelithiasis. No gallbladder wall thickening or biliary dilatation. Pancreas: Unremarkable. No pancreatic ductal dilatation or surrounding inflammatory changes. Spleen: Normal in size without focal abnormality. Adrenals/Urinary Tract: Thickening of the bilateral adrenal glands without discrete nodule. Exophytic, subcentimeter low-density lesion arising from the left kidney, too small to characterize. No renal or ureteral calculi. Moderate diffuse bladder wall thickening may be related to underdistention. Stomach/Bowel: Prominent sigmoid diverticulosis. Mild wall thickening of the proximal sigmoid  colon. Prominent thinning of the anterior wall of the proximal transverse colon (series 3, image 48). No bowel obstruction. Normal appendix. Vascular/Lymphatic: No significant vascular findings are present. No enlarged abdominal or pelvic lymph nodes. Reproductive: Mild prostatomegaly. Other: Large amount of pneumoperitoneum. Small ascites. Fat containing umbilical and left inguinal hernias. Musculoskeletal: No acute or significant osseous findings. Severe right hip osteoarthritis. IMPRESSION: 1. Large amount of pneumoperitoneum and small ascites, consistent with bowel perforation. Location of perforation is not definitively identified. Critical Value/emergent results were called by telephone at the time of interpretation on 08/24/2017 at 10:29 pm to Dr. Nance Pear , who verbally acknowledged these results. Electronically Signed   By: Titus Dubin M.D.   On: 08/31/2017 22:32   Dg Abd 1 View  Result Date: 08/05/2017 CLINICAL DATA:  57 year old male status post NGT placement. EXAM: PORTABLE CHEST 1 VIEW COMPARISON:  Abdominal CT dated 08/28/2017 FINDINGS: An endotracheal tube is  seen with tip approximately 3 cm above the carina. An enteric tube extends into the left hemiabdomen with tip likely in the mid stomach. Shallow inspiration with bibasilar atelectatic changes. Pneumonia is not excluded. No large pleural effusion. There is no pneumothorax. Evaluation of the abdomen is limited on the provided images and secondary to body habitus. There degenerative changes of the spine. No acute osseous pathology. IMPRESSION: 1. Endotracheal tube above the carina and enteric tube likely in the stomach. 2. Shallow inspiration with bibasilar atelectasis/infiltrate. Electronically Signed   By: Anner Crete M.D.   On: 08/05/2017 04:41   Ct Head Wo Contrast  Result Date: 08/12/2017 CLINICAL DATA:  Altered mental status, abdominal pain. Renal insufficiency. EXAM: CT HEAD WITHOUT CONTRAST TECHNIQUE: Contiguous axial images were obtained from the base of the skull through the vertex without intravenous contrast. COMPARISON:  None. FINDINGS: BRAIN: No intraparenchymal hemorrhage, mass effect nor midline shift. The ventricles and sulci are normal for age. 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 No acute large vascular territory infarcts. No abnormal extra-axial fluid collections. Basal cisterns are patent. VASCULAR: Trace calcific atherosclerosis of the carotid siphons. SKULL: No skull fracture. Old slightly depressed LEFT nasal bone fracture. No significant scalp soft tissue swelling. SINUSES/ORBITS: Trace paranasal sinus mucosal thickening with LEFT maxillary mucosal retention cyst. Mastoid air cells are well aerated. Soft tissue within the bilateral external auditory canals most compatible with cerumen.The included ocular globes and orbital contents are non-suspicious. OTHER: None. IMPRESSION: 1. No acute intracranial process. 2. 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 Electronically Signed   By:  Elon Alas M.D.   On: 08/15/2017 22:20   US Renal  Result Date: 08/06/2017 CLINICAL DATA:  Acute kidney failure. EXAM: RENAL / URINARY TRACT ULTRASOUND COMPLETE COMPARISON:  CT 09/02/2017 FINDINGS: Right Kidney: Length: 11.9 cm. Echogenicity within normal limits. No mass or hydronephrosis visualized. Left Kidney: Length: 12.9 cm. Echogenicity within normal limits. No mass or hydronephrosis visualized. Bladder: Foley catheter in the bladder. Ascites noted in the left upper quadrant. IMPRESSION: Negative ultrasound. Normal size kidneys. No hydronephrosis. Normal echogenicity. Electronically Signed   By: Nelson Chimes M.D.   On: 08/06/2017 14:04   Dg Chest Port 1 View  Result Date: 08/07/2017 CLINICAL DATA:  Pneumonia EXAM: PORTABLE CHEST 1 VIEW COMPARISON:  08/06/2017 FINDINGS: Endotracheal tube in good position. Right jugular catheter tip in the right atrium. Hypoventilation with low lung volumes and bibasilar atelectasis. Progression of bibasilar atelectasis since yesterday. Small effusions. IMPRESSION: Hypoventilation with progression of bibasilar  atelectasis. Electronically Signed   By: Franchot Gallo M.D.   On: 08/07/2017 07:12   Dg Chest Port 1 View  Result Date: 08/06/2017 CLINICAL DATA:  Central line placement. EXAM: PORTABLE CHEST 1 VIEW COMPARISON:  One-view chest x-ray 08/06/2017 at 3:55 a.m. FINDINGS: The heart size is exaggerate by low lung volumes. Endotracheal tube remains 2.5 cm above the carina. NG tube terminates in the fundus of the stomach. Bilateral pleural effusions and atelectasis are noted. A new right IJ line is in place. The tip is at the cavoatrial junction. There is no pneumothorax. IMPRESSION: 1. Interval placement of right IJ line without radiographic evidence for complication. The tip is at the cavoatrial junction. 2. Persistent low lung volumes and small bilateral pleural effusions. 3. Support apparatus is otherwise stable. Electronically Signed   By: San Morelle  M.D.   On: 08/06/2017 16:10   Dg Chest Port 1 View  Result Date: 08/06/2017 CLINICAL DATA:  Hypoxia EXAM: PORTABLE CHEST 1 VIEW COMPARISON:  August 05, 2017 FINDINGS: Endotracheal tube tip is 2.4 cm above the carina. Nasogastric tube tip and side port are in the stomach. No pneumothorax. There is bibasilar atelectasis. Lungs elsewhere are clear. Heart is upper normal in size with pulmonary vascularity within normal limits. No evident adenopathy. No bone lesions. IMPRESSION: Tube positions as described without pneumothorax. Bibasilar atelectasis. Stable cardiac silhouette. Electronically Signed   By: Lowella Grip III M.D.   On: 08/06/2017 07:41   Dg Chest Port 1 View  Result Date: 08/05/2017 CLINICAL DATA:  57 year old male status post NGT placement. EXAM: PORTABLE CHEST 1 VIEW COMPARISON:  Abdominal CT dated 08/22/2017 FINDINGS: An endotracheal tube is seen with tip approximately 3 cm above the carina. An enteric tube extends into the left hemiabdomen with tip likely in the mid stomach. Shallow inspiration with bibasilar atelectatic changes. Pneumonia is not excluded. No large pleural effusion. There is no pneumothorax. Evaluation of the abdomen is limited on the provided images and secondary to body habitus. There degenerative changes of the spine. No acute osseous pathology. IMPRESSION: 1. Endotracheal tube above the carina and enteric tube likely in the stomach. 2. Shallow inspiration with bibasilar atelectasis/infiltrate. Electronically Signed   By: Anner Crete M.D.   On: 08/05/2017 04:41    Assessment:   Yaron [place] is a 57 y.o. male with no medical history admitted with 3-5 days abd pain and found to have Pneumoperitoneum, sepsis, now s/p ex lap with purulent findings but no obvious source, culture mixed. Critically ill, on pressors, intubated. ARF and elevated LFTs.  Drainage from midline incision is serous and does not appear infected.  Recommendations Cont zosyn Can continue  antifungal coverage but change to fluconazole  Thank you very much for allowing me to participate in the care of this patient. Please call with questions.   Cheral Marker. Ola Spurr, MD

## 2017-08-08 ENCOUNTER — Inpatient Hospital Stay: Payer: Medicaid Other

## 2017-08-08 LAB — BASIC METABOLIC PANEL
ANION GAP: 7 (ref 5–15)
BUN: 71 mg/dL — ABNORMAL HIGH (ref 6–20)
CALCIUM: 7.2 mg/dL — AB (ref 8.9–10.3)
CO2: 28 mmol/L (ref 22–32)
Chloride: 110 mmol/L (ref 101–111)
Creatinine, Ser: 1.83 mg/dL — ABNORMAL HIGH (ref 0.61–1.24)
GFR, EST AFRICAN AMERICAN: 45 mL/min — AB (ref >60)
GFR, EST NON AFRICAN AMERICAN: 39 mL/min — AB (ref >60)
Glucose, Bld: 156 mg/dL — ABNORMAL HIGH (ref 65–99)
POTASSIUM: 3.9 mmol/L (ref 3.5–5.1)
SODIUM: 145 mmol/L (ref 135–145)

## 2017-08-08 LAB — BLOOD GAS, ARTERIAL
ACID-BASE EXCESS: 5.1 mmol/L — AB (ref 0.0–2.0)
Acid-Base Excess: 4.4 mmol/L — ABNORMAL HIGH (ref 0.0–2.0)
BICARBONATE: 27.6 mmol/L (ref 20.0–28.0)
BICARBONATE: 28.4 mmol/L — AB (ref 20.0–28.0)
FIO2: 0.3
FIO2: 0.4
LHR: 18 {breaths}/min
MECHVT: 550 mL
O2 SAT: 96.7 %
O2 Saturation: 98.7 %
PCO2 ART: 33 mmHg (ref 32.0–48.0)
PEEP/CPAP: 5 cmH2O
PEEP/CPAP: 5 cmH2O
PH ART: 7.53 — AB (ref 7.350–7.450)
Patient temperature: 37
Patient temperature: 38.3
RATE: 22 resp/min
VT: 550 mL
pCO2 arterial: 39 mmHg (ref 32.0–48.0)
pH, Arterial: 7.45 (ref 7.350–7.450)
pO2, Arterial: 77 mmHg — ABNORMAL LOW (ref 83.0–108.0)
pO2, Arterial: 121 mmHg — ABNORMAL HIGH (ref 83.0–108.0)

## 2017-08-08 LAB — HEPATIC FUNCTION PANEL
ALT: 22 U/L (ref 17–63)
AST: 33 U/L (ref 15–41)
Albumin: 1.9 g/dL — ABNORMAL LOW (ref 3.5–5.0)
Alkaline Phosphatase: 135 U/L — ABNORMAL HIGH (ref 38–126)
Bilirubin, Direct: 1.4 mg/dL — ABNORMAL HIGH (ref 0.1–0.5)
Indirect Bilirubin: 1.2 mg/dL — ABNORMAL HIGH (ref 0.3–0.9)
TOTAL PROTEIN: 5.2 g/dL — AB (ref 6.5–8.1)
Total Bilirubin: 2.6 mg/dL — ABNORMAL HIGH (ref 0.3–1.2)

## 2017-08-08 LAB — GLUCOSE, CAPILLARY
GLUCOSE-CAPILLARY: 126 mg/dL — AB (ref 65–99)
GLUCOSE-CAPILLARY: 126 mg/dL — AB (ref 65–99)
GLUCOSE-CAPILLARY: 130 mg/dL — AB (ref 65–99)
Glucose-Capillary: 134 mg/dL — ABNORMAL HIGH (ref 65–99)
Glucose-Capillary: 63 mg/dL — ABNORMAL LOW (ref 65–99)

## 2017-08-08 LAB — CBC
HCT: 21.7 % — ABNORMAL LOW (ref 40.0–52.0)
Hemoglobin: 7 g/dL — ABNORMAL LOW (ref 13.0–18.0)
MCH: 23.6 pg — ABNORMAL LOW (ref 26.0–34.0)
MCHC: 32 g/dL (ref 32.0–36.0)
MCV: 73.8 fL — ABNORMAL LOW (ref 80.0–100.0)
PLATELETS: 138 10*3/uL — AB (ref 150–440)
RBC: 2.95 MIL/uL — ABNORMAL LOW (ref 4.40–5.90)
RDW: 17.6 % — AB (ref 11.5–14.5)
WBC: 9.6 10*3/uL (ref 3.8–10.6)

## 2017-08-08 LAB — CALCIUM, IONIZED
CALCIUM, IONIZED, SERUM: 4.3 mg/dL — AB (ref 4.5–5.6)
Calcium, Ionized, Serum: 4.2 mg/dL — ABNORMAL LOW (ref 4.5–5.6)

## 2017-08-08 LAB — ABO/RH: ABO/RH(D): O POS

## 2017-08-08 LAB — PHOSPHORUS: PHOSPHORUS: 3.9 mg/dL (ref 2.5–4.6)

## 2017-08-08 LAB — AMMONIA: Ammonia: 36 umol/L — ABNORMAL HIGH (ref 9–35)

## 2017-08-08 LAB — MAGNESIUM: MAGNESIUM: 2.6 mg/dL — AB (ref 1.7–2.4)

## 2017-08-08 MED ORDER — IOPAMIDOL (ISOVUE-300) INJECTION 61%
15.0000 mL | INTRAVENOUS | Status: AC
Start: 2017-08-08 — End: 2017-08-08
  Administered 2017-08-08 (×2): 15 mL via ORAL

## 2017-08-08 MED ORDER — SODIUM CHLORIDE 0.9 % IV SOLN
Freq: Once | INTRAVENOUS | Status: AC
  Administered 2017-08-08: 22:00:00 via INTRAVENOUS

## 2017-08-08 NOTE — Progress Notes (Signed)
eLink Physician-Brief Progress Note Patient Name: Jaclynn Guarneriomas Mago DOB: [birth year] MRN: [id number]   Date of Service  08/08/2017  HPI/Events of Note  Resp Alkalosis Low PO2  eICU Interventions  Vent rate reduced to 18, ABG in 1 hour. Fio2 increased to .40        Rosalva Neary U Lido Maske 08/08/2017, 5:28 AM

## 2017-08-08 NOTE — Progress Notes (Signed)
Pt was transported to CT and back to CCU while on the vent. °

## 2017-08-08 NOTE — Progress Notes (Signed)
Pharmacy Antibiotic Note  Cory [place] is a 57 y.o. male admitted on 08/27/2017 with bowel perforation s/p exploratory laparotomy. Patient has been receiving Zosyn. Patient initially ordered anidulafungin. After discussion with Dr. Sampson GoonFitzgerald on 4/4, patient transition from anidulafungin to fluconazole.   Plan: Continue Fluconazole 200mg  IV Q24hr.   Continue Zosyn EI 3.375g IV Q8hr.   Height: 5\' 9"  (175.3 cm) Weight: 210 lb 8.6 oz (95.5 kg) IBW/kg (Calculated) : 70.7  Temp (24hrs), Avg:100.1 F (37.8 C), Min:98.6 F (37 C), Max:100.9 F (38.3 C)  Recent Labs  Lab 08/05/17 0612  08/05/17 1916 08/05/17 2221 08/06/17 0559 08/06/17 0919 08/06/17 1240 08/07/17 0646 08/07/17 1616 08/08/17 0311  WBC  --    < > 15.0*  --  11.1*  --   --  9.6 10.4 9.6  CREATININE  --   --  3.40*  --  3.55*  --  3.72* 2.63* 2.25* 1.83*  LATICACIDVEN 4.9*  --  3.2* 2.3*  --  1.3 1.4  --   --   --    < > = values in this interval not displayed.    Estimated Creatinine Clearance: 49.5 mL/min (A) (by C-G formula based on SCr of 1.83 mg/dL (H)).    Not on File  Antimicrobials this admission: Zosyn 4/1 >>  vancomycin 4/4 >> 4/4 anidulafungin 4/4 x 1 Fluconazole 4/4 >>  Dose adjustments this admission:   Microbiology results: 4/3 BCx: no growth x 2 days  4/5 BCx (only 1 set): pending  4/2 Wound Cx: multiple organisms 4/3 UCx: no growth  4/2 MRSA PCR: negative  Thank you for allowing pharmacy to be a part of this patient's care.  Simpson,Michael L 08/08/2017 3:50 PM

## 2017-08-08 NOTE — Progress Notes (Signed)
x1 blood culture collected and MD conforti notified and d/c second culture due to difficulty getting a second sample.

## 2017-08-08 NOTE — Progress Notes (Signed)
Patient ID: Cory Graham, male   DOB: [birth year], 57 y.o.   MRN: [id number] Subjective: Patient is 3 Days Post-Op s/p exploratory laparotomy and peritoneal lavage.  Patient still on Levophed slowly lowering dose. Today with improve renal function. Continue with low fever episodes constantly.  Vital signs: Temp:  [98.7 F (37.1 C)-102.2 F (39 C)] 102 F (38.9 C) (04/04 1100) Pulse Rate:  [75-112] 81 (04/04 1100) Resp:  [11-25] 20 (04/04 1100) BP: (72-145)/(47-77) 100/60 (04/04 1100) SpO2:  [91 %-98 %] 95 % (04/04 1100) FiO2 (%):  [30 %-40 %] 30 % (04/04 0820)   Intake/Output: 04/03 0701 - 04/04 0700 In: 6540.8 [I.V.:4190.8; IV Piggyback:2350] Out: 2625 [Urine:2575; Emesis/NG output:50] Last BM Date: (unknown)  Physical Exam: Constitutional: Sedated, intubated Cardiac: regular rate and rythm Pulm: on vent, decreased breath sounds bilaterally Abdomen: soft, obese, distended, with NG tube in place.  There is significan decreased serosanguineous drainage through wound, no purulent secretions.  GU:  Foley in place, some edema  Recent Results (from the past 2160 hour(s))  CBC     Status: Abnormal   Collection Time: 08/26/2017  8:25 PM  Result Value Ref Range   WBC 24.6 (H) 3.8 - 10.6 K/uL   RBC 5.19 4.40 - 5.90 MIL/uL   Hemoglobin 12.6 (L) 13.0 - 18.0 g/dL   HCT 38.0 (L) 40.0 - 52.0 %   MCV 73.3 (L) 80.0 - 100.0 fL   MCH 24.3 (L) 26.0 - 34.0 pg   MCHC 33.1 32.0 - 36.0 g/dL   RDW 18.5 (H) 11.5 - 14.5 %   Platelets 319 150 - 440 K/uL    Comment: Performed at [facility], [address] [place], [place] [address]  Basic metabolic panel     Status: Abnormal   Collection Time: 08/27/2017  8:25 PM  Result Value Ref Range   Sodium 133 (L) 135 - 145 mmol/L   Potassium 5.2 (H) 3.5 - 5.1 mmol/L    Comment: HEMOLYSIS AT THIS LEVEL MAY AFFECT RESULT REPEATED ELECTROLYTES TFK/SRC    Chloride 91 (L) 101 - 111 mmol/L   CO2 15 (L) 22 - 32 mmol/L   Glucose, Bld 169 (H) 65 - 99  mg/dL   BUN 96 (H) 6 - 20 mg/dL    Comment: RESULT CONFIRMED BY MANUAL DILUTION TFK/SRC   Creatinine, Ser 3.42 (H) 0.61 - 1.24 mg/dL   Calcium 8.6 (L) 8.9 - 10.3 mg/dL   GFR calc non Af Amer 18 (L) >60 mL/min   GFR calc Af Amer 21 (L) >60 mL/min    Comment: (NOTE) The eGFR has been calculated using the CKD EPI equation. This calculation has not been validated in all clinical situations. eGFR's persistently <60 mL/min signify possible Chronic Kidney Disease.    Anion gap 27 (H) 5 - 15    Comment: Performed at [facility], [address] [place], [place] [address]  Lactic acid, plasma     Status: Abnormal   Collection Time: 08/06/2017  8:35 PM  Result Value Ref Range   Lactic Acid, Venous 5.7 (HH) 0.5 - 1.9 mmol/L    Comment: CRITICAL RESULT CALLED TO, READ BACK BY AND VERIFIED WITH KATE BUMGARNER AT 2129 09/02/2017.  TFK Performed at [facility], [address] [place], [place] [address]   Aerobic/Anaerobic Culture (surgical/deep wound)     Status: Abnormal (Preliminary result)   Collection Time: 08/05/17 12:17 AM  Result Value Ref Range   Specimen Description      PERITONEAL  Performed at [facility], [address] [place], [place] [address]    Special Requests      NONE Performed at [facility], [place], [place] [address]    Gram Stain      FEW WBC PRESENT, PREDOMINANTLY PMN ABUNDANT GRAM NEGATIVE RODS ABUNDANT GRAM POSITIVE COCCI MODERATE GRAM POSITIVE RODS Performed at [facility], [place] [address] [place], [place] [address]    Culture (A)     MULTIPLE ORGANISMS PRESENT, NONE PREDOMINANT NO ANAEROBES ISOLATED; CULTURE IN PROGRESS FOR 5 DAYS    Report Status PENDING   Surgical pathology     Status: None   Collection Time: 08/05/17  3:03 AM  Result Value Ref Range   SURGICAL PATHOLOGY      Surgical Pathology CASE: [id number] PATIENT: Wolfgang Yale Surgical Pathology Report     SPECIMEN  SUBMITTED: A. Omentum  CLINICAL HISTORY: None provided  PRE-OPERATIVE DIAGNOSIS: Possible gastric ulcer, possible diverticulitis with perforation  POST-OPERATIVE DIAGNOSIS: Perforated viscus     DIAGNOSIS: A. OMENTUM; REMOVAL: - ACUTE PERITONITIS WITH FOCAL EARLY FAT NECROSIS. - EXUDATE CONTAINING BACTERIAL COLONIES.  Comment: No fecal particles or bile crystals are seen. This specimen is negative for a neoplastic process.   GROSS DESCRIPTION: A. Labeled: omentum Tissue fragment(s): 6 Size: aggregate, 15.7 x 14.7 x 9.3 cm Description: in formalin, yellow to purple lobulated fibrofatty tissue with areas of adherent fibrinopurulent material  Representative submitted in 1-3 cassette(s).  Final Diagnosis performed by Bryan Lemma, MD.  Electronically signed 08/07/2017 11:05:46AM    The electronic signature indicates that the named Attending Patholo gist has evaluated the specimen  Technical component performed at [facility], [address] [place], [place] [address] Lab: [phone] Dir: Rush Farmer, MD, MMM  Professional component performed at [facility], [address], [place], [place] [address] Lab: [phone] Dir: Dellia Nims. Rubinas, MD    Glucose, capillary     Status: Abnormal   Collection Time: 08/05/17  3:52 AM  Result Value Ref Range   Glucose-Capillary 118 (H) 65 - 99 mg/dL  MRSA PCR Screening     Status: None   Collection Time: 08/05/17  4:08 AM  Result Value Ref Range   MRSA by PCR NEGATIVE NEGATIVE    Comment:        The GeneXpert MRSA Assay (FDA approved for NASAL specimens only), is one component of a comprehensive MRSA colonization surveillance program. It is not intended to diagnose MRSA infection nor to guide or monitor treatment for MRSA infections. Performed at [facility], [address] [place], [place] [address]   Blood gas, arterial     Status: Abnormal   Collection Time: 08/05/17  4:24  AM  Result Value Ref Range   FIO2 0.50    Delivery systems VENTILATOR    Mode PRESSURE REGULATED VOLUME CONTROL    VT 500 mL   LHR 15 resp/min   Peep/cpap 5.0 cm H20   pH, Arterial 7.14 (LL) 7.350 - 7.450    Comment: CRITICAL RESULT, NOTIFIED PHYSICIAN DANA BLAKENEY, NP 0505  08/05/17  BC    pCO2 arterial 56 (H) 32.0 - 48.0 mmHg   pO2, Arterial 105 83.0 - 108.0 mmHg   Bicarbonate 19.1 (L) 20.0 - 28.0 mmol/L   Acid-base deficit 9.9 (H) 0.0 - 2.0 mmol/L   O2 Saturation 96.0 %   Patient temperature 37.0    Collection site RIGHT RADIAL    Sample type ARTERIAL DRAW  Allens test (pass/fail) PASS PASS   Mechanical Rate 15     Comment: Performed at [facility], [place], [place] [address]  Procalcitonin - Baseline     Status: None   Collection Time: 08/05/17  5:16 AM  Result Value Ref Range   Procalcitonin 28.14 ng/mL    Comment:        Interpretation: PCT >= 10 ng/mL: Important systemic inflammatory response, almost exclusively due to severe bacterial sepsis or septic shock. (NOTE)       Sepsis PCT Algorithm           Lower Respiratory Tract                                      Infection PCT Algorithm    ----------------------------     ----------------------------         PCT < 0.25 ng/mL                PCT < 0.10 ng/mL         Strongly encourage             Strongly discourage   discontinuation of antibiotics    initiation of antibiotics    ----------------------------     -----------------------------       PCT 0.25 - 0.50 ng/mL            PCT 0.10 - 0.25 ng/mL               OR       >80% decrease in PCT            Discourage initiation of                                            antibiotics      Encourage discontinuation           of antibiotics    ----------------------------     -----------------------------         PCT >= 0.50 ng/mL              PCT 0.26 - 0.50 ng/mL                AND       <80% decrease in PCT             Encourage  initiation of                                             antibiotics       Encourage continuation           of antibiotics    ----------------------------     -----------------------------        PCT >= 0.50 ng/mL                  PCT > 0.50 ng/mL               AND         increase in PCT                  Strongly encourage  initiation of antibiotics    Strongly encourage escalation           of antibiotics                                     -----------------------------                                           PCT <= 0.25 ng/mL                                                 OR                                        > 80% decrease in PCT                                     Discontinue / Do not initiate                                             antibiotics Performed at [facility], [address] [place], [place] [address]   Comprehensive metabolic panel     Status: Abnormal   Collection Time: 08/05/17  5:16 AM  Result Value Ref Range   Sodium 133 (L) 135 - 145 mmol/L   Potassium 5.0 3.5 - 5.1 mmol/L   Chloride 98 (L) 101 - 111 mmol/L   CO2 18 (L) 22 - 32 mmol/L   Glucose, Bld 135 (H) 65 - 99 mg/dL   BUN 100 (H) 6 - 20 mg/dL   Creatinine, Ser 2.91 (H) 0.61 - 1.24 mg/dL   Calcium 7.2 (L) 8.9 - 10.3 mg/dL   Total Protein 5.3 (L) 6.5 - 8.1 g/dL   Albumin 1.8 (L) 3.5 - 5.0 g/dL   AST 86 (H) 15 - 41 U/L   ALT 37 17 - 63 U/L   Alkaline Phosphatase 151 (H) 38 - 126 U/L   Total Bilirubin 2.0 (H) 0.3 - 1.2 mg/dL   GFR calc non Af Amer 22 (L) >60 mL/min   GFR calc Af Amer 26 (L) >60 mL/min    Comment: (NOTE) The eGFR has been calculated using the CKD EPI equation. This calculation has not been validated in all clinical situations. eGFR's persistently <60 mL/min signify possible Chronic Kidney Disease.    Anion gap 17 (H) 5 - 15    Comment: Performed at [facility], [address] [place], [place] [address]  Troponin  I     Status: None   Collection Time: 08/05/17  5:16 AM  Result Value Ref Range   Troponin I <0.03 <0.03 ng/mL    Comment: Performed at [facility], [place], [place] [address]  Lactic acid, plasma     Status: Abnormal   Collection Time: 08/05/17  6:12 AM  Result Value Ref Range  Lactic Acid, Venous 4.9 (HH) 0.5 - 1.9 mmol/L    Comment: CRITICAL RESULT CALLED TO, READ BACK BY AND VERIFIED WITH BRENDY MANSFIELD'@0740'$  ON 08/05/17 BY HKP Performed at [facility], [address] [place], [place] [address]   Urinalysis, Complete w Microscopic     Status: Abnormal   Collection Time: 08/05/17  6:18 AM  Result Value Ref Range   Color, Urine AMBER (A) YELLOW    Comment: BIOCHEMICALS MAY BE AFFECTED BY COLOR   APPearance CLOUDY (A) CLEAR   Specific Gravity, Urine 1.015 1.005 - 1.030   pH 5.0 5.0 - 8.0   Glucose, UA NEGATIVE NEGATIVE mg/dL   Hgb urine dipstick MODERATE (A) NEGATIVE   Bilirubin Urine NEGATIVE NEGATIVE   Ketones, ur NEGATIVE NEGATIVE mg/dL   Protein, ur 30 (A) NEGATIVE mg/dL   Nitrite NEGATIVE NEGATIVE   Leukocytes, UA NEGATIVE NEGATIVE   RBC / HPF 6-30 0 - 5 RBC/hpf   WBC, UA 0-5 0 - 5 WBC/hpf   Bacteria, UA NONE SEEN NONE SEEN   Squamous Epithelial / LPF 0-5 (A) NONE SEEN   Mucus PRESENT    Hyaline Casts, UA PRESENT    Granular Casts, UA PRESENT     Comment: Performed at [facility], [address] [place], [place] [address]  Ammonia     Status: Abnormal   Collection Time: 08/05/17  7:52 AM  Result Value Ref Range   Ammonia 55 (H) 9 - 35 umol/L    Comment: Performed at [facility], [address] [place], [place] [address]  CBC with Differential/Platelet     Status: Abnormal   Collection Time: 08/05/17  7:52 AM  Result Value Ref Range   WBC 12.6 (H) 3.8 - 10.6 K/uL   RBC 4.27 (L) 4.40 - 5.90 MIL/uL   Hemoglobin 10.1 (L) 13.0 - 18.0 g/dL   HCT 32.1 (L) 40.0 - 52.0 %   MCV 75.3 (L) 80.0 - 100.0 fL   MCH  23.8 (L) 26.0 - 34.0 pg   MCHC 31.6 (L) 32.0 - 36.0 g/dL   RDW 18.4 (H) 11.5 - 14.5 %   Platelets 210 150 - 440 K/uL   Neutrophils Relative % 61 %   Lymphocytes Relative 8 %   Monocytes Relative 1 %   Eosinophils Relative 0 %   Basophils Relative 0 %   Band Neutrophils 27 %   Metamyelocytes Relative 2 %   Myelocytes 1 %   Promyelocytes Absolute 0 %   Blasts 0 %   nRBC 1 (H) 0 /100 WBC   Other 0 %   Neutro Abs 11.5 (H) 1.4 - 6.5 K/uL   Lymphs Abs 1.0 1.0 - 3.6 K/uL   Monocytes Absolute 0.1 (L) 0.2 - 1.0 K/uL   Eosinophils Absolute 0.0 0 - 0.7 K/uL   Basophils Absolute 0.0 0 - 0.1 K/uL   RBC Morphology MIXED RBC POPULATION    Smear Review      PLATELET CLUMPS NOTED ON SMEAR, COUNT APPEARS ADEQUATE    Comment: Performed at [facility], [address] [place], [place] [address]  Glucose, capillary     Status: Abnormal   Collection Time: 08/05/17  8:29 AM  Result Value Ref Range   Glucose-Capillary 117 (H) 65 - 99 mg/dL  Glucose, capillary     Status: Abnormal   Collection Time: 08/05/17 11:40 AM  Result Value Ref Range   Glucose-Capillary 106 (H) 65 - 99 mg/dL  Glucose, capillary  Status: None   Collection Time: 08/05/17  4:08 PM  Result Value Ref Range   Glucose-Capillary 93 65 - 99 mg/dL  Lactic acid, plasma     Status: Abnormal   Collection Time: 08/05/17  7:16 PM  Result Value Ref Range   Lactic Acid, Venous 3.2 (HH) 0.5 - 1.9 mmol/L    Comment: CRITICAL RESULT CALLED TO, READ BACK BY AND VERIFIED WITH AMBER COLLINS 08/05/17 2000 KLW Performed at [facility], [address] [place], [place] [address]   Basic metabolic panel     Status: Abnormal   Collection Time: 08/05/17  7:16 PM  Result Value Ref Range   Sodium 136 135 - 145 mmol/L   Potassium 5.8 (H) 3.5 - 5.1 mmol/L   Chloride 104 101 - 111 mmol/L   CO2 16 (L) 22 - 32 mmol/L   Glucose, Bld 107 (H) 65 - 99 mg/dL   BUN 100 (H) 6 - 20 mg/dL    Comment: RESULT CONFIRMED BY MANUAL DILUTION  KLW / JJB   Creatinine, Ser 3.40 (H) 0.61 - 1.24 mg/dL   Calcium 6.5 (L) 8.9 - 10.3 mg/dL   GFR calc non Af Amer 18 (L) >60 mL/min   GFR calc Af Amer 21 (L) >60 mL/min    Comment: (NOTE) The eGFR has been calculated using the CKD EPI equation. This calculation has not been validated in all clinical situations. eGFR's persistently <60 mL/min signify possible Chronic Kidney Disease.    Anion gap 16 (H) 5 - 15    Comment: Performed at [facility], [address] [place], [place] [address]  CBC with Differential/Platelet     Status: Abnormal   Collection Time: 08/05/17  7:16 PM  Result Value Ref Range   WBC 15.0 (H) 3.8 - 10.6 K/uL   RBC 3.85 (L) 4.40 - 5.90 MIL/uL   Hemoglobin 9.0 (L) 13.0 - 18.0 g/dL   HCT 29.0 (L) 40.0 - 52.0 %   MCV 75.4 (L) 80.0 - 100.0 fL   MCH 23.3 (L) 26.0 - 34.0 pg   MCHC 30.9 (L) 32.0 - 36.0 g/dL   RDW 18.4 (H) 11.5 - 14.5 %   Platelets 188 150 - 440 K/uL   Neutrophils Relative % 65 %   Lymphocytes Relative 7 %   Monocytes Relative 10 %   Eosinophils Relative 0 %   Basophils Relative 0 %   Band Neutrophils 18 %   Metamyelocytes Relative 0 %   Myelocytes 0 %   Promyelocytes Absolute 0 %   Blasts 0 %   nRBC 1 (H) 0 /100 WBC   Other 0 %   RBC Morphology MIXED RBC POPULATION    Smear Review      PLATELET CLUMPS NOTED ON SMEAR, COUNT APPEARS ADEQUATE   Neutro Abs 12.4 (H) 1.4 - 6.5 K/uL   Lymphs Abs 1.1 1.0 - 3.6 K/uL   Monocytes Absolute 1.5 (H) 0.2 - 1.0 K/uL   Eosinophils Absolute 0.0 0 - 0.7 K/uL   Basophils Absolute 0.0 0 - 0.1 K/uL    Comment: Performed at [facility], [address] [place], [place] [address]  Blood gas, arterial     Status: Abnormal   Collection Time: 08/05/17  7:18 PM  Result Value Ref Range   FIO2 0.40    Delivery systems VENTILATOR    Mode PRESSURE REGULATED VOLUME CONTROL    VT 550 mL   LHR 22 resp/min   Peep/cpap 5.0 cm H20  pH, Arterial 7.29 (L) 7.350 - 7.450   pCO2 arterial 35 32.0 -  48.0 mmHg   pO2, Arterial 75 (L) 83.0 - 108.0 mmHg   Bicarbonate 16.8 (L) 20.0 - 28.0 mmol/L   Acid-base deficit 9.0 (H) 0.0 - 2.0 mmol/L   O2 Saturation 93.0 %   Patient temperature 37.0    Collection site RIGHT RADIAL    Sample type ARTERIAL DRAW    Allens test (pass/fail) POSITIVE (A) PASS    Comment: Performed at [facility], [address] [place], [place] [address]  Glucose, capillary     Status: Abnormal   Collection Time: 08/05/17  8:28 PM  Result Value Ref Range   Glucose-Capillary 101 (H) 65 - 99 mg/dL  Lactic acid, plasma     Status: Abnormal   Collection Time: 08/05/17 10:21 PM  Result Value Ref Range   Lactic Acid, Venous 2.3 (HH) 0.5 - 1.9 mmol/L    Comment: CRITICAL RESULT CALLED TO, READ BACK BY AND VERIFIED WITH AMBER COLLIN ON 08/05/17 AT 2304 BY JAG Performed at [facility], [address] [place], [place] [address]   Glucose, capillary     Status: Abnormal   Collection Time: 08/05/17 11:45 PM  Result Value Ref Range   Glucose-Capillary 111 (H) 65 - 99 mg/dL  Glucose, capillary     Status: Abnormal   Collection Time: 08/06/17  3:41 AM  Result Value Ref Range   Glucose-Capillary 110 (H) 65 - 99 mg/dL  Blood gas, arterial     Status: Abnormal (Preliminary result)   Collection Time: 08/06/17  4:21 AM  Result Value Ref Range   FIO2 40.00    Mode PRESSURE REGULATED VOLUME CONTROL    VT 550 mL   Peep/cpap 5.0 cm H20   pH, Arterial 7.32 (L) 7.350 - 7.450   pCO2 arterial 33 32.0 - 48.0 mmHg   pO2, Arterial 94 83.0 - 108.0 mmHg   Bicarbonate 17.0 (L) 20.0 - 28.0 mmol/L   Acid-base deficit 8.3 (H) 0.0 - 2.0 mmol/L   O2 Saturation 96.6 %   Patient temperature 37.0    Collection site PENDING    Sample type ARTERIAL DRAW    Allens test (pass/fail) PASS PASS   Mechanical Rate 22     Comment: Performed at [facility], [address] [place], [place] [address]  Procalcitonin     Status: None   Collection Time: 08/06/17  5:59 AM   Result Value Ref Range   Procalcitonin 34.92 ng/mL    Comment:        Interpretation: PCT >= 10 ng/mL: Important systemic inflammatory response, almost exclusively due to severe bacterial sepsis or septic shock. (NOTE)       Sepsis PCT Algorithm           Lower Respiratory Tract                                      Infection PCT Algorithm    ----------------------------     ----------------------------         PCT < 0.25 ng/mL                PCT < 0.10 ng/mL         Strongly encourage             Strongly discourage   discontinuation of antibiotics    initiation of antibiotics    ----------------------------     -----------------------------  PCT 0.25 - 0.50 ng/mL            PCT 0.10 - 0.25 ng/mL               OR       >80% decrease in PCT            Discourage initiation of                                            antibiotics      Encourage discontinuation           of antibiotics    ----------------------------     -----------------------------         PCT >= 0.50 ng/mL              PCT 0.26 - 0.50 ng/mL                AND       <80% decrease in PCT             Encourage initiation of                                             antibiotics       Encourage continuation           of antibiotics    ----------------------------     -----------------------------        PCT >= 0.50 ng/mL                  PCT > 0.50 ng/mL               AND         increase in PCT                  Strongly encourage                                      initiation of antibiotics    Strongly encourage escalation           of antibiotics                                     -----------------------------                                           PCT <= 0.25 ng/mL                                                 OR                                        > 80% decrease in PCT  Discontinue / Do not initiate                                              antibiotics Performed at [facility], [address] [place], [place] [address]   CBC with Differential/Platelet     Status: Abnormal   Collection Time: 08/06/17  5:59 AM  Result Value Ref Range   WBC 11.1 (H) 3.8 - 10.6 K/uL   RBC 3.19 (L) 4.40 - 5.90 MIL/uL   Hemoglobin 7.5 (L) 13.0 - 18.0 g/dL   HCT 23.7 (L) 40.0 - 52.0 %   MCV 74.4 (L) 80.0 - 100.0 fL   MCH 23.4 (L) 26.0 - 34.0 pg   MCHC 31.4 (L) 32.0 - 36.0 g/dL   RDW 18.2 (H) 11.5 - 14.5 %   Platelets 155 150 - 440 K/uL   Neutrophils Relative % 35 %   Lymphocytes Relative 23 %   Monocytes Relative 12 %   Eosinophils Relative 0 %   Basophils Relative 0 %   Band Neutrophils 26 %   Metamyelocytes Relative 3 %   Myelocytes 1 %   Promyelocytes Absolute 0 %   Blasts 0 %   nRBC 0 0 /100 WBC   Other 0 %   Neutro Abs 7.2 (H) 1.4 - 6.5 K/uL   Lymphs Abs 2.6 1.0 - 3.6 K/uL   Monocytes Absolute 1.3 (H) 0.2 - 1.0 K/uL   Eosinophils Absolute 0.0 0 - 0.7 K/uL   Basophils Absolute 0.0 0 - 0.1 K/uL   Smear Review MORPHOLOGY UNREMARKABLE     Comment: PLATELET CLUMPS NOTED ON SMEAR, COUNT APPEARS ADEQUATE Performed at [facility], [address] [place], [place] [address]   Comprehensive metabolic panel     Status: Abnormal   Collection Time: 08/06/17  5:59 AM  Result Value Ref Range   Sodium 137 135 - 145 mmol/L   Potassium 5.3 (H) 3.5 - 5.1 mmol/L   Chloride 108 101 - 111 mmol/L   CO2 17 (L) 22 - 32 mmol/L   Glucose, Bld 124 (H) 65 - 99 mg/dL   BUN 111 (H) 6 - 20 mg/dL    Comment: RESULT CONFIRMED BY MANUAL DILUTION AKT   Creatinine, Ser 3.55 (H) 0.61 - 1.24 mg/dL   Calcium 6.6 (L) 8.9 - 10.3 mg/dL   Total Protein 4.6 (L) 6.5 - 8.1 g/dL   Albumin 1.7 (L) 3.5 - 5.0 g/dL   AST 55 (H) 15 - 41 U/L   ALT 29 17 - 63 U/L   Alkaline Phosphatase 138 (H) 38 - 126 U/L   Total Bilirubin 2.7 (H) 0.3 - 1.2 mg/dL   GFR calc non Af Amer 17 (L) >60 mL/min   GFR calc Af Amer 20 (L) >60 mL/min    Comment:  (NOTE) The eGFR has been calculated using the CKD EPI equation. This calculation has not been validated in all clinical situations. eGFR's persistently <60 mL/min signify possible Chronic Kidney Disease.    Anion gap 12 5 - 15    Comment: Performed at [facility], [address] [place], [place] [address]  Magnesium     Status: None   Collection Time: 08/06/17  5:59 AM  Result Value Ref Range   Magnesium 2.3 1.7 - 2.4 mg/dL    Comment: Performed at [facility], [address] [place], [place] [address]  Phosphorus     Status: Abnormal   Collection Time: 08/06/17  5:59 AM  Result Value Ref Range   Phosphorus 7.0 (H) 2.5 - 4.6 mg/dL    Comment: Performed at [facility], [address] [place], [place] [address]  Calcium, ionized     Status: Abnormal   Collection Time: 08/06/17  5:59 AM  Result Value Ref Range   Calcium, Ionized, Serum 3.8 (L) 4.5 - 5.6 mg/dL    Comment: (NOTE) Performed At: [facility] [place], [place] [id number] Rush Farmer MD [phone] Performed at [facility], [address] [place], [place] [address]   Ammonia     Status: Abnormal   Collection Time: 08/06/17  5:59 AM  Result Value Ref Range   Ammonia 68 (H) 9 - 35 umol/L    Comment: Performed at [facility], [address] [place], [place] [address]  Glucose, capillary     Status: Abnormal   Collection Time: 08/06/17  7:34 AM  Result Value Ref Range   Glucose-Capillary 107 (H) 65 - 99 mg/dL  Glucose, capillary     Status: Abnormal   Collection Time: 08/06/17  7:56 AM  Result Value Ref Range   Glucose-Capillary 108 (H) 65 - 99 mg/dL  Lactic acid, plasma     Status: None   Collection Time: 08/06/17  9:19 AM  Result Value Ref Range   Lactic Acid, Venous 1.3 0.5 - 1.9 mmol/L    Comment: Performed at [facility], [address] [place], [place] [address]  CULTURE, BLOOD (ROUTINE X 2) w Reflex to ID Panel      Status: None (Preliminary result)   Collection Time: 08/06/17 10:03 AM  Result Value Ref Range   Specimen Description BLOOD LEFT HAND    Special Requests      BOTTLES DRAWN AEROBIC AND ANAEROBIC Blood Culture results may not be optimal due to an excessive volume of blood received in culture bottles   Culture      NO GROWTH 2 DAYS Performed at [facility], [address], [place], [place] [address]    Report Status PENDING   CULTURE, BLOOD (ROUTINE X 2) w Reflex to ID Panel     Status: None (Preliminary result)   Collection Time: 08/06/17 10:03 AM  Result Value Ref Range   Specimen Description BLOOD LEFT WRIST    Special Requests      BOTTLES DRAWN AEROBIC AND ANAEROBIC Blood Culture adequate volume   Culture      NO GROWTH 2 DAYS Performed at [facility], [address] [place], [place] [address]    Report Status PENDING   Urine Culture     Status: None   Collection Time: 08/06/17 10:25 AM  Result Value Ref Range   Specimen Description      URINE, RANDOM Performed at [facility], [address], [place], [place] [address]    Special Requests      NONE Performed at [facility], [address], [place], [place] [address]    Culture      NO GROWTH Performed at [facility], [place] [address], [place], [place] [address]    Report Status 08/07/2017 FINAL   Glucose, capillary     Status: Abnormal   Collection Time: 08/06/17 11:41 AM  Result Value Ref Range   Glucose-Capillary 122 (H) 65 - 99 mg/dL  Lactic acid, plasma  Status: None   Collection Time: 08/06/17 12:40 PM  Result Value Ref Range   Lactic Acid, Venous 1.4 0.5 - 1.9 mmol/L    Comment: Performed at [facility], [address] [place], [place] [address]  Basic metabolic panel     Status: Abnormal   Collection Time: 08/06/17 12:40 PM  Result Value Ref Range   Sodium 137 135 - 145 mmol/L   Potassium 5.1 3.5 - 5.1 mmol/L   Chloride 109 101 - 111  mmol/L   CO2 17 (L) 22 - 32 mmol/L   Glucose, Bld 134 (H) 65 - 99 mg/dL   BUN 115 (H) 6 - 20 mg/dL    Comment: RESULT CONFIRMED BY MANUAL DILUTION SDR   Creatinine, Ser 3.72 (H) 0.61 - 1.24 mg/dL   Calcium 6.8 (L) 8.9 - 10.3 mg/dL   GFR calc non Af Amer 16 (L) >60 mL/min   GFR calc Af Amer 19 (L) >60 mL/min    Comment: (NOTE) The eGFR has been calculated using the CKD EPI equation. This calculation has not been validated in all clinical situations. eGFR's persistently <60 mL/min signify possible Chronic Kidney Disease.    Anion gap 11 5 - 15    Comment: Performed at [facility], [address] [place], [place] [address]  Magnesium     Status: Abnormal   Collection Time: 08/06/17 12:40 PM  Result Value Ref Range   Magnesium 2.7 (H) 1.7 - 2.4 mg/dL    Comment: Performed at [facility], [address] [place], [place] [address]  Phosphorus     Status: Abnormal   Collection Time: 08/06/17 12:40 PM  Result Value Ref Range   Phosphorus 7.4 (H) 2.5 - 4.6 mg/dL    Comment: Performed at [facility], [address] [place], [place] [address]  Calcium, ionized     Status: Abnormal   Collection Time: 08/06/17 12:40 PM  Result Value Ref Range   Calcium, Ionized, Serum 3.8 (L) 4.5 - 5.6 mg/dL    Comment: (NOTE) Performed At: [facility] [place], [place] [id number] Rush Farmer MD [id number] Performed at [facility], [address] [place], [place] [address]   Triglycerides     Status: Abnormal   Collection Time: 08/06/17 12:48 PM  Result Value Ref Range   Triglycerides 188 (H) <150 mg/dL    Comment: Performed at [facility] [address] [place], [place] [address]  Protime-INR     Status: Abnormal   Collection Time: 08/06/17  2:05 PM  Result Value Ref Range   Prothrombin Time 16.8 (H) 11.4 - 15.2 seconds   INR 1.37     Comment: Performed at [facility], [address]  [place], [place] [address]  APTT     Status: None   Collection Time: 08/06/17  2:05 PM  Result Value Ref Range   aPTT 32 24 - 36 seconds    Comment: Performed at [facility], [address] [place], [place] [address]  Glucose, capillary     Status: Abnormal   Collection Time: 08/06/17  4:25 PM  Result Value Ref Range   Glucose-Capillary 153 (H) 65 - 99 mg/dL  Glucose, capillary     Status: Abnormal   Collection Time: 08/06/17  8:00 PM  Result Value Ref Range   Glucose-Capillary 201 (H) 65 - 99 mg/dL  Glucose, capillary     Status: Abnormal   Collection Time: 08/06/17 11:55 PM  Result  Value Ref Range   Glucose-Capillary 186 (H) 65 - 99 mg/dL  Glucose, capillary     Status: Abnormal   Collection Time: 08/07/17  3:57 AM  Result Value Ref Range   Glucose-Capillary 193 (H) 65 - 99 mg/dL  Blood gas, arterial     Status: Abnormal   Collection Time: 08/07/17  4:45 AM  Result Value Ref Range   FIO2 0.40    Delivery systems VENTILATOR    Mode PRESSURE REGULATED VOLUME CONTROL    VT 550 mL   LHR 22 resp/min   Peep/cpap 5.0 cm H20   pH, Arterial 7.43 7.350 - 7.450   pCO2 arterial 38 32.0 - 48.0 mmHg   pO2, Arterial 122 (H) 83.0 - 108.0 mmHg   Bicarbonate 25.2 20.0 - 28.0 mmol/L   Acid-Base Excess 1.0 0.0 - 2.0 mmol/L   O2 Saturation 98.8 %   Patient temperature 37.0    Collection site RIGHT RADIAL    Sample type ARTERIAL DRAW    Allens test (pass/fail) PASS PASS    Comment: Performed at [facility], [address] [place], [place] [address]  Procalcitonin     Status: None   Collection Time: 08/07/17  6:46 AM  Result Value Ref Range   Procalcitonin 18.25 ng/mL    Comment:        Interpretation: PCT >= 10 ng/mL: Important systemic inflammatory response, almost exclusively due to severe bacterial sepsis or septic shock. (NOTE)       Sepsis PCT Algorithm           Lower Respiratory Tract                                      Infection PCT Algorithm     ----------------------------     ----------------------------         PCT < 0.25 ng/mL                PCT < 0.10 ng/mL         Strongly encourage             Strongly discourage   discontinuation of antibiotics    initiation of antibiotics    ----------------------------     -----------------------------       PCT 0.25 - 0.50 ng/mL            PCT 0.10 - 0.25 ng/mL               OR       >80% decrease in PCT            Discourage initiation of                                            antibiotics      Encourage discontinuation           of antibiotics    ----------------------------     -----------------------------         PCT >= 0.50 ng/mL              PCT 0.26 - 0.50 ng/mL                AND       <80% decrease in PCT  Encourage initiation of                                             antibiotics       Encourage continuation           of antibiotics    ----------------------------     -----------------------------        PCT >= 0.50 ng/mL                  PCT > 0.50 ng/mL               AND         increase in PCT                  Strongly encourage                                      initiation of antibiotics    Strongly encourage escalation           of antibiotics                                     -----------------------------                                           PCT <= 0.25 ng/mL                                                 OR                                        > 80% decrease in PCT                                     Discontinue / Do not initiate                                             antibiotics Performed at [facility], [address] [place], [place] [address]   CBC with Differential/Platelet     Status: Abnormal   Collection Time: 08/07/17  6:46 AM  Result Value Ref Range   WBC 9.6 3.8 - 10.6 K/uL   RBC 3.09 (L) 4.40 - 5.90 MIL/uL   Hemoglobin 7.2 (L) 13.0 - 18.0 g/dL   HCT 22.6 (L) 40.0 - 52.0 %   MCV 73.1 (L) 80.0 -  100.0 fL   MCH 23.3 (L) 26.0 - 34.0 pg   MCHC 31.9 (L) 32.0 - 36.0 g/dL   RDW 17.9 (H) 11.5 - 14.5 %   Platelets 137 (L) 150 - 440 K/uL   Neutrophils Relative % 84 %  Neutro Abs 8.1 (H) 1.4 - 6.5 K/uL   Lymphocytes Relative 9 %   Lymphs Abs 0.9 (L) 1.0 - 3.6 K/uL   Monocytes Relative 6 %   Monocytes Absolute 0.6 0.2 - 1.0 K/uL   Eosinophils Relative 1 %   Eosinophils Absolute 0.1 0 - 0.7 K/uL   Basophils Relative 0 %   Basophils Absolute 0.0 0 - 0.1 K/uL    Comment: Performed at [facility], [address] [place], [place] [address]  Ammonia     Status: Abnormal   Collection Time: 08/07/17  6:46 AM  Result Value Ref Range   Ammonia 42 (H) 9 - 35 umol/L    Comment: Performed at [facility], [address] [place], [place] [address]  Hepatic function panel     Status: Abnormal   Collection Time: 08/07/17  6:46 AM  Result Value Ref Range   Total Protein 5.2 (L) 6.5 - 8.1 g/dL   Albumin 1.9 (L) 3.5 - 5.0 g/dL   AST 42 (H) 15 - 41 U/L   ALT 24 17 - 63 U/L   Alkaline Phosphatase 151 (H) 38 - 126 U/L   Total Bilirubin 3.1 (H) 0.3 - 1.2 mg/dL   Bilirubin, Direct 1.9 (H) 0.1 - 0.5 mg/dL   Indirect Bilirubin 1.2 (H) 0.3 - 0.9 mg/dL    Comment: Performed at [facility], [address] [place], [place] [address]  Basic metabolic panel     Status: Abnormal   Collection Time: 08/07/17  6:46 AM  Result Value Ref Range   Sodium 142 135 - 145 mmol/L   Potassium 4.3 3.5 - 5.1 mmol/L   Chloride 107 101 - 111 mmol/L   CO2 25 22 - 32 mmol/L   Glucose, Bld 202 (H) 65 - 99 mg/dL   BUN 94 (H) 6 - 20 mg/dL   Creatinine, Ser 2.63 (H) 0.61 - 1.24 mg/dL   Calcium 6.9 (L) 8.9 - 10.3 mg/dL   GFR calc non Af Amer 25 (L) >60 mL/min   GFR calc Af Amer 29 (L) >60 mL/min    Comment: (NOTE) The eGFR has been calculated using the CKD EPI equation. This calculation has not been validated in all clinical situations. eGFR's persistently <60 mL/min signify possible  Chronic Kidney Disease.    Anion gap 10 5 - 15    Comment: Performed at [facility], [address] [place], [place] [address]  Magnesium     Status: Abnormal   Collection Time: 08/07/17  6:46 AM  Result Value Ref Range   Magnesium 2.5 (H) 1.7 - 2.4 mg/dL    Comment: Performed at [facility], [address] [place], [place] [address]  Phosphorus     Status: Abnormal   Collection Time: 08/07/17  6:46 AM  Result Value Ref Range   Phosphorus 5.0 (H) 2.5 - 4.6 mg/dL    Comment: Performed at [facility], [address] [place], [place] [address]  Calcium, ionized     Status: Abnormal   Collection Time: 08/07/17  6:46 AM  Result Value Ref Range   Calcium, Ionized, Serum 4.2 (L) 4.5 - 5.6 mg/dL    Comment: (NOTE) Performed At: [facility] [place], [place] [id number] Rush Farmer MD [id number] Performed at [facility], [address] [place], [place] [address]   Glucose, capillary     Status: Abnormal   Collection Time: 08/07/17  7:29 AM  Result Value Ref Range  Glucose-Capillary 174 (H) 65 - 99 mg/dL  Glucose, capillary     Status: Abnormal   Collection Time: 08/07/17 11:37 AM  Result Value Ref Range   Glucose-Capillary 163 (H) 65 - 99 mg/dL  Basic metabolic panel     Status: Abnormal   Collection Time: 08/07/17  4:16 PM  Result Value Ref Range   Sodium 143 135 - 145 mmol/L   Potassium 4.2 3.5 - 5.1 mmol/L   Chloride 107 101 - 111 mmol/L   CO2 28 22 - 32 mmol/L   Glucose, Bld 204 (H) 65 - 99 mg/dL   BUN 85 (H) 6 - 20 mg/dL   Creatinine, Ser 2.25 (H) 0.61 - 1.24 mg/dL   Calcium 7.2 (L) 8.9 - 10.3 mg/dL   GFR calc non Af Amer 30 (L) >60 mL/min   GFR calc Af Amer 35 (L) >60 mL/min    Comment: (NOTE) The eGFR has been calculated using the CKD EPI equation. This calculation has not been validated in all clinical situations. eGFR's persistently <60 mL/min signify possible Chronic Kidney Disease.     Anion gap 8 5 - 15    Comment: Performed at [facility], [address] [place], [place] [address]  Magnesium     Status: Abnormal   Collection Time: 08/07/17  4:16 PM  Result Value Ref Range   Magnesium 2.6 (H) 1.7 - 2.4 mg/dL    Comment: Performed at [facility], [address] [place], [place] [address]  Phosphorus     Status: None   Collection Time: 08/07/17  4:16 PM  Result Value Ref Range   Phosphorus 4.5 2.5 - 4.6 mg/dL    Comment: Performed at [facility], [address] [place], [place] [address]  CBC     Status: Abnormal   Collection Time: 08/07/17  4:16 PM  Result Value Ref Range   WBC 10.4 3.8 - 10.6 K/uL   RBC 3.06 (L) 4.40 - 5.90 MIL/uL   Hemoglobin 7.2 (L) 13.0 - 18.0 g/dL   HCT 22.6 (L) 40.0 - 52.0 %   MCV 73.8 (L) 80.0 - 100.0 fL   MCH 23.6 (L) 26.0 - 34.0 pg   MCHC 31.9 (L) 32.0 - 36.0 g/dL   RDW 17.6 (H) 11.5 - 14.5 %   Platelets 136 (L) 150 - 440 K/uL    Comment: Performed at [facility], [place], [place] [address]  Rapid HIV screen (HIV 1/2 Ab+Ag)     Status: None   Collection Time: 08/07/17  4:16 PM  Result Value Ref Range   HIV-1 P24 Antigen - HIV24 NON REACTIVE NON REACTIVE   HIV 1/2 Antibodies NON REACTIVE NON REACTIVE   Interpretation (HIV Ag Ab)      A non reactive test result means that HIV 1 or HIV 2 antibodies and HIV 1 p24 antigen were not detected in the specimen.    Comment: Performed at [facility], [address] [place], [place] [address]  Glucose, capillary     Status: Abnormal   Collection Time: 08/07/17  4:38 PM  Result Value Ref Range   Glucose-Capillary 180 (H) 65 - 99 mg/dL  Glucose, capillary     Status: Abnormal   Collection Time: 08/07/17  7:55 PM  Result Value Ref Range   Glucose-Capillary 168 (H) 65 - 99 mg/dL  Glucose, capillary     Status: Abnormal   Collection Time: 08/07/17 11:33 PM  Result Value Ref Range  Glucose-Capillary 127 (H) 65 - 99 mg/dL   Basic metabolic panel     Status: Abnormal   Collection Time: 08/08/17  3:11 AM  Result Value Ref Range   Sodium 145 135 - 145 mmol/L   Potassium 3.9 3.5 - 5.1 mmol/L   Chloride 110 101 - 111 mmol/L   CO2 28 22 - 32 mmol/L   Glucose, Bld 156 (H) 65 - 99 mg/dL   BUN 71 (H) 6 - 20 mg/dL   Creatinine, Ser 1.83 (H) 0.61 - 1.24 mg/dL   Calcium 7.2 (L) 8.9 - 10.3 mg/dL   GFR calc non Af Amer 39 (L) >60 mL/min   GFR calc Af Amer 45 (L) >60 mL/min    Comment: (NOTE) The eGFR has been calculated using the CKD EPI equation. This calculation has not been validated in all clinical situations. eGFR's persistently <60 mL/min signify possible Chronic Kidney Disease.    Anion gap 7 5 - 15    Comment: Performed at [facility], [address] [place], [place] [address]  Magnesium     Status: Abnormal   Collection Time: 08/08/17  3:11 AM  Result Value Ref Range   Magnesium 2.6 (H) 1.7 - 2.4 mg/dL    Comment: Performed at [facility], [address] [place], [place] [address]  Phosphorus     Status: None   Collection Time: 08/08/17  3:11 AM  Result Value Ref Range   Phosphorus 3.9 2.5 - 4.6 mg/dL    Comment: Performed at [facility], [address] [place], [place] [address]  CBC     Status: Abnormal   Collection Time: 08/08/17  3:11 AM  Result Value Ref Range   WBC 9.6 3.8 - 10.6 K/uL   RBC 2.95 (L) 4.40 - 5.90 MIL/uL   Hemoglobin 7.0 (L) 13.0 - 18.0 g/dL   HCT 21.7 (L) 40.0 - 52.0 %   MCV 73.8 (L) 80.0 - 100.0 fL   MCH 23.6 (L) 26.0 - 34.0 pg   MCHC 32.0 32.0 - 36.0 g/dL   RDW 17.6 (H) 11.5 - 14.5 %   Platelets 138 (L) 150 - 440 K/uL    Comment: Performed at [facility], [address] [place], [place] [address]  Hepatic function panel     Status: Abnormal   Collection Time: 08/08/17  3:11 AM  Result Value Ref Range   Total Protein 5.2 (L) 6.5 - 8.1 g/dL   Albumin 1.9 (L) 3.5 - 5.0 g/dL   AST 33 15 - 41 U/L   ALT 22 17 - 63 U/L    Alkaline Phosphatase 135 (H) 38 - 126 U/L   Total Bilirubin 2.6 (H) 0.3 - 1.2 mg/dL   Bilirubin, Direct 1.4 (H) 0.1 - 0.5 mg/dL   Indirect Bilirubin 1.2 (H) 0.3 - 0.9 mg/dL    Comment: Performed at [facility], [address] [place], [place] [address]  Ammonia     Status: Abnormal   Collection Time: 08/08/17  3:11 AM  Result Value Ref Range   Ammonia 36 (H) 9 - 35 umol/L    Comment: Performed at [facility], [address] [place],  [address]  Glucose, capillary     Status: Abnormal   Collection Time: 08/08/17  3:31 AM  Result Value Ref Range   Glucose-Capillary 134 (H) 65 - 99 mg/dL  Blood gas, arterial     Status: Abnormal   Collection Time: 08/08/17  5:00 AM  Result  Value Ref Range   FIO2 0.30    Delivery systems VENTILATOR    Mode PRESSURE REGULATED VOLUME CONTROL    VT 550 mL   LHR 22 resp/min   Peep/cpap 5.0 cm H20   pH, Arterial 7.53 (H) 7.350 - 7.450   pCO2 arterial 33 32.0 - 48.0 mmHg   pO2, Arterial 77 (L) 83.0 - 108.0 mmHg   Bicarbonate 27.6 20.0 - 28.0 mmol/L   Acid-Base Excess 5.1 (H) 0.0 - 2.0 mmol/L   O2 Saturation 96.7 %   Patient temperature 37.0    Collection site RIGHT RADIAL    Sample type ARTERIAL DRAW    Allens test (pass/fail) PASS PASS    Comment: Performed at [facility], [address] [place], [place] [address]  Blood gas, arterial     Status: Abnormal   Collection Time: 08/08/17  6:23 AM  Result Value Ref Range   FIO2 0.40    Delivery systems VENTILATOR    Mode PRESSURE REGULATED VOLUME CONTROL    VT 550 mL   LHR 18 resp/min   Peep/cpap 5.0 cm H20   pH, Arterial 7.45 7.350 - 7.450   pCO2 arterial 39 32.0 - 48.0 mmHg   pO2, Arterial 121 (H) 83.0 - 108.0 mmHg   Bicarbonate 28.4 (H) 20.0 - 28.0 mmol/L   Acid-Base Excess 4.4 (H) 0.0 - 2.0 mmol/L   O2 Saturation 98.7 %   Patient temperature 38.3    Collection site RIGHT RADIAL    Sample type ARTERIAL DRAW    Allens test (pass/fail) PASS PASS     Comment: Performed at [facility], [address] [place], [place] [address]  Glucose, capillary     Status: Abnormal   Collection Time: 08/08/17  8:00 AM  Result Value Ref Range   Glucose-Capillary 63 (L) 65 - 99 mg/dL   Comment 1 Repeat Test   Glucose, capillary     Status: Abnormal   Collection Time: 08/08/17  8:04 AM  Result Value Ref Range   Glucose-Capillary 126 (H) 65 - 99 mg/dL   Comment 1 Notify RN      Assessment/Plan: 57 yo male s/p exlap post op day 3.  -- neuro: sedated, arousable when sedation decreased -- Cardiac: still need levophed to maintain perfusion. Slowly getting better -- Pneumo: still on mechanical ventilation. Improved ABG's -- GI: still distended and has not have bowel movement. Small bowel ileus is expected. May develop abscess due to the amount of infection. Agree with CT scan with contrast for evaluation of abscess and to see if there is oral contrast extravasation.  -- Renal: improved BUM and creatinine. Stable and adequate urine output.  -- ID: continue with fever. CT scan will give information about intra abdominal infection. Agree with current Abx. Wound currently draining and improved. WBC decreasing.   Will follow up CT scan.

## 2017-08-08 NOTE — Progress Notes (Signed)
[facility]  ROUNDING NOTE   Subjective:   Tmax 102.7  UOP 3365 (2375)   Creatinine 1.83 (2.63) (3.72)  Continues on norepinephrine  1/2NS at 163m/hr  Objective:  Vital signs in last 24 hours:  Temp:  [98.6 F (37 C)-101.7 F (38.7 C)] 98.6 F (37 C) (04/05 1000) Pulse Rate:  [59-76] 59 (04/05 1000) Resp:  [17-22] 17 (04/05 1000) BP: (81-144)/(48-83) 94/65 (04/05 1000) SpO2:  [92 %-97 %] 96 % (04/05 1000) FiO2 (%):  [30 %-40 %] 40 % (04/05 0827)  Weight change:  Filed Weights   08/15/2017 2017 08/05/17 0400  Weight: 88.9 kg (196 lb) 95.5 kg (210 lb 8.6 oz)    Intake/Output: I/O last 3 completed shifts: In: 6643.2 [I.V.:5793.2; IV Piggyback:850] Out: 59735[Urine:5425; Emesis/NG output:350]   Intake/Output this shift:  Total I/O In: 500 [I.V.:400; IV Piggyback:100] Out: 500 [Urine:500]  Physical Exam: General: Critically ill   Head: ETT NGT to suction  Eyes: Anicteric, PERRL  Neck: Supple, trachea midline  Lungs:  Clear to auscultation, PRVC FiO2  40%  Heart: Regular rate and rhythm  Abdomen:  + midline incision  Extremities:  trace peripheral edema.  Neurologic: Nonfocal, moving all four extremities  Skin: No lesions  Access: RIJ temp HD catheter 4/3 Dr. SSoyla Murphy   Basic Metabolic Panel: Recent Labs  Lab 08/06/17 0559 08/06/17 1240 08/07/17 0646 08/07/17 1616 08/08/17 0311  NA 137 137 142 143 145  K 5.3* 5.1 4.3 4.2 3.9  CL 108 109 107 107 110  CO2 17* 17* _0 GLUCOSE 124* 134* 202* 204* 156*  BUN 111* 115* 94* 85* 71*  CREATININE 3.55* 3.72* 2.63* 2.25* 1.83*  CALCIUM 6.6* 6.8* 6.9* 7.2* 7.2*  MG 2.3 2.7* 2.5* 2.6* 2.6*  PHOS 7.0* 7.4* 5.0* 4.5 3.9    Liver Function Tests: Recent Labs  Lab 08/05/17 0516 08/06/17 0559 08/07/17 0646 08/08/17 0311  AST 86* 55* 42* 33  ALT 37 _1 ALKPHOS 151* 138* 151* 135*  BILITOT 2.0* 2.7* 3.1* 2.6*  PROT 5.3* 4.6* 5.2* 5.2*  ALBUMIN 1.8* 1.7* 1.9* 1.9*   No results for  input(s): LIPASE, AMYLASE in the last 168 hours. Recent Labs  Lab 08/06/17 0559 08/07/17 0646 08/08/17 0311  AMMONIA 68* 42* 36*    CBC: Recent Labs  Lab 08/05/17 0752 08/05/17 1916 08/06/17 0559 08/07/17 0646 08/07/17 1616 08/08/17 0311  WBC 12.6* 15.0* 11.1* 9.6 10.4 9.6  NEUTROABS 11.5* 12.4* 7.2* 8.1*  --   --   HGB 10.1* 9.0* 7.5* 7.2* 7.2* 7.0*  HCT 32.1* 29.0* 23.7* 22.6* 22.6* 21.7*  MCV 75.3* 75.4* 74.4* 73.1* 73.8* 73.8*  PLT 210 188 155 137* 136* 138*    Cardiac Enzymes: Recent Labs  Lab 08/05/17 0516  TROPONINI <0.03    BNP: Invalid input(s): POCBNP  CBG: Recent Labs  Lab 08/07/17 1955 08/07/17 2333 08/08/17 0331 08/08/17 0800 08/08/17 0804  GLUCAP 168* 127* 134* 64 126*    Microbiology: Results for orders placed or performed during the hospital encounter of 08/08/2017  Aerobic/Anaerobic Culture (surgical/deep wound)     Status: Abnormal (Preliminary result)   Collection Time: 08/05/17 12:17 AM  Result Value Ref Range Status   Specimen Description   Final    PERITONEAL [facility]formed at [facility] [address] [place] [address]   Special Requests   Final    NONE [facility]formed at [facility] [address] [place]  [id number]  Gram Stain   Final    FEW WBC PRESENT, PREDOMINANTLY PMN ABUNDANT GRAM NEGATIVE RODS ABUNDANT GRAM POSITIVE COCCI MODERATE GRAM POSITIVE RODS Performed at [facility], [place] [address], [place], [place] [address]    Culture (A)  Final    MULTIPLE ORGANISMS PRESENT, NONE PREDOMINANT NO ANAEROBES ISOLATED; CULTURE IN PROGRESS FOR 5 DAYS    Report Status PENDING  Incomplete  MRSA PCR Screening     Status: None   Collection Time: 08/05/17  4:08 AM  Result Value Ref Range Status   MRSA by PCR NEGATIVE NEGATIVE Final    Comment:        The GeneXpert MRSA Assay (FDA approved for NASAL specimens only), is one component of a comprehensive MRSA colonization surveillance  program. It is not intended to diagnose MRSA infection nor to guide or monitor treatment for MRSA infections. Performed at [facility], [address] [place], [place] [address]   CULTURE, BLOOD (ROUTINE X 2) w Reflex to ID Panel     Status: None (Preliminary result)   Collection Time: 08/06/17 10:03 AM  Result Value Ref Range Status   Specimen Description BLOOD LEFT HAND  Final   Special Requests   Final    BOTTLES DRAWN AEROBIC AND ANAEROBIC Blood Culture results may not be optimal due to an excessive volume of blood received in culture bottles   Culture   Final    NO GROWTH 2 DAYS Performed at [facility], [address], [place], [place] [address]    Report Status PENDING  Incomplete  CULTURE, BLOOD (ROUTINE X 2) w Reflex to ID Panel     Status: None (Preliminary result)   Collection Time: 08/06/17 10:03 AM  Result Value Ref Range Status   Specimen Description BLOOD LEFT WRIST  Final   Special Requests   Final    BOTTLES DRAWN AEROBIC AND ANAEROBIC Blood Culture adequate volume   Culture   Final    NO GROWTH 2 DAYS Performed at [facility], [address] [place], [place] [address]    Report Status PENDING  Incomplete  Urine Culture     Status: None   Collection Time: 08/06/17 10:25 AM  Result Value Ref Range Status   Specimen Description   Final    URINE, RANDOM Performed at [facility], [address], [place], [place] [address]    Special Requests   Final    NONE Performed at [facility], [address], [place], [place] [address]    Culture   Final    NO GROWTH Performed at [facility], [place] [address] [place],  [address]    Report Status 08/07/2017 FINAL  Final    Coagulation Studies: Recent Labs    08/06/17 1405  LABPROT 16.8*  INR 1.37    Urinalysis: No results for input(s): COLORURINE, LABSPEC, PHURINE, GLUCOSEU, HGBUR, BILIRUBINUR, KETONESUR, PROTEINUR, UROBILINOGEN, NITRITE,  LEUKOCYTESUR in the last 72 hours.  Invalid input(s): APPERANCEUR    Imaging: US Renal  Result Date: 08/06/2017 CLINICAL DATA:  Acute kidney failure. EXAM: RENAL / URINARY TRACT ULTRASOUND COMPLETE COMPARISON:  CT 08/11/2017 FINDINGS: Right Kidney: Length: 11.9 cm. Echogenicity within normal limits. No mass or hydronephrosis visualized. Left Kidney: Length: 12.9 cm. Echogenicity within normal limits. No mass or hydronephrosis visualized. Bladder: Foley catheter in the bladder. Ascites noted in the left upper quadrant. IMPRESSION: Negative ultrasound. Normal size kidneys. No hydronephrosis. Normal echogenicity. Electronically Signed   By: Elta Guadeloupe  Shogry M.D.   On: 08/06/2017 14:04   Dg Chest Port 1 View  Result Date: 08/08/2017 CLINICAL DATA:  Hypoxia EXAM: PORTABLE CHEST 1 VIEW COMPARISON:  August 07, 2017 FINDINGS: Endotracheal tube tip is 2.5 cm above the carina. Central catheter tip is in the right atrium. Nasogastric tube tip and side port are below the diaphragm. No pneumothorax. There is patchy atelectasis in both lung bases. Lungs elsewhere are clear. Heart is mildly enlarged with pulmonary vascularity normal. No adenopathy. No bone lesions. IMPRESSION: Tube and catheter positions as described without pneumothorax. Bibasilar atelectasis. No frank consolidation apparent. Stable cardiac prominence. Electronically Signed   By: Lowella Grip III M.D.   On: 08/08/2017 07:27   Dg Chest Port 1 View  Result Date: 08/07/2017 CLINICAL DATA:  Pneumonia EXAM: PORTABLE CHEST 1 VIEW COMPARISON:  08/06/2017 FINDINGS: Endotracheal tube in good position. Right jugular catheter tip in the right atrium. Hypoventilation with low lung volumes and bibasilar atelectasis. Progression of bibasilar atelectasis since yesterday. Small effusions. IMPRESSION: Hypoventilation with progression of bibasilar atelectasis. Electronically Signed   By: Franchot Gallo M.D.   On: 08/07/2017 07:12   Dg Chest Port 1 View  Result  Date: 08/06/2017 CLINICAL DATA:  Central line placement. EXAM: PORTABLE CHEST 1 VIEW COMPARISON:  One-view chest x-ray 08/06/2017 at 3:55 a.m. FINDINGS: The heart size is exaggerate by low lung volumes. Endotracheal tube remains 2.5 cm above the carina. NG tube terminates in the fundus of the stomach. Bilateral pleural effusions and atelectasis are noted. A new right IJ line is in place. The tip is at the cavoatrial junction. There is no pneumothorax. IMPRESSION: 1. Interval placement of right IJ line without radiographic evidence for complication. The tip is at the cavoatrial junction. 2. Persistent low lung volumes and small bilateral pleural effusions. 3. Support apparatus is otherwise stable. Electronically Signed   By: San Morelle M.D.   On: 08/06/2017 16:10     Medications:   . sodium chloride 100 mL/hr at 08/08/17 0600  . fentaNYL infusion INTRAVENOUS 300 mcg/hr (08/08/17 0600)  . fluconazole (DIFLUCAN) IV    . norepinephrine (LEVOPHED) Adult infusion 6 mcg/min (08/08/17 0602)  . piperacillin-tazobactam (ZOSYN)  IV 3.375 g (08/08/17 0935)  . propofol (DIPRIVAN) infusion 30 mcg/kg/min (08/08/17 0730)   . chlorhexidine gluconate (MEDLINE KIT)  15 mL Mouth Rinse BID  . enoxaparin (LOVENOX) injection  30 mg Subcutaneous Q24H  . iopamidol  15 mL Oral Q1 Hr x 2  . mouth rinse  15 mL Mouth Rinse 10 times per day  . pantoprazole (PROTONIX) IV  40 mg Intravenous QHS   fentaNYL, heparin, midazolam, midazolam, morphine injection  Assessment/ Plan:  Mr. Cory Graham is a 57 y.o. Hispanic male with no significant past medical history, who was admitted to [facility] on 08/07/2017 for Free intraperitoneal air [K66.8] Abdominal pain, unspecified abdominal location [R10.9]  Exploratory laparotomy on 4/1 by Dr. Windell Moment.   1. Acute renal failure 2. Hyperkalemia 3. Metabolic Acidosis 4. Hyponatremia 5. Anemia with renal failure 6. Hypoalbuminemia 7. Proteinuria  Plan: no known baseline  creatinine. Proteinuria on admission.   Suspect secondary to ATN from hypotension, surgery and infection - Continue IV fluids: 1/2NS at 169m/hr - Febrile: Empiric zosyn. Appreciate ID and Surgery input.  - Continue vasopressors - No acute indication for dialysis.    LOS: 4 Sarath Kolluru 4/5/201911:25 AM

## 2017-08-08 NOTE — Progress Notes (Addendum)
Pharmacy Electrolyte Monitoring Consult:  Pharmacy consulted to assist in monitoring and replacing electrolytes in this 57 y.o. male admitted on 08/26/2017 with bowel perforation.   Patient currently ordered 0.45NS at 17800mL/hr.   Labs:  Sodium (mmol/L)  Date Value  08/08/2017 145   Potassium (mmol/L)  Date Value  08/08/2017 3.9   Magnesium (mg/dL)  Date Value  16/10/960404/09/2017 2.6 (H)   Phosphorus (mg/dL)  Date Value  54/09/811904/09/2017 3.9   Calcium (mg/dL)  Date Value  14/78/295604/09/2017 7.2 (L)   Albumin (g/dL)  Date Value  21/30/865704/09/2017 1.9 (L)    Plan: No replacement warranted at this time. Will obtain follow up electrolytes with am labs.   Pharmacy will continue to monitor and adjust per consult.    Vyctoria Dickman L 08/08/2017 3:58 PM

## 2017-08-08 NOTE — Progress Notes (Signed)
   Name: Cory Graham MRN: [id number] DOB: February 08, 1961     CONSULTATION DATE:  REFERRING MD :   Remains on vent, Levophed + Propofol + Fentanyl  PAST MEDICAL HISTORY :   has no past medical history on file.  has a past surgical history that includes laparotomy (N/A, 08/22/2017). Prior to Admission medications   Not on File    VITAL SIGNS: Temp:  [100 F (37.8 C)-102.2 F (39 C)] 100.9 F (38.3 C) (04/05 0530) Pulse Rate:  [64-82] 67 (04/05 0600) Resp:  [17-22] 18 (04/05 0600) BP: (82-144)/(55-83) 82/58 (04/05 0600) SpO2:  [92 %-97 %] 97 % (04/05 0600) FiO2 (%):  [30 %-40 %] 40 % (04/05 0827)  Physical Examination:  Sedatedto RASS of -3. Propofol+ Fentanyl On the vent in no distress, equal air entry and no adventitious sounds S1 + S2 audible with no murmur Status post laparotomy.Midline incision, dressed, absent bowel sounds, soaking through dressing No peripheral edema  ASSESSMENT / PLAN:  Acute respiratory failure remained intubated postoperatively because of poor weaning parameters. Will discuss with surgery plans for possible reoperation versus wound VAC. Will leave intubated and sedated  Atelectasis and pneumonia. chest x-ray reveals poor excursion, air under the right hemidiaphragm postoperatively, atelectasis appreciated. Presently on vancomycin, Zosyn and Eraxis on mechanical ventilation  Hypotension with sepsis.Optimizing hemodynamics, presently on norepinephrine  AKI (improved) with sepsis, acidosis and ATN Optimize hemodynamics, avoid nephrotoxins, monitor renal function and urine out put. No obstructive uropathy on renal U/S. Dialysis cath in Rt. IJ and hasn't started on CRRT.   POD #3separation laparotomy for pneumoperitoneum perforation.Unable to identify source of perforation. Wound culture GPC, GNR and GPR. Questionable wound dehiscence with soaked dressing. Management as per surgery. Vanc + Zosyn + Eraxis. Follow with ID recommendation for  antimicrobials. Management as per surgery  Elevated liver enzymes and hyperammonemia(improved)  with sepsis Monitor LFTs, avoid hepatotoxins.  Critical care time 35 min  Tora KindredJohn Deneka Greenwalt, DO

## 2017-08-09 ENCOUNTER — Inpatient Hospital Stay: Payer: Medicaid Other

## 2017-08-09 LAB — MAGNESIUM: Magnesium: 2.3 mg/dL (ref 1.7–2.4)

## 2017-08-09 LAB — BPAM RBC
Blood Product Expiration Date: 201904302359
ISSUE DATE / TIME: 201904052158
Unit Type and Rh: 5100

## 2017-08-09 LAB — BASIC METABOLIC PANEL
Anion gap: 7 (ref 5–15)
BUN: 47 mg/dL — ABNORMAL HIGH (ref 6–20)
CALCIUM: 7.2 mg/dL — AB (ref 8.9–10.3)
CO2: 26 mmol/L (ref 22–32)
CREATININE: 1.32 mg/dL — AB (ref 0.61–1.24)
Chloride: 109 mmol/L (ref 101–111)
GFR calc Af Amer: >60 mL/min (ref >60)
GFR calc non Af Amer: 57 mL/min — ABNORMAL LOW (ref >60)
GLUCOSE: 135 mg/dL — AB (ref 65–99)
Potassium: 3.8 mmol/L (ref 3.5–5.1)
Sodium: 142 mmol/L (ref 135–145)

## 2017-08-09 LAB — TYPE AND SCREEN
ABO/RH(D): O POS
Antibody Screen: NEGATIVE
Unit division: 0

## 2017-08-09 LAB — GLUCOSE, CAPILLARY
GLUCOSE-CAPILLARY: 118 mg/dL — AB (ref 65–99)
GLUCOSE-CAPILLARY: 118 mg/dL — AB (ref 65–99)
Glucose-Capillary: 112 mg/dL — ABNORMAL HIGH (ref 65–99)
Glucose-Capillary: 118 mg/dL — ABNORMAL HIGH (ref 65–99)
Glucose-Capillary: 124 mg/dL — ABNORMAL HIGH (ref 65–99)

## 2017-08-09 LAB — CBC
HEMATOCRIT: 28 % — AB (ref 40.0–52.0)
HEMOGLOBIN: 8.8 g/dL — AB (ref 13.0–18.0)
MCH: 24.1 pg — AB (ref 26.0–34.0)
MCHC: 31.4 g/dL — ABNORMAL LOW (ref 32.0–36.0)
MCV: 76.8 fL — ABNORMAL LOW (ref 80.0–100.0)
Platelets: 173 10*3/uL (ref 150–440)
RBC: 3.64 MIL/uL — AB (ref 4.40–5.90)
RDW: 19.2 % — ABNORMAL HIGH (ref 11.5–14.5)
WBC: 13.2 10*3/uL — ABNORMAL HIGH (ref 3.8–10.6)

## 2017-08-09 LAB — PREPARE RBC (CROSSMATCH)

## 2017-08-09 LAB — TRIGLYCERIDES: Triglycerides: 248 mg/dL — ABNORMAL HIGH (ref <150)

## 2017-08-09 LAB — PHOSPHORUS: Phosphorus: 4.1 mg/dL (ref 2.5–4.6)

## 2017-08-09 MED ORDER — PIPERACILLIN SOD-TAZOBACTAM SO 2.25 (2-0.25) G IV SOLR
Freq: Three times a day (TID) | INTRAVENOUS | Status: DC
  Administered 2017-08-09 – 2017-08-14 (×14): via INTRAVENOUS
  Filled 2017-08-09 (×18): qty 4.5

## 2017-08-09 MED ORDER — FLUCONAZOLE IN SODIUM CHLORIDE 400-0.9 MG/200ML-% IV SOLN
400.0000 mg | INTRAVENOUS | Status: DC
  Administered 2017-08-09 – 2017-08-15 (×7): 400 mg via INTRAVENOUS
  Filled 2017-08-09 (×8): qty 200

## 2017-08-09 MED ORDER — PIPERACILLIN-TAZOBACTAM 4.5 G IVPB: 4.5000 g | Freq: Three times a day (TID) | INTRAVENOUS | Status: DC

## 2017-08-09 NOTE — Progress Notes (Signed)
Patient abdominal dressing has saturated through with serous yellowish fluid, reinforced dressing. Notified Maggie NP and Conforti DO, Verbal order for KUB xray given. Have called surgery, no answer on surgery number [phone]). Will reattempt to notifiy surgery before end of this shift.

## 2017-08-09 NOTE — Progress Notes (Signed)
[facility]  ROUNDING NOTE   Subjective:   Afebrile last 24 hours  UOP 2790 (3365) (2375)   Creatinine 1.32 (1.83) (2.63) (3.72)  Norepinephrine gtt  1/2NS at 125m/hr  Objective:  Vital signs in last 24 hours:  Temp:  [98.5 F (36.9 C)-99.4 F (37.4 C)] 99.4 F (37.4 C) (04/06 0900) Pulse Rate:  [57-83] 83 (04/06 0900) Resp:  [15-22] 18 (04/06 0900) BP: (75-122)/(50-93) 120/76 (04/06 0900) SpO2:  [95 %-100 %] 95 % (04/06 0900) FiO2 (%):  [40 %] 40 % (04/06 0900) Weight:  [102.3 kg (225 lb 8.5 oz)] 102.3 kg (225 lb 8.5 oz) (04/06 0000)  Weight change:  Filed Weights   08/11/2017 2017 08/05/17 0400 08/09/17 0000  Weight: 88.9 kg (196 lb) 95.5 kg (210 lb 8.6 oz) 102.3 kg (225 lb 8.5 oz)    Intake/Output: I/O last 3 completed shifts: In: 6871.1 [I.V.:5961.1; Blood:330; NG/GT:480; IV Piggyback:100] Out: 48938[Urine:3540; Emesis/NG output:650]   Intake/Output this shift:  No intake/output data recorded.  Physical Exam: General: Critically ill   Head: ETT NGT to suction  Eyes: Anicteric, PERRL  Neck: Supple, trachea midline  Lungs:  Clear to auscultation, PRVC FiO2  40%  Heart: Regular rate and rhythm  Abdomen:  + midline incision - weeping   Extremities:  + peripheral edema.  Neurologic: Intubated, sedated  Skin: No lesions  Access: RIJ temp HD catheter 4/3 Dr. SSoyla Murphy   Basic Metabolic Panel: Recent Labs  Lab 08/06/17 1240 08/07/17 0646 08/07/17 1616 08/08/17 0311 08/09/17 0216  NA 137 142 143 145 142  K 5.1 4.3 4.2 3.9 3.8  CL 109 107 107 110 109  CO2 17* '25 28 28 26  ' GLUCOSE 134* 202* 204* 156* 135*  BUN 115* 94* 85* 71* 47*  CREATININE 3.72* 2.63* 2.25* 1.83* 1.32*  CALCIUM 6.8* 6.9* 7.2* 7.2* 7.2*  MG 2.7* 2.5* 2.6* 2.6* 2.3  PHOS 7.4* 5.0* 4.5 3.9 4.1    Liver Function Tests: Recent Labs  Lab 08/05/17 0516 08/06/17 0559 08/07/17 0646 08/08/17 0311  AST 86* 55* 42* 33  ALT 37 '29 24 22  ' ALKPHOS 151* 138* 151* 135*   BILITOT 2.0* 2.7* 3.1* 2.6*  PROT 5.3* 4.6* 5.2* 5.2*  ALBUMIN 1.8* 1.7* 1.9* 1.9*   No results for input(s): LIPASE, AMYLASE in the last 168 hours. Recent Labs  Lab 08/06/17 0559 08/07/17 0646 08/08/17 0311  AMMONIA 68* 42* 36*    CBC: Recent Labs  Lab 08/05/17 0752 08/05/17 1916 08/06/17 0559 08/07/17 0646 08/07/17 1616 08/08/17 0311 08/09/17 0216  WBC 12.6* 15.0* 11.1* 9.6 10.4 9.6 13.2*  NEUTROABS 11.5* 12.4* 7.2* 8.1*  --   --   --   HGB 10.1* 9.0* 7.5* 7.2* 7.2* 7.0* 8.8*  HCT 32.1* 29.0* 23.7* 22.6* 22.6* 21.7* 28.0*  MCV 75.3* 75.4* 74.4* 73.1* 73.8* 73.8* 76.8*  PLT 210 188 155 137* 136* 138* 173    Cardiac Enzymes: Recent Labs  Lab 08/05/17 0516  TROPONINI <0.03    BNP: Invalid input(s): POCBNP  CBG: Recent Labs  Lab 08/08/17 0800 08/08/17 0804 08/08/17 1943 08/08/17 2356 08/09/17 0407  GLUCAP 63* 126* 126* 130* 112*    Microbiology: Results for orders placed or performed during the hospital encounter of 08/20/2017  Aerobic/Anaerobic Culture (surgical/deep wound)     Status: Abnormal (Preliminary result)   Collection Time: 08/05/17 12:17 AM  Result Value Ref Range Status   Specimen Description   Final    PERITONEAL Performed at [facility], [address], [place], [place] [address]    Special Requests   Final    NONE Performed at [facility], [place], [place] [address]    Gram Stain   Final    FEW WBC PRESENT, PREDOMINANTLY PMN ABUNDANT GRAM NEGATIVE RODS ABUNDANT GRAM POSITIVE COCCI MODERATE GRAM POSITIVE RODS Performed at [facility], [place] [address] [place], [place] [address]    Culture (A)  Final    MULTIPLE ORGANISMS PRESENT, NONE PREDOMINANT NO ANAEROBES ISOLATED; CULTURE IN PROGRESS FOR 5 DAYS    Report Status PENDING  Incomplete  MRSA PCR Screening     Status: None   Collection Time: 08/05/17  4:08 AM  Result Value Ref Range Status   MRSA by PCR NEGATIVE NEGATIVE Final     Comment:        The GeneXpert MRSA Assay (FDA approved for NASAL specimens only), is one component of a comprehensive MRSA colonization surveillance program. It is not intended to diagnose MRSA infection nor to guide or monitor treatment for MRSA infections. Performed at [facility], [address] [place], [place] [address]   CULTURE, BLOOD (ROUTINE X 2) w Reflex to ID Panel     Status: None (Preliminary result)   Collection Time: 08/06/17 10:03 AM  Result Value Ref Range Status   Specimen Description BLOOD LEFT HAND  Final   Special Requests   Final    BOTTLES DRAWN AEROBIC AND ANAEROBIC Blood Culture results may not be optimal due to an excessive volume of blood received in culture bottles   Culture   Final    NO GROWTH 3 DAYS Performed at [facility], [address], [place], [place] [address]    Report Status PENDING  Incomplete  CULTURE, BLOOD (ROUTINE X 2) w Reflex to ID Panel     Status: None (Preliminary result)   Collection Time: 08/06/17 10:03 AM  Result Value Ref Range Status   Specimen Description BLOOD LEFT WRIST  Final   Special Requests   Final    BOTTLES DRAWN AEROBIC AND ANAEROBIC Blood Culture adequate volume   Culture   Final    NO GROWTH 3 DAYS Performed at [facility], [address], [place], [place] [address]    Report Status PENDING  Incomplete  Urine Culture     Status: None   Collection Time: 08/06/17 10:25 AM  Result Value Ref Range Status   Specimen Description   Final    URINE, RANDOM Performed at [facility], [address], [place], [place] [address]    Special Requests   Final    NONE Performed at [facility], [address] [place], [place] [address]    Culture   Final    NO GROWTH Performed at [facility], [place] [address] [place], [place] [address]    Report Status 08/07/2017 FINAL  Final  CULTURE, BLOOD (ROUTINE X 2) w Reflex to ID Panel     Status: None (Preliminary  result)   Collection Time: 08/08/17 11:26 AM  Result Value Ref Range Status   Specimen Description BLOOD RIGHT WRIST  Final   Special Requests IN PEDIATRIC BOTTLE Blood Culture adequate volume  Final   Culture   Final    NO GROWTH < 24 HOURS Performed at [facility], [address], [place], [place] [address]    Report Status PENDING  Incomplete    Coagulation Studies: Recent Labs  08/06/17 1405  LABPROT 16.8*  INR 1.37    Urinalysis: No results for input(s): COLORURINE, LABSPEC, PHURINE, GLUCOSEU, HGBUR, BILIRUBINUR, KETONESUR, PROTEINUR, UROBILINOGEN, NITRITE, LEUKOCYTESUR in the last 72 hours.  Invalid input(s): APPERANCEUR    Imaging: Ct Abdomen Pelvis Wo Contrast  Result Date: 08/08/2017 CLINICAL DATA:  Patient is 3 Days Post-Op s/p exploratory laparotomy and peritoneal lavage. 57y.o. Hispanic male with no significant past medical history, who was admitted to [facility] on 09/02/2017 for Free intraperitoneal air K66.8Abdominal pain, unspecified abdominal location EXAM: CT ABDOMEN AND PELVIS WITHOUT CONTRAST TECHNIQUE: Multidetector CT imaging of the abdomen and pelvis was performed following the standard protocol without IV contrast. COMPARISON:  08/04/2017 FINDINGS: Lower chest: Dependent opacity is noted in both lower lobes consistent with atelectasis. There are small bilateral pleural effusions. Hepatobiliary: Liver is unremarkable. Small amount of dense material in the gallbladder neck is consistent with stones. No gallbladder wall thickening or adjacent inflammation. No bile duct dilation. Pancreas: Unremarkable. No pancreatic ductal dilatation or surrounding inflammatory changes. Spleen: Normal in size without focal abnormality. Adrenals/Urinary Tract: No adrenal masses. 16 mm mass arises from the anterior midpole the left kidney unchanged from prior exam. This has Hounsfield units of approximately 24. No other renal masses, no stones and no hydronephrosis. Normal ureters.  Bladder is mostly decompressed with a Foley catheter. Stomach/Bowel: There are air-fluid levels within the colon and small bowel consistent with a diffuse adynamic ileus. There is no evidence of bowel obstruction. No wall thickening or convincing bowel inflammation. Normal appendix is visualized. Vascular/Lymphatic: No significant vascular findings are present. No enlarged abdominal or pelvic lymph nodes. Reproductive: Unremarkable Other: There is a small amount of ascites that collects adjacent to the liver and spleen and lies between leaves of the bowel mesentery, as well as collecting in the pelvis. This is new since the prior CT. There is no free intraperitoneal air. A small amount of subcutaneous air is seen in the anterior upper abdomen. Air tracks along the anterior abdominal wall incision. Fat containing left inguinal hernia, stable from the prior exam. Musculoskeletal: Arthropathic changes noted of both hips, advanced on the right. No acute fracture. No osteoblastic or osteolytic lesions. IMPRESSION: 1. No residual free intraperitoneal air. 2. Small amount of ascites which is new since the prior CT. No evidence of an abscess. 3. Bowel air-fluid levels consistent with a postoperative adynamic ileus. No evidence of bowel obstruction or inflammation. 4. Lung base opacities consistent with atelectasis associated with small effusions. 5. 16 mm indeterminate left renal mass, which may be solid. Recommend follow-up renal MRI with and without contrast on a nonemergent basis. Electronically Signed   By: Lajean Manes M.D.   On: 08/08/2017 16:44   Dg Abd 1 View  Result Date: 08/09/2017 CLINICAL DATA:  57 y/o  M; abdominal distention. EXAM: ABDOMEN - 1 VIEW COMPARISON:  08/08/2017 CT of abdomen and pelvis. FINDINGS: Right central venous catheter tip projects over the right atrium. Enteric tube tip projects over gastric body. Stable effusions and bibasilar opacities. Mild diffuse distention of the small and large  bowel may represent ileus. Surgical staples project over the mid abdomen and pelvis. IMPRESSION: Mild diffuse distention of small and large bowel may represent ileus as seen on prior CT. Nonobstructive bowel gas pattern. Electronically Signed   By: Kristine Garbe M.D.   On: 08/09/2017 06:09   Dg Chest Port 1 View  Result Date: 08/09/2017 CLINICAL DATA:  Acute respiratory failure EXAM: PORTABLE CHEST 1 VIEW COMPARISON:  08/08/2017 FINDINGS:  Endotracheal tube in good position. Right jugular central venous catheter tip in the right atrium. Slightly improved lung volume with persistent bibasilar atelectasis. Negative for edema. Small pleural effusions IMPRESSION: Improved lung volume with persistent bibasilar atelectasis and small effusions. Electronically Signed   By: Franchot Gallo M.D.   On: 08/09/2017 07:37   Dg Chest Port 1 View  Result Date: 08/08/2017 CLINICAL DATA:  Hypoxia EXAM: PORTABLE CHEST 1 VIEW COMPARISON:  August 07, 2017 FINDINGS: Endotracheal tube tip is 2.5 cm above the carina. Central catheter tip is in the right atrium. Nasogastric tube tip and side port are below the diaphragm. No pneumothorax. There is patchy atelectasis in both lung bases. Lungs elsewhere are clear. Heart is mildly enlarged with pulmonary vascularity normal. No adenopathy. No bone lesions. IMPRESSION: Tube and catheter positions as described without pneumothorax. Bibasilar atelectasis. No frank consolidation apparent. Stable cardiac prominence. Electronically Signed   By: Lowella Grip III M.D.   On: 08/08/2017 07:27     Medications:   . sodium chloride 100 mL/hr at 08/09/17 0344  . fentaNYL infusion INTRAVENOUS 200 mcg/hr (08/09/17 0918)  . fluconazole (DIFLUCAN) IV Stopped (08/08/17 2106)  . norepinephrine (LEVOPHED) Adult infusion 5 mcg/min (08/09/17 0915)  . piperacillin-tazobactam (ZOSYN)  IV Stopped (08/09/17 0629)  . propofol (DIPRIVAN) infusion Stopped (08/09/17 0830)   . chlorhexidine  gluconate (MEDLINE KIT)  15 mL Mouth Rinse BID  . enoxaparin (LOVENOX) injection  30 mg Subcutaneous Q24H  . mouth rinse  15 mL Mouth Rinse 10 times per day  . pantoprazole (PROTONIX) IV  40 mg Intravenous QHS   fentaNYL, heparin, midazolam, midazolam, morphine injection  Assessment/ Plan:  Mr. Cory Graham is a 57 y.o. Hispanic male with no significant past medical history, who was admitted to [facility] on 08/09/2017 for Free intraperitoneal air [K66.8] Abdominal pain, unspecified abdominal location [R10.9]  Exploratory laparotomy on 4/1 by Dr. Windell Moment.   1. Acute renal failure 2. Hyperkalemia 3. Metabolic Acidosis 4. Hyponatremia 5. Anemia with renal failure 6. Hypoalbuminemia 7. Proteinuria  Plan: no known baseline creatinine. Proteinuria on admission.   Suspect secondary to ATN from hypotension, surgery and infection - Discontinue IV fluids - Empiric zosyn. Appreciate ID and Surgery input.  - Continue vasopressors: norepinephrine - No acute indication for dialysis.    LOS: 5 Rondy Krupinski 4/6/20199:25 AM

## 2017-08-09 NOTE — Progress Notes (Signed)
During bedside handoff to oncoming RN Misty, discovered that small amount of stool leaking out of wound where previously during shift had only been serous fluid. Spoke with Dr. Earlene Plateravis on the phone who stated he was on the way to assess patient.  Over shift, patient had a lot of serous fluid out of incision, reinforced as needed. Passed gas several times during shift. Was able to follow simple commands during wake up assessment. Updated wife over the phone several times during shift.

## 2017-08-09 NOTE — Progress Notes (Signed)
Pharmacy Electrolyte Monitoring Consult:  Pharmacy consulted to assist in monitoring and replacing electrolytes in this [age] y.o. male admitted on 08/19/2017 with bowel perforation.     Labs:  Sodium (mmol/L)  Date Value  08/09/2017 142   Potassium (mmol/L)  Date Value  08/09/2017 3.8   Magnesium (mg/dL)  Date Value  40/98/119104/10/2017 2.3   Phosphorus (mg/dL)  Date Value  47/82/956204/10/2017 4.1   Calcium (mg/dL)  Date Value  13/08/657804/10/2017 7.2 (L)   Albumin (g/dL)  Date Value  46/96/295204/09/2017 1.9 (L)    Plan: No electrolyte replacement warranted at this time. Will obtain follow up electrolytes with am labs.   Pharmacy will continue to monitor and adjust per consult.    Sandy Blouch A 08/09/2017 1:53 PM

## 2017-08-09 NOTE — Progress Notes (Signed)
Pharmacy Antibiotic Note  Cory Graham is a 57 y.o. male admitted on 08/17/2017 with bowel perforation s/p exploratory laparotomy. Patient has been receiving Zosyn. Patient initially ordered anidulafungin. After discussion with Dr. Sampson GoonFitzgerald on 4/4, patient transition from anidulafungin to fluconazole.   Plan: Will adjust Fluconazole to 400mg  IV Q24hr as Crcl now > 50 ml/min.  Will adjust Zosyn to  EI 4.5g IV Q8hr  Height: 5\' 9"  (175.3 cm) Weight: 225 lb 8.5 oz (102.3 kg) IBW/kg (Calculated) : 70.7  Temp (24hrs), Avg:99.2 F (37.3 C), Min:98.5 F (36.9 C), Max:100 F (37.8 C)  Recent Labs  Lab 08/05/17 0612  08/05/17 1916 08/05/17 2221 08/06/17 0559 08/06/17 0919 08/06/17 1240 08/07/17 0646 08/07/17 1616 08/08/17 0311 08/09/17 0216  WBC  --    < > 15.0*  --  11.1*  --   --  9.6 10.4 9.6 13.2*  CREATININE  --   --  3.40*  --  3.55*  --  3.72* 2.63* 2.25* 1.83* 1.32*  LATICACIDVEN 4.9*  --  3.2* 2.3*  --  1.3 1.4  --   --   --   --    < > = values in this interval not displayed.    Estimated Creatinine Clearance: 71 mL/min (A) (by C-G formula based on SCr of 1.32 mg/dL (H)).    Not on File  Antimicrobials this admission: Zosyn 4/1 >>  vancomycin 4/4 >> 4/4 anidulafungin 4/4 x 1 Fluconazole 4/4 >>  Dose adjustments this admission:   Microbiology results: 4/3 BCx: no growth x 2 days  4/5 BCx (only 1 set): pending  4/2 Wound Cx: multiple organisms 4/3 UCx: no growth  4/2 MRSA PCR: negative  Thank you for allowing pharmacy to be a part of this patient's care.  Mordecai Tindol A 08/09/2017 2:24 PM

## 2017-08-09 NOTE — Progress Notes (Signed)
[facility]ess [facility]    SYNOPSIS   Patient status post exploratory laparotomy for pneumoperitoneum without clear source of perforation. Being treated for sepsis and respiratory failure on mechanical ventilation.   ASSESSMENT/PLAN   Acute respiratory failure. no extravasation of fluid or fluid collection noted on CT scan of abdomen yesterday. No further surgical intervention planned. Will perform spontaneous awakening and breathg trial as tolerated  Atelectasis and pneumonia. This morning's chest x-ray reveals poor diaphragmatic excursion, platelike atelectasis noted in the right lower lobe above the hemidiaphram, left pleural effusion. Presently on vancomycin, Zosyn and Eraxis on mechanical ventilation  Hypotension with sepsis.Optimizing hemodynamics, presently on norepinephrine  AKI. Continues to improve, BUN is 47 over creatinine 1.32  Critical care time 35 minutes  VENTILATOR SETTINGS: Vent Mode: PRVC FiO2 (%):  [40 %] 40 % Set Rate:  [18 bmp] 18 bmp Vt Set:  [550 mL] 550 mL PEEP:  [5 cmH20] 5 cmH20 Plateau Pressure:  [20 cmH20-24 cmH20] 20 cmH20  INTAKE / OUTPUT:  Intake/Output Summary (Last 24 hours) at 08/09/2017 0804 Last data filed at 08/09/2017 0700 Gross per 24 hour  Intake 4862.89 ml  Output 2790 ml  Net 2072.89 ml     Name: Cory Graham MRN: [id number] DOB: 1961-03-21    ADMISSION DATE:  08/08/2017  SUBJECTIVE:   Pt currently on the ventilator, can not provide history or review of systems.    VITAL SIGNS: Temp:  [98.5 F (36.9 C)-99.3 F (37.4 C)] 98.9 F (37.2 C) (04/06 0400) Pulse Rate:  [57-71] 59 (04/06 0715) Resp:  [15-21] 15 (04/06 0715) BP: (75-122)/(50-93) 94/66 (04/06 0715) SpO2:  [95 %-100 %] 96 % (04/06 0715) FiO2 (%):  [40 %] 40 % (04/06 0747) Weight:  [102.3 kg (225 lb 8.5 oz)] 102.3 kg (225 lb 8.5 oz) (04/06 0000)   PHYSICAL EXAMINATION: Physical Examination:   VS: BP 94/66   Pulse (!) 59   Temp  98.9 F (37.2 C) (Rectal)   Resp 15   Ht 5\' 9"  (1.753 m)   Wt 102.3 kg (225 lb 8.5 oz)   SpO2 96%   BMI 33.31 kg/m    General Appearance:patient sedated on mechanical ventilation Neuro:Limited neurologic exam at this time HEENT: patient orally intubated, oral gastric tube in place Pulmonary:markedly diminished breath sounds bilaterally with poor diaphragmatic excursion. Respiratory crackles appreciated  CardiovascularNormal S1,S2.  No m/r/g.   Abdomen: abdominal tension dressing appreciated. Patient still saturating sheets with serous ascites Skin:   warm, no rashes, no ecchymosis  Extremities: normal, no cyanosis, clubbing.    LABORATORY PANEL:   CBC Recent Labs  Lab 08/09/17 0216  WBC 13.2*  HGB 8.8*  HCT 28.0*  PLT 173    Chemistries  Recent Labs  Lab 08/08/17 0311 08/09/17 0216  NA 145 142  K 3.9 3.8  CL 110 109  CO2 28 26  GLUCOSE 156* 135*  BUN 71* 47*  CREATININE 1.83* 1.32*  CALCIUM 7.2* 7.2*  MG 2.6* 2.3  PHOS 3.9 4.1  AST 33  --   ALT 22  --   ALKPHOS 135*  --   BILITOT 2.6*  --     Recent Labs  Lab 08/08/17 0331 08/08/17 0800 08/08/17 0804 08/08/17 1943 08/08/17 2356 08/09/17 0407  GLUCAP 134* 63* 126* 126* 130* 112*   Recent Labs  Lab 08/07/17 0445 08/08/17 0500 08/08/17 0623  PHART 7.43 7.53* 7.45  PCO2ART 38 33 39  PO2ART 122* 77* 121*   Recent Labs  Lab 08/06/17 0559 08/07/17 0646 08/08/17 0311  AST 55* 42* 33  ALT 29 24 22   ALKPHOS 138* 151* 135*  BILITOT 2.7* 3.1* 2.6*  ALBUMIN 1.7* 1.9* 1.9*    Cardiac Enzymes Recent Labs  Lab 08/05/17 0516  TROPONINI <0.03    RADIOLOGY:  Ct Abdomen Pelvis Wo Contrast  Result Date: 08/08/2017 CLINICAL DATA:  Patient is 3 Days Post-Op s/p exploratory laparotomy and peritoneal lavage. 57y.o. Hispanic male with no significant past medical history, who was admitted to [facility] on 08/20/2017 for Free intraperitoneal air K66.8Abdominal pain, unspecified abdominal location EXAM: CT  ABDOMEN AND PELVIS WITHOUT CONTRAST TECHNIQUE: Multidetector CT imaging of the abdomen and pelvis was performed following the standard protocol without IV contrast. COMPARISON:  08/18/2017 FINDINGS: Lower chest: Dependent opacity is noted in both lower lobes consistent with atelectasis. There are small bilateral pleural effusions. Hepatobiliary: Liver is unremarkable. Small amount of dense material in the gallbladder neck is consistent with stones. No gallbladder wall thickening or adjacent inflammation. No bile duct dilation. Pancreas: Unremarkable. No pancreatic ductal dilatation or surrounding inflammatory changes. Spleen: Normal in size without focal abnormality. Adrenals/Urinary Tract: No adrenal masses. 16 mm mass arises from the anterior midpole the left kidney unchanged from prior exam. This has Hounsfield units of approximately 24. No other renal masses, no stones and no hydronephrosis. Normal ureters. Bladder is mostly decompressed with a Foley catheter. Stomach/Bowel: There are air-fluid levels within the colon and small bowel consistent with a diffuse adynamic ileus. There is no evidence of bowel obstruction. No wall thickening or convincing bowel inflammation. Normal appendix is visualized. Vascular/Lymphatic: No significant vascular findings are present. No enlarged abdominal or pelvic lymph nodes. Reproductive: Unremarkable Other: There is a small amount of ascites that collects adjacent to the liver and spleen and lies between leaves of the bowel mesentery, as well as collecting in the pelvis. This is new since the prior CT. There is no free intraperitoneal air. A small amount of subcutaneous air is seen in the anterior upper abdomen. Air tracks along the anterior abdominal wall incision. Fat containing left inguinal hernia, stable from the prior exam. Musculoskeletal: Arthropathic changes noted of both hips, advanced on the right. No acute fracture. No osteoblastic or osteolytic lesions. IMPRESSION:  1. No residual free intraperitoneal air. 2. Small amount of ascites which is new since the prior CT. No evidence of an abscess. 3. Bowel air-fluid levels consistent with a postoperative adynamic ileus. No evidence of bowel obstruction or inflammation. 4. Lung base opacities consistent with atelectasis associated with small effusions. 5. 16 mm indeterminate left renal mass, which may be solid. Recommend follow-up renal MRI with and without contrast on a nonemergent basis. Electronically Signed   By: Amie Portland M.D.   On: 08/08/2017 16:44   Dg Abd 1 View  Result Date: 08/09/2017 CLINICAL DATA:  57 y/o  M; abdominal distention. EXAM: ABDOMEN - 1 VIEW COMPARISON:  08/08/2017 CT of abdomen and pelvis. FINDINGS: Right central venous catheter tip projects over the right atrium. Enteric tube tip projects over gastric body. Stable effusions and bibasilar opacities. Mild diffuse distention of the small and large bowel may represent ileus. Surgical staples project over the mid abdomen and pelvis. IMPRESSION: Mild diffuse distention of small and large bowel may represent ileus as seen on prior CT. Nonobstructive bowel gas pattern. Electronically Signed   By: Mitzi Hansen M.D.   On: 08/09/2017 06:09   Dg Chest Port 1 View  Result Date: 08/09/2017 CLINICAL DATA:  Acute  respiratory failure EXAM: PORTABLE CHEST 1 VIEW COMPARISON:  08/08/2017 FINDINGS: Endotracheal tube in good position. Right jugular central venous catheter tip in the right atrium. Slightly improved lung volume with persistent bibasilar atelectasis. Negative for edema. Small pleural effusions IMPRESSION: Improved lung volume with persistent bibasilar atelectasis and small effusions. Electronically Signed   By: Marlan Palau M.D.   On: 08/09/2017 07:37   Dg Chest Port 1 View  Result Date: 08/08/2017 CLINICAL DATA:  Hypoxia EXAM: PORTABLE CHEST 1 VIEW COMPARISON:  August 07, 2017 FINDINGS: Endotracheal tube tip is 2.5 cm above the carina.  Central catheter tip is in the right atrium. Nasogastric tube tip and side port are below the diaphragm. No pneumothorax. There is patchy atelectasis in both lung bases. Lungs elsewhere are clear. Heart is mildly enlarged with pulmonary vascularity normal. No adenopathy. No bone lesions. IMPRESSION: Tube and catheter positions as described without pneumothorax. Bibasilar atelectasis. No frank consolidation apparent. Stable cardiac prominence. Electronically Signed   By: Bretta Bang III M.D.   On: 08/08/2017 07:27      Tora Kindred, DO  08/09/2017

## 2017-08-09 NOTE — Progress Notes (Signed)
ADDENDUM: Called to bedside by patient's ICU RN, who reports patient this evening began draining liquid feces through the base of his incision. He otherwise remains intubated and sedated on 6 mcg/minute of norepineprhine with improved renal function. Patient seen and examined with same findings, discussed with patient's recent operating surgeon as well as with his wife. Will plan for return to OR tomorrow morning for re-exploration, washout, possible colon resection, definite diverting ileostomy or colostomy (if site of perforation clearly identified with no more proximal areas of any concern, and placement of large drains.  All risks, benefits, and alternatives to above procedure(s) were discussed with the patient's wife by phone, all of her questions were answered to her expressed satisfaction, patient's wife expresses she wishes for him to proceed, and informed consent was obtained verbally, documented, and witnessed/verified by patient's ICU RN.  Continue supportive ICU care overnight.  -- Scherrie Gerlach Earlene Plater, MD, RPVI [place]: [facility] - Partnering for exceptional care. Office: [phone]       SURGICAL PROGRESS NOTE  Hospital Day(s): 5.   Post op day(s): 5 Days Post-Op.   Interval History: Patient seen and examined, no acute events or new complaints overnight. Patient's renal function appears to have improved with much increased urine output and stable unchanged norepinephrine requirement at 6 mcg/minute. Patient's RN reports several episodes of patient passing +flatus.  Review of Systems: Unable to obtain due to intubation and sedation   Vital signs in last 24 hours: [min-max] current  Temp:  [98.5 F (36.9 C)-99.3 F (37.4 C)] 98.9 F (37.2 C) (04/06 0400) Pulse Rate:  [57-71] 59 (04/06 0715) Resp:  [15-21] 15 (04/06 0715) BP: (75-122)/(50-93) 94/66 (04/06 0715) SpO2:  [95 %-100 %] 96 % (04/06 0715) FiO2 (%):  [40 %] 40 % (04/06  0747) Weight:  [225 lb 8.5 oz (102.3 kg)] 225 lb 8.5 oz (102.3 kg) (04/06 0000)     Height: 5\' 9"  (175.3 cm) Weight: 225 lb 8.5 oz (102.3 kg) BMI (Calculated): 33.29   Intake/Output this shift:  No intake/output data recorded.   Intake/Output last 2 shifts:  @IOLAST2SHIFTS @   Physical Exam: Constitutional: intubated and sedated, no apparent distress  HENT: normocephalic without obvious abnormality  Eyes: PERRL, EOM's grossly intact and symmetric  Neuro: CN II - XII grossly intact and symmetric without deficit  Respiratory: intubated without labored breathing  Cardiovascular: regular rate and mild sinus tachycardia  Gastrointestinal: abdomen soft and obese, unable to assess tenderness due to sedation, abdominal incision approximated with diffuse purulent drainage primarily focused at mid and lower post-surgical abdominal wound sites where skin staples not placed and packed with gauze + ABD pads, no surrounding erythema, no drains   Labs:  CBC Latest Ref Rng & Units 08/09/2017 08/08/2017 08/07/2017  WBC 3.8 - 10.6 K/uL 13.2(H) 9.6 10.4  Hemoglobin 13.0 - 18.0 g/dL 0.9(W) 7.0(L) 7.2(L)  Hematocrit 40.0 - 52.0 % 28.0(L) 21.7(L) 22.6(L)  Platelets 150 - 440 K/uL 173 138(L) 136(L)   CMP Latest Ref Rng & Units 08/09/2017 08/08/2017 08/07/2017  Glucose 65 - 99 mg/dL 119(J) 478(G) 956(O)  BUN 6 - 20 mg/dL 13(Y) 86(V) 78(I)  Creatinine 0.61 - 1.24 mg/dL 6.96(E) 9.52(W) 4.13(K)  Sodium 135 - 145 mmol/L 142 145 143  Potassium 3.5 - 5.1 mmol/L 3.8 3.9 4.2  Chloride 101 - 111 mmol/L 109 110 107  CO2 22 - 32 mmol/L 26 28 28   Calcium 8.9 - 10.3 mg/dL 7.2(L) 7.2(L) 7.2(L)  Total Protein 6.5 - 8.1  g/dL - 5.2(L) -  Total Bilirubin 0.3 - 1.2 mg/dL - 2.6(H) -  Alkaline Phos 38 - 126 U/L - 135(H) -  AST 15 - 41 U/L - 33 -  ALT 17 - 63 U/L - 22 -    Imaging studies:  Abdominal X-ray (08/09/2017) - personally reviewed and discussed with ICU team Mild diffuse distention of small and large bowel may represent  ileus as seen on prior CT. Nonobstructive bowel gas pattern.  Chest X-ray (08/09/2017) - personally reviewed and discussed with ICU team Improved lung volume with persistent bibasilar atelectasis and small effusions.  CT Abdomen and Pelvis without contrast (08/08/2017) - personally reviewed and discussed with ICU team 1. No residual free intraperitoneal air. 2. Small amount of ascites which is new since the prior CT. No evidence of an abscess. 3. Bowel air-fluid levels consistent with a postoperative adynamic ileus. No evidence of bowel obstruction or inflammation. 4. Lung base opacities consistent with atelectasis associated with small effusions. 5. 16 mm indeterminate left renal mass, which may be solid. Recommend follow-up renal MRI with and without contrast on a nonemergent basis.   Assessment/Plan: (ICD-10's: 33K63.1) 57 y.o. male with ongoing sepsis and post-surgical ileus 5 Days Post-Op s/p exploratory laparotomy with peritoneal lavage/washout for pneumoperitoneum indicating perforated viscus and diffuse purulent fluid without focal pathology, perforation, or identified source for contamination/sepsis, complicated by AKI and by pertinent comorbidities including morbid obesity (BMI >33) a lack of known medical history due to patient not having seen a physician or similar in many years.   - monitor abdominal exam and bowel function   - broad-spectrum antibiotics and supportive ICU care  - wean ventilator support and wean vasopressors  - will continue to follow along with ICU team   All of the above findings and recommendations were discussed with the patient's ICU RN.  -- Scherrie GerlachJason E. Earlene Plateravis, MD, RPVI [place]: [facility] - Partnering for exceptional care. Office: [phone]

## 2017-08-10 ENCOUNTER — Inpatient Hospital Stay: Payer: Medicaid Other | Admitting: Anesthesiology

## 2017-08-10 ENCOUNTER — Encounter: Payer: Self-pay | Admitting: Anesthesiology

## 2017-08-10 ENCOUNTER — Encounter: Admission: EM | Disposition: E | Payer: Self-pay | Source: Home / Self Care | Attending: Surgery

## 2017-08-10 HISTORY — PX: OSTOMY: SHX5997

## 2017-08-10 HISTORY — PX: LAPAROTOMY: SHX154

## 2017-08-10 LAB — GLUCOSE, CAPILLARY
GLUCOSE-CAPILLARY: 107 mg/dL — AB (ref 65–99)
Glucose-Capillary: 104 mg/dL — ABNORMAL HIGH (ref 65–99)
Glucose-Capillary: 107 mg/dL — ABNORMAL HIGH (ref 65–99)
Glucose-Capillary: 113 mg/dL — ABNORMAL HIGH (ref 65–99)
Glucose-Capillary: 114 mg/dL — ABNORMAL HIGH (ref 65–99)
Glucose-Capillary: 121 mg/dL — ABNORMAL HIGH (ref 65–99)

## 2017-08-10 LAB — CBC
HEMATOCRIT: 26.7 % — AB (ref 40.0–52.0)
HEMOGLOBIN: 8.6 g/dL — AB (ref 13.0–18.0)
MCH: 24.8 pg — AB (ref 26.0–34.0)
MCHC: 32 g/dL (ref 32.0–36.0)
MCV: 77.6 fL — AB (ref 80.0–100.0)
PLATELETS: 265 10*3/uL (ref 150–440)
RBC: 3.45 MIL/uL — AB (ref 4.40–5.90)
RDW: 18.4 % — ABNORMAL HIGH (ref 11.5–14.5)
WBC: 10.5 10*3/uL (ref 3.8–10.6)

## 2017-08-10 LAB — COMPREHENSIVE METABOLIC PANEL
ALT: 16 U/L — AB (ref 17–63)
AST: 23 U/L (ref 15–41)
Albumin: 1.5 g/dL — ABNORMAL LOW (ref 3.5–5.0)
Alkaline Phosphatase: 116 U/L (ref 38–126)
Anion gap: 6 (ref 5–15)
BUN: 32 mg/dL — ABNORMAL HIGH (ref 6–20)
CO2: 26 mmol/L (ref 22–32)
CREATININE: 1.07 mg/dL (ref 0.61–1.24)
Calcium: 7.4 mg/dL — ABNORMAL LOW (ref 8.9–10.3)
Chloride: 112 mmol/L — ABNORMAL HIGH (ref 101–111)
GFR calc non Af Amer: >60 mL/min (ref >60)
Glucose, Bld: 125 mg/dL — ABNORMAL HIGH (ref 65–99)
POTASSIUM: 4.4 mmol/L (ref 3.5–5.1)
SODIUM: 144 mmol/L (ref 135–145)
Total Bilirubin: 2.2 mg/dL — ABNORMAL HIGH (ref 0.3–1.2)
Total Protein: 4.9 g/dL — ABNORMAL LOW (ref 6.5–8.1)

## 2017-08-10 LAB — AEROBIC/ANAEROBIC CULTURE (SURGICAL/DEEP WOUND)

## 2017-08-10 LAB — BLOOD GAS, ARTERIAL
Acid-base deficit: 8.3 mmol/L — ABNORMAL HIGH (ref 0.0–2.0)
Bicarbonate: 17 mmol/L — ABNORMAL LOW (ref 20.0–28.0)
FIO2: 40
Mechanical Rate: 22
O2 SAT: 96.6 %
PCO2 ART: 33 mmHg (ref 32.0–48.0)
PEEP/CPAP: 5 cmH2O
PO2 ART: 94 mmHg (ref 83.0–108.0)
Patient temperature: 37
VT: 550 mL
pH, Arterial: 7.32 — ABNORMAL LOW (ref 7.350–7.450)

## 2017-08-10 LAB — PHOSPHORUS: PHOSPHORUS: 4.8 mg/dL — AB (ref 2.5–4.6)

## 2017-08-10 LAB — AEROBIC/ANAEROBIC CULTURE W GRAM STAIN (SURGICAL/DEEP WOUND)

## 2017-08-10 LAB — MAGNESIUM: MAGNESIUM: 2 mg/dL (ref 1.7–2.4)

## 2017-08-10 SURGERY — LAPAROTOMY, EXPLORATORY
Anesthesia: General | Wound class: Dirty or Infected

## 2017-08-10 MED ORDER — ROCURONIUM BROMIDE 100 MG/10ML IV SOLN
INTRAVENOUS | Status: DC | PRN
  Administered 2017-08-10: 50 mg via INTRAVENOUS
  Administered 2017-08-10: 30 mg via INTRAVENOUS
  Administered 2017-08-10 (×2): 20 mg via INTRAVENOUS
  Administered 2017-08-10: 50 mg via INTRAVENOUS
  Administered 2017-08-10: 30 mg via INTRAVENOUS

## 2017-08-10 MED ORDER — BUPIVACAINE HCL (PF) 0.5 % IJ SOLN
INTRAMUSCULAR | Status: AC
  Filled 2017-08-10: qty 30

## 2017-08-10 MED ORDER — ACETAMINOPHEN 650 MG RE SUPP
650.0000 mg | Freq: Four times a day (QID) | RECTAL | Status: DC | PRN
  Administered 2017-08-10 – 2017-08-15 (×2): 650 mg via RECTAL
  Filled 2017-08-10 (×2): qty 1

## 2017-08-10 MED ORDER — LACTATED RINGERS IV SOLN
INTRAVENOUS | Status: DC | PRN
  Administered 2017-08-10 (×3): via INTRAVENOUS

## 2017-08-10 MED ORDER — SODIUM CHLORIDE 0.9 % IJ SOLN
INTRAMUSCULAR | Status: AC
  Filled 2017-08-10: qty 50

## 2017-08-10 MED ORDER — MIDAZOLAM HCL 2 MG/2ML IJ SOLN
INTRAMUSCULAR | Status: AC
  Filled 2017-08-10: qty 2

## 2017-08-10 MED ORDER — FENTANYL CITRATE (PF) 100 MCG/2ML IJ SOLN
INTRAMUSCULAR | Status: DC | PRN
  Administered 2017-08-10: 100 ug via INTRAVENOUS

## 2017-08-10 MED ORDER — CHLORHEXIDINE GLUCONATE CLOTH 2 % EX PADS
6.0000 | MEDICATED_PAD | Freq: Once | CUTANEOUS | Status: AC
  Administered 2017-08-10: 6 via TOPICAL

## 2017-08-10 MED ORDER — PHENYLEPHRINE HCL 10 MG/ML IJ SOLN
INTRAMUSCULAR | Status: DC | PRN
  Administered 2017-08-10: 100 ug via INTRAVENOUS

## 2017-08-10 MED ORDER — ROCURONIUM BROMIDE 50 MG/5ML IV SOLN
INTRAVENOUS | Status: AC
  Filled 2017-08-10: qty 1

## 2017-08-10 MED ORDER — FENTANYL CITRATE (PF) 100 MCG/2ML IJ SOLN
INTRAMUSCULAR | Status: AC
  Filled 2017-08-10: qty 2

## 2017-08-10 MED ORDER — MIDAZOLAM HCL 2 MG/2ML IJ SOLN
INTRAMUSCULAR | Status: DC | PRN
  Administered 2017-08-10: 2 mg via INTRAVENOUS

## 2017-08-10 MED ORDER — BUPIVACAINE LIPOSOME 1.3 % IJ SUSP
INTRAMUSCULAR | Status: AC
  Filled 2017-08-10: qty 20

## 2017-08-10 SURGICAL SUPPLY — 77 items
APPLIER CLIP 11 MED OPEN (CLIP)
APPLIER CLIP 13 LRG OPEN (CLIP)
BAG BILE T-TUBES STRL (MISCELLANEOUS) IMPLANT
BARRIER SKIN 2 3/4 (OSTOMY) IMPLANT
BULB RESERV EVAC DRAIN JP 100C (MISCELLANEOUS) ×4 IMPLANT
CANISTER SUCT 3000ML (MISCELLANEOUS) ×4 IMPLANT
CELL SAVER LIPIGURD (MISCELLANEOUS) IMPLANT
CHLORAPREP W/TINT 26ML (MISCELLANEOUS) IMPLANT
CLAMP POUCH DRAINAGE QUIET (OSTOMY) IMPLANT
CLIP APPLIE 11 MED OPEN (CLIP) IMPLANT
CLIP APPLIE 13 LRG OPEN (CLIP) IMPLANT
DRAIN CHANNEL JP 19F (MISCELLANEOUS) ×4 IMPLANT
DRAIN PENROSE 1/4X12 LTX (DRAIN) ×2 IMPLANT
DRAPE LAPAROTOMY 100X77 ABD (DRAPES) ×2 IMPLANT
DRSG EMULSION OIL 3X8 NADH (GAUZE/BANDAGES/DRESSINGS) ×2 IMPLANT
DRSG OPSITE POSTOP 4X10 (GAUZE/BANDAGES/DRESSINGS) IMPLANT
DRSG OPSITE POSTOP 4X8 (GAUZE/BANDAGES/DRESSINGS) IMPLANT
DRSG TEGADERM 4X4.75 (GAUZE/BANDAGES/DRESSINGS) ×2 IMPLANT
DRSG VAC ATS LRG SENSATRAC (GAUZE/BANDAGES/DRESSINGS) ×2 IMPLANT
ELECT BLADE 6 FLAT ULTRCLN (ELECTRODE) ×2 IMPLANT
ELECT REM PT RETURN 9FT ADLT (ELECTROSURGICAL) ×2
ELECTRODE REM PT RTRN 9FT ADLT (ELECTROSURGICAL) ×1 IMPLANT
EXTRT SYSTEM ALEXIS 14CM (MISCELLANEOUS)
EXTRT SYSTEM ALEXIS 17CM (MISCELLANEOUS)
GAUZE SPONGE 4X4 12PLY STRL (GAUZE/BANDAGES/DRESSINGS) ×2 IMPLANT
GLOVE BIO SURGEON STRL SZ7 (GLOVE) ×8 IMPLANT
GLOVE BIOGEL PI IND STRL 7.5 (GLOVE) ×4 IMPLANT
GLOVE BIOGEL PI INDICATOR 7.5 (GLOVE) ×4
GLOVE ECLIPSE 7.0 STRL STRAW (GLOVE) ×4 IMPLANT
GOWN STRL REUS W/TWL LRG LVL3 (GOWN DISPOSABLE) ×4 IMPLANT
HANDLE SUCTION POOLE (INSTRUMENTS) ×1 IMPLANT
HANDLE YANKAUER SUCT BULB TIP (MISCELLANEOUS) ×2 IMPLANT
KIT OSTOMY 2 PC DRNBL 2.25 STR (WOUND CARE) ×1 IMPLANT
KIT OSTOMY DRAINABLE 2.25 STR (WOUND CARE) ×1
KIT TURNOVER KIT A (KITS) ×2 IMPLANT
LIGASURE IMPACT 36 18CM CVD LR (INSTRUMENTS) IMPLANT
NEEDLE HYPO 18GX1.5 BLUNT FILL (NEEDLE) IMPLANT
NEEDLE HYPO 21X1.5 SAFETY (NEEDLE) IMPLANT
NEEDLE HYPO 22GX1.5 SAFETY (NEEDLE) IMPLANT
NS IRRIG 1000ML POUR BTL (IV SOLUTION) ×6 IMPLANT
PACK BASIN MAJOR ARMC (MISCELLANEOUS) ×2 IMPLANT
PACK COLON CLEAN CLOSURE (MISCELLANEOUS) ×2 IMPLANT
RELOAD LINEAR CUT PROX 55 BLUE (ENDOMECHANICALS) IMPLANT
RELOAD PROXIMATE 75MM BLUE (ENDOMECHANICALS) IMPLANT
RETRACTOR WND ALEXIS-O 25 LRG (MISCELLANEOUS) IMPLANT
RETRACTOR WOUND ALXS 18CM MED (MISCELLANEOUS) IMPLANT
RTRCTR WOUND ALEXIS O 18CM MED (MISCELLANEOUS)
RTRCTR WOUND ALEXIS O 25CM LRG (MISCELLANEOUS)
SCRUB POVIDONE IODINE 4 OZ (MISCELLANEOUS) ×2 IMPLANT
SPONGE DRAIN TRACH 4X4 STRL 2S (GAUZE/BANDAGES/DRESSINGS) ×2 IMPLANT
SPONGE LAP 18X18 5 PK (GAUZE/BANDAGES/DRESSINGS) ×2 IMPLANT
STAPLER GUN LINEAR PROX 60 (STAPLE) IMPLANT
STAPLER PROXIMATE 55 BLUE (STAPLE) IMPLANT
STAPLER PROXIMATE 75MM BLUE (STAPLE) IMPLANT
STAPLER SKIN PROX 35W (STAPLE) ×2 IMPLANT
SUCTION POOLE HANDLE (INSTRUMENTS) ×2
SUT BOLSTER W/RETENTN TUBE (SUTURE) ×6 IMPLANT
SUT CHROMIC 0 SH (SUTURE) IMPLANT
SUT CHROMIC 2 0 SH (SUTURE) IMPLANT
SUT ETHILON NAB BLK LR #2 30IN (SUTURE) ×8 IMPLANT
SUT PDS #1 CTX NDL (SUTURE) ×2 IMPLANT
SUT PDS AB 0 CT1 27 (SUTURE) ×4 IMPLANT
SUT PDS AB 1 TP1 96 (SUTURE) ×2 IMPLANT
SUT SILK 2 0 (SUTURE) ×1
SUT SILK 2 0 SH (SUTURE) ×6 IMPLANT
SUT SILK 2-0 18XBRD TIE 12 (SUTURE) ×1 IMPLANT
SUT SILK 3-0 (SUTURE) ×3 IMPLANT
SUT SILK 3-0 SH-1 18XCR BRD (SUTURE) ×1
SUT VIC AB 3-0 SH 27 (SUTURE) ×2
SUT VIC AB 3-0 SH 27X BRD (SUTURE) ×2 IMPLANT
SUTURE SILK 3-0 SH-1 18XCR BRD (SUTURE) ×1 IMPLANT
SYR 20CC LL (SYRINGE) ×2 IMPLANT
SYSTEM CONTND EXTRCTN KII BLLN (MISCELLANEOUS) IMPLANT
TOWEL OR 17X26 4PK STRL BLUE (TOWEL DISPOSABLE) IMPLANT
TRAY FOLEY CATH SILVER 16FR LF (SET/KITS/TRAYS/PACK) IMPLANT
TRAY FOLEY W/METER SILVER 16FR (SET/KITS/TRAYS/PACK) IMPLANT
WND VAC CANISTER 500ML (MISCELLANEOUS) ×2 IMPLANT

## 2017-08-10 NOTE — Anesthesia Preprocedure Evaluation (Addendum)
Anesthesia Evaluation  Patient identified by MRN, date of birth, ID band Patient unresponsive    Reviewed: Allergy & Precautions, H&P , NPO status , Patient's Chart, lab work & pertinent test results  History of Anesthesia Complications Negative for: history of anesthetic complications  Airway Mallampati: Intubated  TM Distance: <3 FB Neck ROM: full    Dental  (+) Chipped, Poor Dentition   Pulmonary neg shortness of breath, former smoker,           Cardiovascular Exercise Tolerance: Good (-) Past MI negative cardio ROS       Neuro/Psych negative neurological ROS  negative psych ROS   GI/Hepatic negative GI ROS, Neg liver ROS,   Endo/Other  negative endocrine ROS  Renal/GU      Musculoskeletal   Abdominal   Peds  Hematology negative hematology ROS (+)   Anesthesia Other Findings History reviewed. No pertinent past medical history.  Past Surgical History: 08/31/2017: LAPAROTOMY; N/A     Comment:  Procedure: EXPLORATORY LAPAROTOMY, removal of abdomen,               peritoneal lavage, umbilical hernia repair;  Surgeon:               Carolan Shiverintron-Diaz, Edgardo, MD;  Location: [facility] ORS;  Service:              General;  Laterality: N/A;  BMI    Body Mass Index:  33.83 kg/m      Reproductive/Obstetrics negative OB ROS                             Anesthesia Physical Anesthesia Plan  ASA: IV  Anesthesia Plan: General ETT   Post-op Pain Management:    Induction: Intravenous  PONV Risk Score and Plan:   Airway Management Planned: Oral ETT  Additional Equipment:   Intra-op Plan:   Post-operative Plan: Post-operative intubation/ventilation  Informed Consent: I have reviewed the patients History and Physical, chart, labs and discussed the procedure including the risks, benefits and alternatives for the proposed anesthesia with the patient or authorized representative who has indicated  his/her understanding and acceptance.   Dental Advisory Given  Plan Discussed with: Anesthesiologist, CRNA and Surgeon  Anesthesia Plan Comments: (Wife consented over the phone [phone]  Wife consented for risks of anesthesia including but not limited to:  - adverse reactions to medications - damage to teeth, lips or other oral mucosa - sore throat or hoarseness - Damage to heart, brain, lungs or loss of life  Wife voiced understanding.)       Anesthesia Quick Evaluation

## 2017-08-10 NOTE — Progress Notes (Signed)
Pt to OR in stable condition, wife at bedside. Triple lumen catheter in place, Fentenal at 400 mcgs, Diprovan at 5 mcgs  And Lopressor at 5 mcgs. BP with MAP of 65 , HR 80-90,s temp continues to be elevated at 99.5. abdo wound continues to drain +++ loose watery stool. Report to OR RN

## 2017-08-10 NOTE — Transfer of Care (Signed)
Immediate Anesthesia Transfer of Care Note  Patient: Cory Graham  Procedure(s) Performed: EXPLORATORY LAPAROTOMY,re-exploration of recent laparotomy,abdominal wound washout, creation ileostomy (N/A ) OSTOMY (N/A )  Patient Location: ICU  Anesthesia Type:General  Level of Consciousness: sedated and Patient remains intubated per anesthesia plan  Airway & Oxygen Therapy: Patient remains intubated per anesthesia plan and Patient placed on Ventilator (see vital sign flow sheet for setting)  Post-op Assessment: Report given to RN and Post -op Vital signs reviewed and stable  Post vital signs: stable  Last Vitals:  Vitals Value Taken Time  BP 94/63 08/31/2017  4:45 PM  Temp    Pulse 95 08/12/2017  4:45 PM  Resp 20 09/02/2017  4:45 PM  SpO2 93 % 08/27/2017  4:45 PM    Last Pain:  Vitals:   08/31/2017 0645  TempSrc: Rectal  PainSc:          Complications: No apparent anesthesia complications

## 2017-08-10 NOTE — Op Note (Signed)
SURGICAL OPERATIVE REPORT  DATE OF PROCEDURE: 08/25/2017  ATTENDING Surgeon(s): Vickie Epley, MD  ANESTHESIA: general   PRE-OPERATIVE DIAGNOSIS: Unidentified colorectal perforation 5 days s/p exploratory laparotomy (icd-10's: K63.1)  POST-OPERATIVE DIAGNOSIS: Unidentified colorectal perforation 5 days s/p exploratory laparotomy (icd-10's: K63.1)  PROCEDURE(S):  1.) Re-exploration of recent laparotomy (cpt: 49002) 2.) Abdominal washout with 4 Liters warm saline 3.) Creation of diverting loop ileostomy (cpt: 44310) 4.) Placement of B/L pelvic 19 F Blake drains x2 5.) Closure of abdominal wound with bolstered external retention sutures x4 6.) Placement of negative pressure wound vac over loosely staple-closed skin (cpt: 97606)  INTRAOPERATIVE FINDINGS: Copious loose feces draining from base of recent exploratory laparotomy wound, peritoneal fecal staining focused primarily at lower abdomen/pelvis, extensive recent post-surgical adhesions, edematous bowel with contracted mesentery requiring peritoneal scoring for ileostomy to traverse patient's obese abdominal wall  INTRAVENOUS FLUIDS: 2000 mL crystalloid   ESTIMATED BLOOD LOSS: 100 mL   URINE OUTPUT: 400 mL   SPECIMENS: No Specimen  IMPLANTS: No implants  DRAINS: B/L 37F Blake pelvic/lower abdominal drains x2 to closed bulb suction  COMPLICATIONS: None apparent  CONDITION AT END OF PROCEDURE: Remaining intubated and sedated on 4 mcg/minute norepinephrine, same as at beginning of surgery  DISPOSITION OF PATIENT: Returned to ICU  INDICATIONS FOR PROCEDURE:  57 year-old Male presented 5 Days ago to [facility] ED for severe abdominal pain x 3 days with acute altered mental status and was found to have pneumoperitoneum, for which patient emergently underwent exploratory laparotomy with findings only of diffuse 2 Liters purulent peritonitis without any identifiable source, after which patient's abdomen was closed, and patient was  transferred intubated to ICU, requiring vasopressors with AKI nearly requiring HD. Patient's renal function improved and vasopressor requirement slowly decreased until POD#5 patient began pouring copious foul-smelling loose feces from the lower 1/3 of his recent laparotomy wound. Accordingly, all risks, benefits, and alternatives to re-exploration with fecal diversion, washout, and drainage were discussed with patient's wife, all of patient's wife's questions were answered to her expressed satisfaction, and informed verbal consent was obtained, documented, and witnessed.  DETAILS OF PROCEDURE: Patient was brought already intubated to the operating suite and appropriately identified. General anesthesia remained ongoing with appropriate ongoing therapeutic antibiotics. In supine position, operative site was prepped and draped in the usual sterile fashion, and following a brief time out, lower 2/3 of patient's recent skin staples were removed, copious liquid feces was suctioned to permit visualization, and PDS sutures used 5 days ago to re-approximate patient's fascia was cut just above the knot tied halfway along patient's incision, and a hemostat was placed at the tip of the upper incision end of PDS suture while the lower half+ of PDS was removed from the patient's wound, opening fascia along the way.   Blunt gloved finger-fracture was used to gently disrupt extensive diffuse glue-like adhesions throughout the patient's abdominal cavity and small intestine in particular, which was found to be thickened, edematous, and mildly dilated. A brief attempt was made to better visualize patient's sigmoid colon, but it was quickly apparent that adequate exposure and evaluation for site of leak not previously identified was not going to be possible/safe, but fecal staining was noted primarily in the patient's pelvis and lower-most abdomen, consistent with sigmoid colonic source of fecal contamination. 4 Liters of copious  warm saline irrigation was instilled and suctioned from each quadrant of patient's exposed abdominal cavity until clear. Decision was made at this time to bring out a diverting loop ileostomy,  and fortunately ileocecal junction was able to be identified, and small intestine just proximal to this was further freed from surrounding interloop and posterior peritoneal/mesenteric adhesions and assessed for its ability to reach the patient's anterior abdominal wall. Ileostomy site was selected, balancing distance away from midline wound and feasibility considering limited mesenteric length. Skin incision was made using scalpel and was extended to fascia using blunt dissection, after which fascia was opened in direction of its fibers using electrocautery (perpendicular to skin incision), and muscle fibers were spread apart.  In order to traverse patient's quite thick obese abdominal wall despite contracted inflamed mesentery, mesenteric peritoneum was lifted off underlying vessels using hemostat and scored on both sides of the mesentary, parallel to intestine and retroperitoneum using electrocautery, taking care to avoid impairment of underlying mesenteric vessels. Although still under some tension, this permitted extraction of the terminal ileum through the created ileostomy wound while selected loop of terminal ileum remained clearly viable. A small opening on the mesenteric side of selected loop of small bowel was opened using hemostats between any mesenteric vessels, through which supporting rod was advanced, holding the planned ileostomy in place. At this time, attention was redirected to evaluation of patient's fascia, which was found to be somewhat necrotic, particularly where previously bathed in fecal material. Necrosis was debrided as able. B/L large 19 F pelvic and lower abdominal Blake drains were placed and secured using 2-0 silk suture. Hemostasis and viability of ileostomy were once more assessed, after  which a single looped PDS was used to re-approximate fascia with relatively deep bites of tissue to compensate for tissue quality and with 2 infraumbilical and 2 supraumbilical transfascial transcutaneous bolstered 2-0 nylon external retention sutures placed along the way and tied after passing each with the running PDS suture until the new running fascial PDS suture met the previously cut end of PDS with hemostat attached with enough length to permit tying of these two ends without difficulty or without any impairment or compromise to the integrity of either suture. Wound was further irrigated, dried, and widely spaced surgical skin staples were used to very loosely re-approximate skin.  Perforated petroleum gauze was applied to the undersurface of cut VAC sponge, creating a Prevena-like dressing over the approximated wound at high risk for infection and associated wound complications to both evacuate any contamination as well as to protect it from potential soilage from nearby ileostomy. Anterior abdominal wall skin was thoroughly cleaned and dried, and benzoin was applied to the skin surrounding the midline wound to promote adhesion and seal. Adhesive film was applied, in which circular opening was created, at which tubing was connected and placed to suction, and appropriate seal was confirmed. Enterotomy was then made in antimesenteric side of selected loop of terminal ileum using electrocautery and matured using 3-0 Vicryl sutures to dermis. Ostomy appliance was then cut to appropriate size and applied, after which drain sponge dressings were applied.  Patient was then safely able to be returned to ICU intubated and sedated on same 4 mcg/minute norepinephrine support for post-operative monitoring and care.  I was present for all aspects of the above procedure, and no operative complications were apparent.

## 2017-08-10 NOTE — Anesthesia Postprocedure Evaluation (Signed)
Anesthesia Post Note  Patient: Cory Graham  Procedure(s) Performed: EXPLORATORY LAPAROTOMY,re-exploration of recent laparotomy,abdominal wound washout, creation ileostomy (N/A ) OSTOMY (N/A )  Patient location during evaluation: SICU Anesthesia Type: General Level of consciousness: sedated Pain management: pain level controlled Vital Signs Assessment: post-procedure vital signs reviewed and stable Respiratory status: patient remains intubated per anesthesia plan Cardiovascular status: stable Postop Assessment: no apparent nausea or vomiting Anesthetic complications: no     Last Vitals:  Vitals:   10-19-2017 2100 10-19-2017 2200  BP: 106/69 101/69  Pulse:  90  Resp: 19 20  Temp:    SpO2:  100%    Last Pain:  Vitals:   10-19-2017 2000  TempSrc: Rectal  PainSc:                  Cleda MccreedyJoseph K Simranjit Thayer

## 2017-08-10 NOTE — Progress Notes (Signed)
Pharmacy Antibiotic Note  Cory Graham is a 57 y.o. male admitted on 08/08/2017 with bowel perforation s/p exploratory laparotomy. Patient has been receiving Zosyn. Patient initially ordered anidulafungin. After discussion with Dr. Sampson GoonFitzgerald on 4/4, patient transition from anidulafungin to fluconazole.   Plan: Will adjust Fluconazole to 400mg  IV Q24hr as Crcl now > 50 ml/min.  Will adjust Zosyn to  EI 4.5g IV Q8hr  4/7:  Patient back for surgery for wash-out. Scr improving. WBC improving. Temp 101 this am.   Height: 5\' 9"  (175.3 cm) Weight: 229 lb 0.9 oz (103.9 kg) IBW/kg (Calculated) : 70.7  Temp (24hrs), Avg:100.1 F (37.8 C), Min:99.2 F (37.3 C), Max:101 F (38.3 C)  Recent Labs  Lab 08/05/17 0612  08/05/17 1916 08/05/17 2221  08/06/17 0919 08/06/17 1240 08/07/17 0646 08/07/17 1616 08/08/17 0311 08/09/17 0216 11-15-17 0451  WBC  --    < > 15.0*  --    < >  --   --  9.6 10.4 9.6 13.2* 10.5  CREATININE  --   --  3.40*  --    < >  --  3.72* 2.63* 2.25* 1.83* 1.32* 1.07  LATICACIDVEN 4.9*  --  3.2* 2.3*  --  1.3 1.4  --   --   --   --   --    < > = values in this interval not displayed.    Estimated Creatinine Clearance: 88.3 mL/min (by C-G formula based on SCr of 1.07 mg/dL).    No Known Allergies  Antimicrobials this admission: Zosyn 4/1 >>  vancomycin 4/4 >> 4/4 anidulafungin 4/4 x 1 Fluconazole 4/4 >>  Dose adjustments this admission:   Microbiology results: 4/3 BCx: no growth x 2 days  4/5 BCx (only 1 set): pending  4/2 Wound Cx: multiple organisms 4/3 UCx: no growth  4/2 MRSA PCR: negative  Thank you for allowing pharmacy to be a part of this patient's care.  Chasitee Zenker A 08/19/2017 11:38 AM

## 2017-08-10 NOTE — Progress Notes (Signed)
Cory Graham  ROUNDING NOTE   Subjective:   Tmax 101  UOP 1895 (2790) (3365) (2375)   Creatinine 1.07 (1.32) (1.83) (2.63) (3.72)  Norepinephrine gtt  Off IV fluids  Objective:  Vital signs in last 24 hours:  Temp:  [99.2 F (37.3 C)-101 F (38.3 C)] 99.5 F (37.5 C) (04/07 0645) Pulse Rate:  [64-92] 75 (04/07 0615) Resp:  [10-22] 18 (04/07 0615) BP: (83-129)/(51-90) 93/56 (04/07 0615) SpO2:  [83 %-100 %] 97 % (04/07 0615) FiO2 (%):  [40 %] 40 % (04/07 0800) Weight:  [103.9 kg (229 lb 0.9 oz)] 103.9 kg (229 lb 0.9 oz) (04/06 2149)  Weight change: 1.6 kg (3 lb 8.4 oz) Filed Weights   08/05/17 0400 08/09/17 0000 08/09/17 2149  Weight: 95.5 kg (210 lb 8.6 oz) 102.3 kg (225 lb 8.5 oz) 103.9 kg (229 lb 0.9 oz)    Intake/Output: I/O last 3 completed shifts: In: 9417 [I.V.:3806; Blood:330] Out: 3135 [Urine:2660; Emesis/NG output:475]   Intake/Output this shift:  No intake/output data recorded.  Physical Exam: General: Critically ill   Head: ETT NGT to suction  Eyes: Anicteric, PERRL  Neck: Supple, trachea midline  Lungs:  Clear to auscultation, PRVC FiO2  40%  Heart: Regular rate and rhythm  Abdomen:  + midline incision - weeping   Extremities:  + peripheral edema.  Neurologic: Intubated, sedated  Skin: No lesions  Access: RIJ temp HD catheter 4/3 Dr. Soyla Murphy    Basic Metabolic Panel: Recent Labs  Lab 08/07/17 0646 08/07/17 1616 08/08/17 0311 08/09/17 0216 08/27/2017 0451  NA 142 143 145 142 144  K 4.3 4.2 3.9 3.8 4.4  CL 107 107 110 109 112*  CO2 '25 28 28 26 26  ' GLUCOSE 202* 204* 156* 135* 125*  BUN 94* 85* 71* 47* 32*  CREATININE 2.63* 2.25* 1.83* 1.32* 1.07  CALCIUM 6.9* 7.2* 7.2* 7.2* 7.4*  MG 2.5* 2.6* 2.6* 2.3 2.0  PHOS 5.0* 4.5 3.9 4.1 4.8*    Liver Function Tests: Recent Labs  Lab 08/05/17 0516 08/06/17 0559 08/07/17 0646 08/08/17 0311 08/28/2017 0451  AST 86* 55* 42* 33 23  ALT 37 '29 24 22 ' 16*  ALKPHOS 151* 138* 151* 135*  116  BILITOT 2.0* 2.7* 3.1* 2.6* 2.2*  PROT 5.3* 4.6* 5.2* 5.2* 4.9*  ALBUMIN 1.8* 1.7* 1.9* 1.9* 1.5*   No results for input(s): LIPASE, AMYLASE in the last 168 hours. Recent Labs  Lab 08/06/17 0559 08/07/17 0646 08/08/17 0311  AMMONIA 68* 42* 36*    CBC: Recent Labs  Lab 08/05/17 0752 08/05/17 1916 08/06/17 0559 08/07/17 0646 08/07/17 1616 08/08/17 0311 08/09/17 0216 08/15/2017 0451  WBC 12.6* 15.0* 11.1* 9.6 10.4 9.6 13.2* 10.5  NEUTROABS 11.5* 12.4* 7.2* 8.1*  --   --   --   --   HGB 10.1* 9.0* 7.5* 7.2* 7.2* 7.0* 8.8* 8.6*  HCT 32.1* 29.0* 23.7* 22.6* 22.6* 21.7* 28.0* 26.7*  MCV 75.3* 75.4* 74.4* 73.1* 73.8* 73.8* 76.8* 77.6*  PLT 210 188 155 137* 136* 138* 173 265    Cardiac Enzymes: Recent Labs  Lab 08/05/17 0516  TROPONINI <0.03    BNP: Invalid input(s): POCBNP  CBG: Recent Labs  Lab 08/09/17 1541 08/09/17 1948 08/08/2017 0036 09/01/2017 0425 09/02/2017 0742  GLUCAP 118* 118* 113* 104* 121*    Microbiology: Results for orders placed or performed during the hospital encounter of 09/01/2017  Aerobic/Anaerobic Culture (surgical/deep wound)     Status: Abnormal (Preliminary result)   Collection Time: 08/05/17 12:17  AM  Result Value Ref Range Status   Specimen Description   Final    PERITONEAL Performed at [facility], [address] [place], [place] [address]    Special Requests   Final    NONE Performed at [facility], [place], [place] [address]    Gram Stain   Final    FEW WBC PRESENT, PREDOMINANTLY PMN ABUNDANT GRAM NEGATIVE RODS ABUNDANT GRAM POSITIVE COCCI MODERATE GRAM POSITIVE RODS Performed at [facility], [place] [address], [place], [place] [address]    Culture (A)  Final    MULTIPLE ORGANISMS PRESENT, NONE PREDOMINANT NO ANAEROBES ISOLATED; CULTURE IN PROGRESS FOR 5 DAYS    Report Status PENDING  Incomplete  MRSA PCR Screening     Status: None   Collection Time: 08/05/17  4:08 AM  Result  Value Ref Range Status   MRSA by PCR NEGATIVE NEGATIVE Final    Comment:        The GeneXpert MRSA Assay (FDA approved for NASAL specimens only), is one component of a comprehensive MRSA colonization surveillance program. It is not intended to diagnose MRSA infection nor to guide or monitor treatment for MRSA infections. Performed at [facility], [address] [place], [place] [address]   CULTURE, BLOOD (ROUTINE X 2) w Reflex to ID Panel     Status: None (Preliminary result)   Collection Time: 08/06/17 10:03 AM  Result Value Ref Range Status   Specimen Description BLOOD LEFT HAND  Final   Special Requests   Final    BOTTLES DRAWN AEROBIC AND ANAEROBIC Blood Culture results may not be optimal due to an excessive volume of blood received in culture bottles   Culture   Final    NO GROWTH 4 DAYS Performed at [facility], [address] [place], [place] [address]    Report Status PENDING  Incomplete  CULTURE, BLOOD (ROUTINE X 2) w Reflex to ID Panel     Status: None (Preliminary result)   Collection Time: 08/06/17 10:03 AM  Result Value Ref Range Status   Specimen Description BLOOD LEFT WRIST  Final   Special Requests   Final    BOTTLES DRAWN AEROBIC AND ANAEROBIC Blood Culture adequate volume   Culture   Final    NO GROWTH 4 DAYS Performed at [facility], [address] [place], [place] [address]    Report Status PENDING  Incomplete  Urine Culture     Status: None   Collection Time: 08/06/17 10:25 AM  Result Value Ref Range Status   Specimen Description   Final    URINE, RANDOM Performed at [facility], [address], [place], [place] [address]    Special Requests   Final    NONE Performed at [facility], [address], [place], [place] [address]    Culture   Final    NO GROWTH Performed at [facility], [place] [address] [place], [place] [address]    Report Status 08/07/2017 FINAL  Final  CULTURE, BLOOD  (ROUTINE X 2) w Reflex to ID Panel     Status: None (Preliminary result)   Collection Time: 08/08/17 11:26 AM  Result Value Ref Range Status   Specimen Description BLOOD RIGHT WRIST  Final   Special Requests IN PEDIATRIC BOTTLE Blood Culture adequate volume  Final   Culture   Final    NO GROWTH 2 DAYS Performed at [facility], [place]  [address] [place], [place] [address]    Report Status PENDING  Incomplete  Culture, respiratory (NON-Expectorated)     Status: None (Preliminary result)   Collection Time: 08/13/2017  5:35 AM  Result Value Ref Range Status   Specimen Description   Final    TRACHEAL ASPIRATE Performed at [facility], [address] [place], [place] [address]    Special Requests   Final    NONE Performed at [facility], [address] [place], [place] [address]    Gram Stain   Final    RARE WBC PRESENT, PREDOMINANTLY PMN NO SQUAMOUS EPITHELIAL CELLS SEEN NO ORGANISMS SEEN Performed at [facility], [place] [address], [place], [place] [address]    Culture PENDING  Incomplete   Report Status PENDING  Incomplete    Coagulation Studies: No results for input(s): LABPROT, INR in the last 72 hours.  Urinalysis: No results for input(s): COLORURINE, LABSPEC, PHURINE, GLUCOSEU, HGBUR, BILIRUBINUR, KETONESUR, PROTEINUR, UROBILINOGEN, NITRITE, LEUKOCYTESUR in the last 72 hours.  Invalid input(s): APPERANCEUR    Imaging: Ct Abdomen Pelvis Wo Contrast  Result Date: 08/08/2017 CLINICAL DATA:  Patient is 3 Days Post-Op s/p exploratory laparotomy and peritoneal lavage. 57y.o. Hispanic male with no significant past medical history, who was admitted to [facility] on 08/28/2017 for Free intraperitoneal air K66.8Abdominal pain, unspecified abdominal location EXAM: CT ABDOMEN AND PELVIS WITHOUT CONTRAST TECHNIQUE: Multidetector CT imaging of the abdomen and pelvis was performed following the standard protocol without IV contrast. COMPARISON:  09/01/2017  FINDINGS: Lower chest: Dependent opacity is noted in both lower lobes consistent with atelectasis. There are small bilateral pleural effusions. Hepatobiliary: Liver is unremarkable. Small amount of dense material in the gallbladder neck is consistent with stones. No gallbladder wall thickening or adjacent inflammation. No bile duct dilation. Pancreas: Unremarkable. No pancreatic ductal dilatation or surrounding inflammatory changes. Spleen: Normal in size without focal abnormality. Adrenals/Urinary Tract: No adrenal masses. 16 mm mass arises from the anterior midpole the left Graham unchanged from prior exam. This has Hounsfield units of approximately 24. No other renal masses, no stones and no hydronephrosis. Normal ureters. Bladder is mostly decompressed with a Foley catheter. Stomach/Bowel: There are air-fluid levels within the colon and small bowel consistent with a diffuse adynamic ileus. There is no evidence of bowel obstruction. No wall thickening or convincing bowel inflammation. Normal appendix is visualized. Vascular/Lymphatic: No significant vascular findings are present. No enlarged abdominal or pelvic lymph nodes. Reproductive: Unremarkable Other: There is a small amount of ascites that collects adjacent to the liver and spleen and lies between leaves of the bowel mesentery, as well as collecting in the pelvis. This is new since the prior CT. There is no free intraperitoneal air. A small amount of subcutaneous air is seen in the anterior upper abdomen. Air tracks along the anterior abdominal wall incision. Fat containing left inguinal hernia, stable from the prior exam. Musculoskeletal: Arthropathic changes noted of both hips, advanced on the right. No acute fracture. No osteoblastic or osteolytic lesions. IMPRESSION: 1. No residual free intraperitoneal air. 2. Small amount of ascites which is new since the prior CT. No evidence of an abscess. 3. Bowel air-fluid levels consistent with a postoperative  adynamic ileus. No evidence of bowel obstruction or inflammation. 4. Lung base opacities consistent with atelectasis associated with small effusions. 5. 16 mm indeterminate left renal mass, which may be solid. Recommend follow-up renal MRI with and without contrast on a nonemergent basis. Electronically Signed   By: Shanon Brow  Ormond M.D.   On: 08/08/2017 16:44   Dg Abd 1 View  Result Date: 08/09/2017 CLINICAL DATA:  57 y/o  M; abdominal distention. EXAM: ABDOMEN - 1 VIEW COMPARISON:  08/08/2017 CT of abdomen and pelvis. FINDINGS: Right central venous catheter tip projects over the right atrium. Enteric tube tip projects over gastric body. Stable effusions and bibasilar opacities. Mild diffuse distention of the small and large bowel may represent ileus. Surgical staples project over the mid abdomen and pelvis. IMPRESSION: Mild diffuse distention of small and large bowel may represent ileus as seen on prior CT. Nonobstructive bowel gas pattern. Electronically Signed   By: Kristine Garbe M.D.   On: 08/09/2017 06:09   Dg Chest Port 1 View  Result Date: 08/09/2017 CLINICAL DATA:  Acute respiratory failure EXAM: PORTABLE CHEST 1 VIEW COMPARISON:  08/08/2017 FINDINGS: Endotracheal tube in good position. Right jugular central venous catheter tip in the right atrium. Slightly improved lung volume with persistent bibasilar atelectasis. Negative for edema. Small pleural effusions IMPRESSION: Improved lung volume with persistent bibasilar atelectasis and small effusions. Electronically Signed   By: Franchot Gallo M.D.   On: 08/09/2017 07:37     Medications:   . fentaNYL infusion INTRAVENOUS 400 mcg/hr (08/25/2017 0848)  . fluconazole (DIFLUCAN) IV Stopped (08/09/17 1951)  . norepinephrine (LEVOPHED) Adult infusion 4 mcg/min (08/12/2017 0831)  . small volume/piggyback builder Stopped (08/12/2017 0522)  . propofol (DIPRIVAN) infusion 5 mcg/kg/min (08/05/2017 0831)   . chlorhexidine gluconate (MEDLINE KIT)  15 mL  Mouth Rinse BID  . enoxaparin (LOVENOX) injection  30 mg Subcutaneous Q24H  . mouth rinse  15 mL Mouth Rinse 10 times per day  . pantoprazole (PROTONIX) IV  40 mg Intravenous QHS   acetaminophen, fentaNYL, heparin, midazolam, midazolam, morphine injection  Assessment/ Plan:  Cory Graham is a 57 y.o. Hispanic male with no significant past medical history, who was admitted to [facility] on 08/29/2017 for Free intraperitoneal air [K66.8] Abdominal pain, unspecified abdominal location [R10.9]  Exploratory laparotomy on 4/1 by Dr. Windell Moment.   1. Acute renal failure 2. Hyperkalemia 3. Metabolic Acidosis 4. Hyponatremia 5. Anemia with renal failure 6. Hypoalbuminemia 7. Proteinuria  Plan: no known baseline creatinine. Proteinuria on admission.   Suspect secondary to ATN from hypotension, surgery and infection - Discontinued IV fluids - Empiric zosyn. Appreciate ID and Surgery input. Surgery scheduled again for today, 4/7 - Continue vasopressors: norepinephrine - Creatinine and Graham function improved.   Will sign off. Please call if needed   LOS: 6 Cory Graham 4/7/201910:03 AM

## 2017-08-10 NOTE — Progress Notes (Signed)
Pharmacy Electrolyte Monitoring Consult:  Pharmacy consulted to assist in monitoring and replacing electrolytes in this 57 y.o. male admitted on 08/18/2017 with bowel perforation.     Labs:  Sodium (mmol/L)  Date Value  2017-09-05 144   Potassium (mmol/L)  Date Value  2017-09-05 4.4   Magnesium (mg/dL)  Date Value  16/10/96042019-05-03 2.0   Phosphorus (mg/dL)  Date Value  54/09/81192019-05-03 4.8 (H)   Calcium (mg/dL)  Date Value  14/78/29562019-05-03 7.4 (L)   Albumin (g/dL)  Date Value  21/30/86572019-05-03 1.5 (L)    Plan: No electrolyte replacement warranted at this time. Will obtain follow up electrolytes with am labs. Nephrology has signed off.  Pharmacy will continue to monitor and adjust per consult.    Raegen Tarpley A 08/13/2017 11:34 AM

## 2017-08-10 NOTE — Progress Notes (Signed)
Patient returned from the OR sedated with rocurium and fentyl pushes. VSS HR 90s, BP MAP of 80, continues low grade fever 100.2 , replaced on cooling blanket. 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 Has 2 drains , one each side of abdo, both draining serious drainage, with 30 and 70 mls drained. Foley catheter in place. Wife in to see , all questions answered as asked

## 2017-08-10 NOTE — Progress Notes (Signed)
[facility] Critical Care Medicine Progess Note    SYNOPSIS   Patient status post exploratory laparotomy for pneumoperitoneum without clear source of perforation. Being treated for sepsis and respiratory failure on mechanical ventilation.   ASSESSMENT/PLAN   Acute respiratory failure. patient is being taken back to the operating room for exploratory laparotomy and washout this morning. Will continue on full ventilator support  Atelectasis and pneumonia. Presently on vancomycin, Zosyn and Eraxis on mechanical ventilation  Hypotension with sepsis.Optimizing hemodynamics, presently on norepinephrine  AKI. Continues to improve, BUN is 32/1.07  Critical care time 35 minutes  VENTILATOR SETTINGS: Vent Mode: PRVC FiO2 (%):  [40 %] 40 % Set Rate:  [18 bmp] 18 bmp Vt Set:  [550 mL] 550 mL PEEP:  [5 cmH20] 5 cmH20 Plateau Pressure:  [18 cmH20-29 cmH20] 20 cmH20  INTAKE / OUTPUT:  Intake/Output Summary (Last 24 hours) at 08/09/2017 0821 Last data filed at 09/01/2017 0700 Gross per 24 hour  Intake 990.18 ml  Output 1970 ml  Net -979.82 ml     Name: Cory Graham MRN: [id number] DOB: [birth year]    ADMISSION DATE:  08/18/2017  SUBJECTIVE:   Pt currently on the ventilator, can not provide history or review of systems. Over the last 24 hours, purulent feculent drainage saturating abdominal dressings and bandages. Appreciate surgery's assistance. Being taken back to the operating room for exploratory laparotomy and washout this morning.   VITAL SIGNS: Temp:  [99.2 F (37.3 C)-101 F (38.3 C)] 99.5 F (37.5 C) (04/07 0645) Pulse Rate:  [61-118] 75 (04/07 0615) Resp:  [10-22] 18 (04/07 0615) BP: (83-136)/(51-94) 93/56 (04/07 0615) SpO2:  [77 %-100 %] 97 % (04/07 0615) FiO2 (%):  [40 %] 40 % (04/07 0800) Weight:  [103.9 kg (229 lb 0.9 oz)] 103.9 kg (229 lb 0.9 oz) (04/06 2149)   PHYSICAL EXAMINATION: Physical Examination:   VS: BP (!) 93/56   Pulse 75   Temp 99.5 F  (37.5 C) (Rectal)   Resp 18   Ht 5\' 9"  (1.753 m)   Wt 103.9 kg (229 lb 0.9 oz)   SpO2 97%   BMI 33.83 kg/m    General Appearance:patient sedated on mechanical ventilation Neuro:Limited neurologic exam at this time HEENT: patient orally intubated, oral gastric tube in place Pulmonary:markedly diminished breath sounds bilaterally with poor diaphragmatic excursion. Respiratory crackles appreciated  CardiovascularNormal S1,S2.  No m/r/g.   Abdomen: abdominal tension dressing appreciated. Dressing bandage saturated with purulent feculent material which is foul-smelling Skin:   warm, no rashes, no ecchymosis  Extremities: normal, no cyanosis, clubbing.    LABORATORY PANEL:   CBC Recent Labs  Lab 08/24/2017 0451  WBC 10.5  HGB 8.6*  HCT 26.7*  PLT 265    Chemistries  Recent Labs  Lab 08/31/2017 0451  NA 144  K 4.4  CL 112*  CO2 26  GLUCOSE 125*  BUN 32*  CREATININE 1.07  CALCIUM 7.4*  MG 2.0  PHOS 4.8*  AST 23  ALT 16*  ALKPHOS 116  BILITOT 2.2*    Recent Labs  Lab 08/09/17 1106 08/09/17 1541 08/09/17 1948 08/19/2017 0036 08/28/2017 0425 08/11/2017 0742  GLUCAP 124* 118* 118* 113* 104* 121*   Recent Labs  Lab 08/07/17 0445 08/08/17 0500 08/08/17 0623  PHART 7.43 7.53* 7.45  PCO2ART 38 33 39  PO2ART 122* 77* 121*   Recent Labs  Lab 08/07/17 0646 08/08/17 0311 08/12/2017 0451  AST 42* 33 23  ALT 24 22 16*  ALKPHOS 151* 135* 116  BILITOT 3.1* 2.6* 2.2*  ALBUMIN 1.9* 1.9* 1.5*    Cardiac Enzymes Recent Labs  Lab 08/05/17 0516  TROPONINI <0.03    RADIOLOGY:  Ct Abdomen Pelvis Wo Contrast  Result Date: 08/08/2017 CLINICAL DATA:  Patient is 3 Days Post-Op s/p exploratory laparotomy and peritoneal lavage. 57y.o. Hispanic male with no significant past medical history, who was admitted to [facility] on 08/27/2017 for Free intraperitoneal air K66.8Abdominal pain, unspecified abdominal location EXAM: CT ABDOMEN AND PELVIS WITHOUT CONTRAST TECHNIQUE: Multidetector  CT imaging of the abdomen and pelvis was performed following the standard protocol without IV contrast. COMPARISON:  09/02/2017 FINDINGS: Lower chest: Dependent opacity is noted in both lower lobes consistent with atelectasis. There are small bilateral pleural effusions. Hepatobiliary: Liver is unremarkable. Small amount of dense material in the gallbladder neck is consistent with stones. No gallbladder wall thickening or adjacent inflammation. No bile duct dilation. Pancreas: Unremarkable. No pancreatic ductal dilatation or surrounding inflammatory changes. Spleen: Normal in size without focal abnormality. Adrenals/Urinary Tract: No adrenal masses. 16 mm mass arises from the anterior midpole the left kidney unchanged from prior exam. This has Hounsfield units of approximately 24. No other renal masses, no stones and no hydronephrosis. Normal ureters. Bladder is mostly decompressed with a Foley catheter. Stomach/Bowel: There are air-fluid levels within the colon and small bowel consistent with a diffuse adynamic ileus. There is no evidence of bowel obstruction. No wall thickening or convincing bowel inflammation. Normal appendix is visualized. Vascular/Lymphatic: No significant vascular findings are present. No enlarged abdominal or pelvic lymph nodes. Reproductive: Unremarkable Other: There is a small amount of ascites that collects adjacent to the liver and spleen and lies between leaves of the bowel mesentery, as well as collecting in the pelvis. This is new since the prior CT. There is no free intraperitoneal air. A small amount of subcutaneous air is seen in the anterior upper abdomen. Air tracks along the anterior abdominal wall incision. Fat containing left inguinal hernia, stable from the prior exam. Musculoskeletal: Arthropathic changes noted of both hips, advanced on the right. No acute fracture. No osteoblastic or osteolytic lesions. IMPRESSION: 1. No residual free intraperitoneal air. 2. Small amount of  ascites which is new since the prior CT. No evidence of an abscess. 3. Bowel air-fluid levels consistent with a postoperative adynamic ileus. No evidence of bowel obstruction or inflammation. 4. Lung base opacities consistent with atelectasis associated with small effusions. 5. 16 mm indeterminate left renal mass, which may be solid. Recommend follow-up renal MRI with and without contrast on a nonemergent basis. Electronically Signed   By: Amie Portland M.D.   On: 08/08/2017 16:44   Dg Abd 1 View  Result Date: 08/09/2017 CLINICAL DATA:  57 y/o  M; abdominal distention. EXAM: ABDOMEN - 1 VIEW COMPARISON:  08/08/2017 CT of abdomen and pelvis. FINDINGS: Right central venous catheter tip projects over the right atrium. Enteric tube tip projects over gastric body. Stable effusions and bibasilar opacities. Mild diffuse distention of the small and large bowel may represent ileus. Surgical staples project over the mid abdomen and pelvis. IMPRESSION: Mild diffuse distention of small and large bowel may represent ileus as seen on prior CT. Nonobstructive bowel gas pattern. Electronically Signed   By: Mitzi Hansen M.D.   On: 08/09/2017 06:09   Dg Chest Port 1 View  Result Date: 08/09/2017 CLINICAL DATA:  Acute respiratory failure EXAM: PORTABLE CHEST 1 VIEW COMPARISON:  08/08/2017 FINDINGS: Endotracheal tube in good position. Right jugular central venous catheter tip in  the right atrium. Slightly improved lung volume with persistent bibasilar atelectasis. Negative for edema. Small pleural effusions IMPRESSION: Improved lung volume with persistent bibasilar atelectasis and small effusions. Electronically Signed   By: Marlan Palauharles  Clark M.D.   On: 08/09/2017 07:37    Tora KindredJohn Mihira Tozzi, DO  08/18/2017

## 2017-08-10 NOTE — Anesthesia Procedure Notes (Deleted)
Performed by: Merina Behrendt, CRNA       

## 2017-08-10 NOTE — Anesthesia Post-op Follow-up Note (Signed)
Anesthesia QCDR form completed.        

## 2017-08-11 ENCOUNTER — Other Ambulatory Visit: Payer: Self-pay | Admitting: Radiology

## 2017-08-11 ENCOUNTER — Encounter: Payer: Self-pay | Admitting: Surgery

## 2017-08-11 LAB — COMPREHENSIVE METABOLIC PANEL
ALBUMIN: 1.5 g/dL — AB (ref 3.5–5.0)
ALK PHOS: 101 U/L (ref 38–126)
ALT: 15 U/L — ABNORMAL LOW (ref 17–63)
AST: 24 U/L (ref 15–41)
Anion gap: 7 (ref 5–15)
BILIRUBIN TOTAL: 2.5 mg/dL — AB (ref 0.3–1.2)
BUN: 27 mg/dL — AB (ref 6–20)
CO2: 24 mmol/L (ref 22–32)
Calcium: 7.4 mg/dL — ABNORMAL LOW (ref 8.9–10.3)
Chloride: 114 mmol/L — ABNORMAL HIGH (ref 101–111)
Creatinine, Ser: 1.06 mg/dL (ref 0.61–1.24)
GFR calc Af Amer: >60 mL/min (ref >60)
GFR calc non Af Amer: >60 mL/min (ref >60)
GLUCOSE: 137 mg/dL — AB (ref 65–99)
POTASSIUM: 4.8 mmol/L (ref 3.5–5.1)
SODIUM: 145 mmol/L (ref 135–145)
Total Protein: 4.9 g/dL — ABNORMAL LOW (ref 6.5–8.1)

## 2017-08-11 LAB — GLUCOSE, CAPILLARY
GLUCOSE-CAPILLARY: 116 mg/dL — AB (ref 65–99)
GLUCOSE-CAPILLARY: 119 mg/dL — AB (ref 65–99)
Glucose-Capillary: 122 mg/dL — ABNORMAL HIGH (ref 65–99)
Glucose-Capillary: 124 mg/dL — ABNORMAL HIGH (ref 65–99)
Glucose-Capillary: 163 mg/dL — ABNORMAL HIGH (ref 65–99)

## 2017-08-11 LAB — CBC
HEMATOCRIT: 29.2 % — AB (ref 40.0–52.0)
Hemoglobin: 9.3 g/dL — ABNORMAL LOW (ref 13.0–18.0)
MCH: 24.8 pg — AB (ref 26.0–34.0)
MCHC: 31.8 g/dL — AB (ref 32.0–36.0)
MCV: 78.1 fL — ABNORMAL LOW (ref 80.0–100.0)
Platelets: 344 10*3/uL (ref 150–440)
RBC: 3.74 MIL/uL — ABNORMAL LOW (ref 4.40–5.90)
RDW: 19.1 % — AB (ref 11.5–14.5)
WBC: 15.5 10*3/uL — ABNORMAL HIGH (ref 3.8–10.6)

## 2017-08-11 LAB — PHOSPHORUS: PHOSPHORUS: 4.8 mg/dL — AB (ref 2.5–4.6)

## 2017-08-11 LAB — CULTURE, BLOOD (ROUTINE X 2)
Culture: NO GROWTH
Culture: NO GROWTH
SPECIAL REQUESTS: ADEQUATE

## 2017-08-11 LAB — URINE CULTURE: CULTURE: NO GROWTH

## 2017-08-11 LAB — PROTIME-INR
INR: 1.29
PROTHROMBIN TIME: 16 s — AB (ref 11.4–15.2)

## 2017-08-11 LAB — LACTIC ACID, PLASMA: Lactic Acid, Venous: 1.5 mmol/L (ref 0.5–1.9)

## 2017-08-11 LAB — TROPONIN I: Troponin I: <0.03 ng/mL (ref <0.03)

## 2017-08-11 LAB — MAGNESIUM: Magnesium: 1.8 mg/dL (ref 1.7–2.4)

## 2017-08-11 MED ORDER — ORAL CARE MOUTH RINSE
15.0000 mL | Freq: Two times a day (BID) | OROMUCOSAL | Status: DC
  Administered 2017-08-11 – 2017-08-14 (×5): 15 mL via OROMUCOSAL

## 2017-08-11 MED ORDER — TRACE MINERALS CR-CU-MN-SE-ZN 10-1000-500-60 MCG/ML IV SOLN
INTRAVENOUS | Status: AC
  Administered 2017-08-11: 18:00:00 via INTRAVENOUS
  Filled 2017-08-11: qty 960

## 2017-08-11 MED ORDER — INSULIN ASPART 100 UNIT/ML ~~LOC~~ SOLN
0.0000 [IU] | Freq: Four times a day (QID) | SUBCUTANEOUS | Status: DC
  Administered 2017-08-11: 2 [IU] via SUBCUTANEOUS
  Administered 2017-08-12 (×3): 3 [IU] via SUBCUTANEOUS
  Administered 2017-08-12 – 2017-08-13 (×2): 2 [IU] via SUBCUTANEOUS
  Administered 2017-08-13: 3 [IU] via SUBCUTANEOUS
  Administered 2017-08-13: 2 [IU] via SUBCUTANEOUS
  Administered 2017-08-13 – 2017-08-14 (×3): 3 [IU] via SUBCUTANEOUS
  Filled 2017-08-11 (×11): qty 1

## 2017-08-11 MED ORDER — MORPHINE SULFATE (PF) 2 MG/ML IV SOLN
2.0000 mg | INTRAVENOUS | Status: DC | PRN
  Administered 2017-08-11 – 2017-08-14 (×20): 2 mg via INTRAVENOUS
  Filled 2017-08-11 (×20): qty 1

## 2017-08-11 NOTE — Progress Notes (Signed)
POD # 1 Laparotomy w diverting loop ileostomy and drain placement POD # 7 E laparotomy  Difficult improvement over 24 hours.  Awake and alert.  Meeting some extubation criteria. HD adequate  PE NAD Follows commands intubated Abd: VAC in place, no leaks, retention sutures in place. Ileostomy retracted bu pink and viable.  A/P Sepsis from perforated viscus likely diverticular in nature Continue A/Bs TPN Appreciate critical care team assistance

## 2017-08-11 NOTE — Progress Notes (Signed)
Nutrition Follow-up  DOCUMENTATION CODES:   Obesity unspecified  INTERVENTION:  Recommend initiating Clinimix 5/15 with no electrolytes at 40 mL/hr. After 24 hours if electrolytes, CBGs, and triglycerides are within acceptable range can advance to goal regimen of Clinimix 5/15 at 75 mL/hr. Provides 1278 kcal, 96 grams of protein (62% estimated needs), 1800 mL fluid daily. Limited in ability to meet elevated protein needs with Clinimix.  Recommend assessing daily whether electrolytes will be needed in TPN.  Provide adult MVI and trace elements as daily TPN additives.  Withhold lipids for 7 days per ASPEN/SCCM guidelines.  Recommend measuring daily weights to help assess volume status on TPN.  NUTRITION DIAGNOSIS:   Inadequate oral intake related to inability to eat as evidenced by NPO status.  Ongoing.  GOAL:   Provide needs based on ASPEN/SCCM guidelines  Not met.  MONITOR:   Vent status, Labs, Weight trends, TF tolerance, I & O's  REASON FOR ASSESSMENT:   Ventilator    ASSESSMENT:   57 year old male with no significant PMHx presented for evaluation of abdominal pain, fever, N/V, and AMS found to have pneumoperitoneum and umbilical hernia s/p exploratory laparotomy, peritoneal lavage, and umbilical hernia repair early AM of 4/2 but no source of perforation was found.   -Right IJ was placed on 4/3 for dialysis access. Patient then had an improvement in UOP so dialysis was not started. -Patient began having liquid feces draining through base of incision on 4/6. -Returned to OR on 4/7 for re-exploration of recent laparotomy, abdominal washout, creation of diverting loop ileostomy, placement of bilateral pelvic Blake drains, closure of abdominal wound, and placement of wound VAC.   At time of RD assessment sedation was paused and patient in SBT. No family members at bedside. Noted wound VAC in place. Noted on abdominal x-ray from 4/6 mild diffuse distention of small and  large bowel may represent ileus (also seen on CT 4/5). Now day 7 of no nutrition.   Enteral Access: 18 Fr. NGT placed 4/2; terminates in mid-stomach per abdominal x-ray 4/2; 53 cm at right nare; on LIS  MAP: 76-87 mmHg  IV Access: right internal jugular triple-lumen placed 4/3; tip terminates at cavoatrial junction per chest x-ray 4/3  Patient is currently intubated on ventilator support MV: 9 L/min Temp (24hrs), Avg:99.9 F (37.7 C), Min:99 F (37.2 C), Max:100.9 F (38.3 C)  Propofol: currently paused; was at 2.9 mL/hr (77 kcal daily)  Medications reviewed and include: Novolog 0-15 units Q6hrs, fentanyl gtt, Levophed gtt currently stopped, Zosyn, propofol gtt.  Labs reviewed: CBG 107-119 past 24 hrs, Chloride 114, BUN 27, Phosphorus 4.8.  I/O: 1375 mL UOP yesterday (0.6 mL/kg/hr); 100 mL output from NGT; 230 mL output from right JP drain; 320 mL output from left JP drain; 50 mL output from wound VAC  Weight trend: 103.9 kg on 4/6 (no wt since then); +8.4 kg from weight on 4/2  Discussed with RN. Also discussed with surgeon and intensivist - plan is to start TPN today. Discussed with pharmacy - plan is to withhold electrolytes in TPN today.  Diet Order:  TPN (CLINIMIX) Adult without lytes  EDUCATION NEEDS:   No education needs have been identified at this time  Skin:  Skin Assessment: Skin Integrity Issues: Skin Integrity Issues:: Incisions Incisions: closed incision to abdomen with wound VAC in place  Last BM:  08/31/2017 - watery stool output from incision area; patient now with diverting loop ileostomy  Height:   Ht Readings from Last  1 Encounters:  08/05/17 '5\' 9"'  (1.753 m)    Weight:   Wt Readings from Last 1 Encounters:  08/09/17 229 lb 0.9 oz (103.9 kg)    Ideal Body Weight:  72.7 kg  BMI:  Body mass index is 33.83 kg/m.  Estimated Nutritional Needs:   Kcal:  2585-2778 (11-14 kcal/kg)  Protein:  >/= 145 grams (>/= 2 grams/kg IBW)  Fluid:  1.8-2.1  L/day (25-30 mL/kg IBW)  Willey Blade, MS, RD, LDN Office: [phone] Pager: [phone] After Hours/Weekend Pager: [phone]

## 2017-08-11 NOTE — Progress Notes (Signed)
PHARMACY - ADULT TOTAL PARENTERAL NUTRITION CONSULT NOTE   Pharmacy Consult for TPN Management  Indication: Bowel perfortation/NPO   Patient Measurements: Height: 5\' 9"  (175.3 cm) Weight: 229 lb 0.9 oz (103.9 kg) IBW/kg (Calculated) : 70.7 TPN AdjBW (KG): 76.9 Body mass index is 33.83 kg/m.  Assessment:  Pharmacy consulted for TPN management for 57 yo male started on TPN. Today is day 7 without nutrition. Patient had umbilical hernia repair on 4/2 and had diverting loop ileostomy on 4/7. Patient extubated on 4/8 with no plan to return to OR.    TPN Access: Right IJ TPN start date: 4/8    Current Nutrition: N/A  Plan:  Start Clinimix 5/15 no electrolytes @ 7640mL/hr. Will reaccess goal rate on 4/9; s/p extubation.  Will start SSI Q6hr.  Will obtains labs per protocol.   Pharmacy will continue to monitor and adjust per consult.   Makarios Madlock L 08/11/2017,12:19 PM

## 2017-08-11 NOTE — Progress Notes (Signed)
Pharmacy Antibiotic Note  Cory Graham is a 57 y.o. male admitted on 08/07/2017 with bowel perforation s/p exploratory laparotomy. Patient has been receiving Zosyn. Patient initially ordered anidulafungin. After discussion with Dr. Sampson GoonFitzgerald on 4/4, patient transition from anidulafungin to fluconazole.   Plan: Continue fluconazole to 400mg  IV Q24hr.  Continue Zosyn EI 4.5g IV Q8hr  Height: 5\' 9"  (175.3 cm) Weight: 229 lb 0.9 oz (103.9 kg) IBW/kg (Calculated) : 70.7  Temp (24hrs), Avg:100 F (37.8 C), Min:99 F (37.2 C), Max:100.9 F (38.3 C)  Recent Labs  Lab 08/05/17 1916 08/05/17 2221  08/06/17 0919 08/06/17 1240  08/07/17 1616 08/08/17 0311 08/09/17 0216 08/08/2017 0451 08/11/17 0516  WBC 15.0*  --    < >  --   --    < > 10.4 9.6 13.2* 10.5 15.5*  CREATININE 3.40*  --    < >  --  3.72*   < > 2.25* 1.83* 1.32* 1.07 1.06  LATICACIDVEN 3.2* 2.3*  --  1.3 1.4  --   --   --   --   --  1.5   < > = values in this interval not displayed.    Estimated Creatinine Clearance: 89.2 mL/min (by C-G formula based on SCr of 1.06 mg/dL).    No Known Allergies  Antimicrobials this admission: Zosyn 4/1 >>  vancomycin 4/4 >> 4/4 anidulafungin 4/4 x 1 Fluconazole 4/4 >>  Dose adjustments this admission: 4/6 Zosyn transitioned to 4.5g IV Q8hr.  4/6 Fluconazole transitioned to 400mg  IV Q24hr.   Microbiology results: 4/3 BCx: no growth x 5 days  4/5 BCx (only 1 set): no growth x 3 days  4/7 BCx: no growth x 1 day  4/2 Wound Cx: multiple organisms 4/3 UCx: no growth  4/7 UCx: no growth  4/7 Tracheal Aspirate: culture reincubated for better growth  4/2 MRSA PCR: negative  Thank you for allowing pharmacy to be a part of this patient's care.  Delmon Andrada L 08/11/2017 12:31 PM

## 2017-08-11 NOTE — Consult Note (Addendum)
WOC Nurse ostomy follow up: Initial assessment through pouch.  POD 1 Laparotomy with diverting loop ileostomy POD 6 Exploratory Laparotomy Stoma type/location: Right sided ileostomy.  Unable to determine quadrant today as umbilicus is obscured by the presence of NPWT dressing. Visit made with surgeon, Dr. Everlene FarrierPabon. Instructed to leave NPWT dressing intact for 5 days as long as it is functioning. Will attempt to remove pouching sytem from drape later this week without disturbing seal, assess stoma and re pouch. Patient is on vent in ICU with bilateral hand mitten restraints. No family in room. Plan is to attempt extubation today Stomal assessment/size: Visualized through pouch: oval, approximately 1 and 1/4 x 1 and 5/8 inches.  Slightly retracted, red, moist. OS appears to be in center. Peristomal assessment: not seen today Treatment options for stomal/peristomal skin: None Output: None. Scant serosanguinous drainage in pouch. Ostomy pouching: 2pc pouching system applied in OR is intact.  Education provided: None.  . [place]  WOC nursing team will follow along with you for ostomy care and teaching and will remain available to this patient, the nursing, surgical and medical teams.   Thanks, Ladona MowLaurie Bruk Tumolo, MSN, RN, GNP, Hans EdenCWOCN, CWON-AP, FAAN  Pager# [phone]

## 2017-08-11 NOTE — Progress Notes (Signed)
Pt seen on rounds this AM and care plan reviewed in detail and modified. Passed SBT this AM and extubated. Appears comfortable post-extubation. TPN to start today  Billy Fischeravid Simonds, MD PCCM service Mobile [phone] Pager [phone] 08/11/2017 1:41 PM

## 2017-08-11 NOTE — Progress Notes (Signed)
Pt extubated without stridor, no complications, placed on 4lpm [place], sats 93% respiratory rate 18/min, MD aware, will continue to monitor.

## 2017-08-12 ENCOUNTER — Inpatient Hospital Stay: Payer: Self-pay

## 2017-08-12 LAB — COMPREHENSIVE METABOLIC PANEL
ALT: 14 U/L — ABNORMAL LOW (ref 17–63)
ANION GAP: 4 — AB (ref 5–15)
AST: 26 U/L (ref 15–41)
Albumin: 1.4 g/dL — ABNORMAL LOW (ref 3.5–5.0)
Alkaline Phosphatase: 91 U/L (ref 38–126)
BUN: 19 mg/dL (ref 6–20)
CALCIUM: 7 mg/dL — AB (ref 8.9–10.3)
CHLORIDE: 115 mmol/L — AB (ref 101–111)
CO2: 28 mmol/L (ref 22–32)
CREATININE: 1 mg/dL (ref 0.61–1.24)
Glucose, Bld: 166 mg/dL — ABNORMAL HIGH (ref 65–99)
Potassium: 3.7 mmol/L (ref 3.5–5.1)
SODIUM: 147 mmol/L — AB (ref 135–145)
Total Bilirubin: 1.7 mg/dL — ABNORMAL HIGH (ref 0.3–1.2)
Total Protein: 4.9 g/dL — ABNORMAL LOW (ref 6.5–8.1)

## 2017-08-12 LAB — CULTURE, RESPIRATORY

## 2017-08-12 LAB — DIFFERENTIAL
BASOS ABS: 0 10*3/uL (ref 0–0.1)
Basophils Relative: 0 %
EOS ABS: 0.1 10*3/uL (ref 0–0.7)
EOS PCT: 1 %
Lymphocytes Relative: 9 %
Lymphs Abs: 0.8 10*3/uL — ABNORMAL LOW (ref 1.0–3.6)
Monocytes Absolute: 1 10*3/uL (ref 0.2–1.0)
Monocytes Relative: 10 %
Neutro Abs: 7.9 10*3/uL — ABNORMAL HIGH (ref 1.4–6.5)
Neutrophils Relative %: 80 %

## 2017-08-12 LAB — CBC
HCT: 22.2 % — ABNORMAL LOW (ref 40.0–52.0)
Hemoglobin: 7.4 g/dL — ABNORMAL LOW (ref 13.0–18.0)
MCH: 25.5 pg — AB (ref 26.0–34.0)
MCHC: 33.3 g/dL (ref 32.0–36.0)
MCV: 76.7 fL — ABNORMAL LOW (ref 80.0–100.0)
PLATELETS: 399 10*3/uL (ref 150–440)
RBC: 2.89 MIL/uL — AB (ref 4.40–5.90)
RDW: 18.5 % — ABNORMAL HIGH (ref 11.5–14.5)
WBC: 9.8 10*3/uL (ref 3.8–10.6)

## 2017-08-12 LAB — GLUCOSE, CAPILLARY
GLUCOSE-CAPILLARY: 146 mg/dL — AB (ref 65–99)
GLUCOSE-CAPILLARY: 157 mg/dL — AB (ref 65–99)
GLUCOSE-CAPILLARY: 151 mg/dL — AB (ref 65–99)

## 2017-08-12 LAB — PHOSPHORUS: PHOSPHORUS: 3.4 mg/dL (ref 2.5–4.6)

## 2017-08-12 LAB — MAGNESIUM: MAGNESIUM: 1.9 mg/dL (ref 1.7–2.4)

## 2017-08-12 LAB — TRIGLYCERIDES: TRIGLYCERIDES: 127 mg/dL (ref <150)

## 2017-08-12 LAB — CULTURE, RESPIRATORY W GRAM STAIN

## 2017-08-12 MED ORDER — PANTOPRAZOLE SODIUM 40 MG IV SOLR
40.0000 mg | INTRAVENOUS | Status: DC
  Administered 2017-08-12 – 2017-08-14 (×3): 40 mg via INTRAVENOUS
  Filled 2017-08-12 (×3): qty 40

## 2017-08-12 MED ORDER — FAT EMULSION PLANT BASED 20 % IV EMUL
500.0000 mL | INTRAVENOUS | Status: DC
  Administered 2017-08-12: 500 mL via INTRAVENOUS
  Filled 2017-08-12: qty 500

## 2017-08-12 MED ORDER — FENTANYL 25 MCG/HR TD PT72: 50.0000 ug | MEDICATED_PATCH | TRANSDERMAL | Status: DC

## 2017-08-12 MED ORDER — TRACE MINERALS CR-CU-MN-SE-ZN 10-1000-500-60 MCG/ML IV SOLN
INTRAVENOUS | Status: AC
  Administered 2017-08-12: 19:00:00 via INTRAVENOUS
  Filled 2017-08-12: qty 1992

## 2017-08-12 MED ORDER — FENTANYL 50 MCG/HR TD PT72
50.0000 ug | MEDICATED_PATCH | TRANSDERMAL | Status: DC
  Administered 2017-08-12: 50 ug via TRANSDERMAL
  Filled 2017-08-12: qty 1

## 2017-08-12 NOTE — Consult Note (Signed)
WOC Nurse ostomy follow up POD 2 laparotomy with diverting loop ileostomy; POD 7  Stoma type/location: right sided ileostomy Stomal assessment/size: visualized through pouch; approximately 1 and 1/4 inches x 1 and 5/8 inches. Peristomal assessment: not seen today Treatment options for stomal/peristomal skin: none Output: Sweat in pouch  Ostomy pouching: 2pc. applied in OR is intact.  No return of bowel function.   Education provided: None. Patient is extubates, alert but not conversive and no family in room. [place]: No  WOC Nurse wound consult note Reason for Consult: Maintenance of incisional NPWT dressing placed in OR on Sunday, 08/31/2017 for 5 days.  Plan to remove on Friday if this meets with surgical plan of Dr. Everlene FarrierPabon. Wound type:Surgical Pressure Injury POA: NA Measurement: Unable to Assess Wound bed: Unable to visualize.  Per Dr. Earlene Plateravis' note, 4 Bolstered retention sutures are beneath the NPWT dressing along with widely spaced skin level staples. Drainage (amount, consistency, odor) NPWT system is intact. Periwound: Unable to visualize Dressing procedure/placement/frequency:Midline wound with Prevena-type (Incisional) NPWT dressing applied in surgery.  Per discussion at bedside yesterday with Dr. Everlene FarrierPabon, will leave in place for 5 days if stable.  WOC nursing team will continue to follow, and will remain available to this patient, the nursing, surgical and medical teams.  Please re-consult if needed. Thanks, Ladona MowLaurie Tejasvi Brissett, MSN, RN, GNP, Hans EdenCWOCN, CWON-AP, FAAN  Pager# [phone]

## 2017-08-12 NOTE — Progress Notes (Signed)
Pharmacy Antibiotic Note  Cory Graham is a 57 y.o. male admitted on 08/14/2017 with bowel perforation s/p exploratory laparotomy. Patient has been receiving Zosyn. Patient initially ordered anidulafungin. After discussion with Dr. Sampson GoonFitzgerald on 4/4, patient transition from anidulafungin to fluconazole.   Plan: Continue fluconazole to 400mg  IV Q24hr.  Continue Zosyn EI 4.5g IV Q8hr  Height: 5\' 9"  (175.3 cm) Weight: 224 lb 13.9 oz (102 kg) IBW/kg (Calculated) : 70.7  Temp (24hrs), Avg:98.9 F (37.2 C), Min:97.8 F (36.6 C), Max:99.9 F (37.7 C)  Recent Labs  Lab 08/05/17 2221  08/06/17 0919 08/06/17 1240  08/08/17 0311 08/09/17 0216 08/25/2017 0451 08/11/17 0516 08/12/17 0459  WBC  --    < >  --   --    < > 9.6 13.2* 10.5 15.5* 9.8  CREATININE  --    < >  --  3.72*   < > 1.83* 1.32* 1.07 1.06 1.00  LATICACIDVEN 2.3*  --  1.3 1.4  --   --   --   --  1.5  --    < > = values in this interval not displayed.    Estimated Creatinine Clearance: 93.6 mL/min (by C-G formula based on SCr of 1 mg/dL).    No Known Allergies  Antimicrobials this admission: Zosyn 4/1 >>  vancomycin 4/4 >> 4/4 anidulafungin 4/4 x 1 Fluconazole 4/4 >>  Dose adjustments this admission: 4/6 Zosyn transitioned to 4.5g IV Q8hr.  4/6 Fluconazole transitioned to 400mg  IV Q24hr.   Microbiology results: 4/3 BCx: no growth x 5 days  4/5 BCx (only 1 set): no growth x 3 days  4/7 BCx: no growth x 2 day  4/2 Wound Cx: multiple organisms 4/3 UCx: no growth  4/7 UCx: no growth  4/7 Tracheal Aspirate: few candida albicans  4/2 MRSA PCR: negative  Thank you for allowing pharmacy to be a part of this patient's care.  Dondre Catalfamo L 08/12/2017 8:18 PM

## 2017-08-12 NOTE — Progress Notes (Addendum)
Pt found to have removed ostomy appliance.  Appliance replaced.  PT educated.  Pt found with NG tube out.

## 2017-08-12 NOTE — Op Note (Signed)
POD # 2 Laparotomy w diverting loop ileostomy and drain placement POD # 8 E laparotomy  Awake and alert.  Follows commands intermittently, some confusion  extubated yesterday. HD adequate NEW sulcus type of drainage from Left LQ drain.  PE NAD Follows commands intubated Abd: VAC in place, no leaks, retention sutures in place. Ileostomy retracted but pink and viable. Now thick purulent vs fecal material coming from left JP  A/P Sepsis from perforated viscus likely diverticular in nature New controlled fistula, pt is not peritonitic and is slowly improving. Given this we will treat conservatively Continue A/Bs TPN for Ileus  Appreciate critical care team assistance

## 2017-08-12 NOTE — Progress Notes (Signed)
PHARMACY - ADULT TOTAL PARENTERAL NUTRITION CONSULT NOTE   Pharmacy Consult for TPN Management  Indication: Bowel perfortation/NPO   Patient Measurements: Height: 5\' 9"  (175.3 cm) Weight: 224 lb 13.9 oz (102 kg) IBW/kg (Calculated) : 70.7 TPN AdjBW (KG): 76.9 Body mass index is 33.21 kg/m.  Assessment:  Pharmacy consulted for TPN management for 57 yo male started on TPN. Today is day 7 without nutrition. Patient had umbilical hernia repair on 4/2 and had diverting loop ileostomy on 4/7. Patient extubated on 4/8 with no plan to return to OR.    TPN Access: Right IJ TPN start date: 4/8    Current Nutrition: N/A  Plan:  Continue Clinimix 5/15 no electrolytes @ 6783mL/hr and Fat emulsions @ 5225mL/hr.  Continue SSI Q6hr.  Will obtains labs per protocol.  Protonix IV added on AM rounds.   Pharmacy will continue to monitor and adjust per consult.   Kadee Philyaw L 08/12/2017,8:21 PM

## 2017-08-12 NOTE — Progress Notes (Signed)
Pt removed surgical placed NG tube.  Spoke with Dr Excell Seltzerooper.  May leave tube out at this time.

## 2017-08-12 NOTE — Progress Notes (Signed)
eLink Physician-Brief Progress Note Patient Name: Cory Graham DOB: October 16, 1960 MRN: [id number]   Date of Service  08/12/2017  HPI/Events of Note  Agitation - Patient picking at ileostomy bag. Request for bilateral soft wrist restraints.   eICU Interventions  Will order: 1. Bilateral soft wrist restraints.      Intervention Category Minor Interventions: Agitation / anxiety - evaluation and management  Sommer,Steven Eugene 08/12/2017, 10:50 PM

## 2017-08-12 NOTE — Progress Notes (Signed)
Nutrition Follow-up  DOCUMENTATION CODES:   Obesity unspecified  INTERVENTION:  Recommend advancing to new goal TPN regimen of Clinimix 5/15 with no electrolytes at 83 mL/hr. Also initiate 20% ILE at 25 mL/hr over 12 hours. Provides 2014 kcal, 100 grams of protein, 2292 mL fluid daily including lipids.  Continue adult MVI and trace minerals as daily TPN additives.  Continue assessing daily whether electrolytes will be needed in TPN.  NUTRITION DIAGNOSIS:   Inadequate oral intake related to inability to eat as evidenced by NPO status.  Ongoing.  GOAL:   Provide needs based on ASPEN/SCCM guidelines  Met with TPN.  MONITOR:   Vent status, Labs, Weight trends, TF tolerance, I & O's  REASON FOR ASSESSMENT:   Ventilator, Consult New TPN/TNA  ASSESSMENT:   57 year old male with no significant PMHx presented for evaluation of abdominal pain, fever, N/V, and AMS found to have pneumoperitoneum and umbilical hernia s/p exploratory laparotomy, peritoneal lavage, and umbilical hernia repair early AM of 4/2 but no source of perforation was found.   -Right IJ was placed on 4/3 for dialysis access. Patient then had an improvement in UOP so dialysis was not started. -Patient began having liquid feces draining through base of incision on 4/6. -Returned to OR on 4/7 for re-exploration of recent laparotomy, abdominal washout, creation of diverting loop ileostomy, placement of bilateral pelvic Blake drains, closure of abdominal wound, and placement of wound VAC.  -Patient was extubated on 4/8.  Met with patient at bedside. He was sleeping at time of assessment. Currently on 2 L nasal cannula.   IV Access: right internal jugular triple-lumen placed 4/3; tip terminates at cavoatrial junction per chest x-ray 4/3  TPN: pt was started on Clinimix 5/15 with no electrolytes at 40 mL/hr  Medications reviewed and include: Novolog 0-15 units Q6hrs (7 units past 24 hrs), Diflucan, Zosyn.  Labs  reviewed: CBG 122-163 past 24 hrs, Sodium 147 (trending up from 145 yesterday), Chloride 115, Calcium 7 (corrects to 9.08 with albumin of 1.4). Electrolytes WNL and Triglycerides 127.  I/O: 2050 mL UOP yesterday (0.8 mL/kg/hr); 105 mL output from NGT; 69 mL output from right JP drain; 266 mL output from left JP drain; 200 mL output from wound VAC; no output from ileostomy yet  Discussed with RN and on rounds with MD and pharmacy. Plan is to withhold electrolytes again today and initiate lipids today.  Diet Order:  TPN (CLINIMIX) Adult without lytes  EDUCATION NEEDS:   No education needs have been identified at this time  Skin:  Skin Assessment: Skin Integrity Issues: Skin Integrity Issues:: Incisions Incisions: closed incision to abdomen with wound VAC in place  Last BM:  08/22/2017 - watery stool output from incision area; patient now with diverting loop ileostomy  Height:   Ht Readings from Last 1 Encounters:  08/05/17 '5\' 9"'  (1.753 m)    Weight:   Wt Readings from Last 1 Encounters:  08/12/17 224 lb 13.9 oz (102 kg)    Ideal Body Weight:  72.7 kg  BMI:  Body mass index is 33.21 kg/m.  Estimated Nutritional Needs:   Kcal:  1940-2115 (MSJ x 1.1-1.2)  Protein:  105-125 grams (1.1-1.3 grams/kg)  Fluid:  1.8-2.1 L/day (25-30 mL/kg IBW)  Willey Blade, MS, RD, LDN Office: [phone] Pager: [phone] After Hours/Weekend Pager: [phone]

## 2017-08-13 DIAGNOSIS — R5381 Other malaise: Secondary | ICD-10-CM

## 2017-08-13 DIAGNOSIS — R6520 Severe sepsis without septic shock: Secondary | ICD-10-CM

## 2017-08-13 LAB — CULTURE, BLOOD (ROUTINE X 2)
Culture: NO GROWTH
SPECIAL REQUESTS: ADEQUATE

## 2017-08-13 LAB — CBC
HEMATOCRIT: 24.5 % — AB (ref 40.0–52.0)
Hemoglobin: 7.9 g/dL — ABNORMAL LOW (ref 13.0–18.0)
MCH: 25.2 pg — AB (ref 26.0–34.0)
MCHC: 32.2 g/dL (ref 32.0–36.0)
MCV: 78.3 fL — AB (ref 80.0–100.0)
PLATELETS: 502 10*3/uL — AB (ref 150–440)
RBC: 3.13 MIL/uL — ABNORMAL LOW (ref 4.40–5.90)
RDW: 19 % — ABNORMAL HIGH (ref 11.5–14.5)
WBC: 8.7 10*3/uL (ref 3.8–10.6)

## 2017-08-13 LAB — BASIC METABOLIC PANEL
Anion gap: 4 — ABNORMAL LOW (ref 5–15)
BUN: 16 mg/dL (ref 6–20)
CALCIUM: 6.9 mg/dL — AB (ref 8.9–10.3)
CO2: 29 mmol/L (ref 22–32)
CREATININE: 0.94 mg/dL (ref 0.61–1.24)
Chloride: 113 mmol/L — ABNORMAL HIGH (ref 101–111)
GFR calc Af Amer: >60 mL/min (ref >60)
GFR calc non Af Amer: >60 mL/min (ref >60)
Glucose, Bld: 187 mg/dL — ABNORMAL HIGH (ref 65–99)
Potassium: 3.3 mmol/L — ABNORMAL LOW (ref 3.5–5.1)
SODIUM: 146 mmol/L — AB (ref 135–145)

## 2017-08-13 LAB — PHOSPHORUS: Phosphorus: 3.4 mg/dL (ref 2.5–4.6)

## 2017-08-13 LAB — GLUCOSE, CAPILLARY
GLUCOSE-CAPILLARY: 180 mg/dL — AB (ref 65–99)
GLUCOSE-CAPILLARY: 191 mg/dL — AB (ref 65–99)
Glucose-Capillary: 142 mg/dL — ABNORMAL HIGH (ref 65–99)
Glucose-Capillary: 143 mg/dL — ABNORMAL HIGH (ref 65–99)

## 2017-08-13 LAB — MAGNESIUM: MAGNESIUM: 1.6 mg/dL — AB (ref 1.7–2.4)

## 2017-08-13 MED ORDER — MAGNESIUM SULFATE 4 GM/100ML IV SOLN
4.0000 g | Freq: Once | INTRAVENOUS | Status: AC
  Administered 2017-08-13: 4 g via INTRAVENOUS
  Filled 2017-08-13: qty 100

## 2017-08-13 MED ORDER — SODIUM CHLORIDE 0.9% FLUSH: 10.0000 mL | INTRAVENOUS | Status: DC | PRN

## 2017-08-13 MED ORDER — FAT EMULSION PLANT BASED 20 % IV EMUL
500.0000 mL | INTRAVENOUS | Status: DC
  Administered 2017-08-13: 500 mL via INTRAVENOUS
  Filled 2017-08-13: qty 500

## 2017-08-13 MED ORDER — POTASSIUM CHLORIDE 10 MEQ/50ML IV SOLN
10.0000 meq | INTRAVENOUS | Status: AC
  Administered 2017-08-13 (×3): 10 meq via INTRAVENOUS
  Filled 2017-08-13 (×3): qty 50

## 2017-08-13 MED ORDER — ENOXAPARIN SODIUM 40 MG/0.4ML ~~LOC~~ SOLN
40.0000 mg | SUBCUTANEOUS | Status: DC
  Administered 2017-08-13 – 2017-08-15 (×2): 40 mg via SUBCUTANEOUS
  Filled 2017-08-13 (×2): qty 0.4

## 2017-08-13 MED ORDER — TRACE MINERALS CR-CU-MN-SE-ZN 10-1000-500-60 MCG/ML IV SOLN
INTRAVENOUS | Status: AC
  Administered 2017-08-13: 19:00:00 via INTRAVENOUS
  Filled 2017-08-13: qty 1992

## 2017-08-13 MED ORDER — SODIUM CHLORIDE 0.9% FLUSH
10.0000 mL | Freq: Two times a day (BID) | INTRAVENOUS | Status: DC
  Administered 2017-08-13 – 2017-08-18 (×6): 10 mL
  Administered 2017-08-19: 30 mL
  Administered 2017-08-20: 10 mL

## 2017-08-13 NOTE — Progress Notes (Signed)
PHARMACY - ADULT TOTAL PARENTERAL NUTRITION CONSULT NOTE   Pharmacy Consult for TPN Management  Indication: Bowel perfortation/NPO   Patient Measurements: Height: 5\' 9"  (175.3 cm) Weight: 224 lb 13.9 oz (102 kg) IBW/kg (Calculated) : 70.7 TPN AdjBW (KG): 76.9 Body mass index is 33.21 kg/m.  Assessment:  Pharmacy consulted for TPN management for 57 yo male started on TPN. Today is day 7 without nutrition. Patient had umbilical hernia repair on 4/2 and had diverting loop ileostomy on 4/7. Patient extubated on 4/8 with no plan to return to OR.    TPN Access: PICC (placed 4/10)  TPN start date: 4/8    Current Nutrition: N/A  Plan:  Replace potassium 10mEq IV Q1hr x 3 doses and magnesium 4g IV x 1.  Continue Clinimix 5/15 with electrolytes @ 4983mL/hr and Fat emulsions @ 4125mL/hr.  Continue SSI Q6hr.  Will obtains labs per protocol.  Protonix IV Daily.   Pharmacy will continue to monitor and adjust per consult.   Simpson,Michael L 08/13/2017,8:36 PM

## 2017-08-13 NOTE — Progress Notes (Signed)
POD # 3 Laparotomy w diverting loop ileostomy and drain placement POD # 9 E laparotomy  Awake and alert.  Follows commands intermittently, some confusion  ICU delirium HD adequate NEW sulcus type of drainage from Left LQ drain. Pulled nGT out last night  Only some small dark liquid in ileostomy bag   LEFT JP 100c  PE NAD Follows commands intubated Abd: VAC in place, no leaks, retention sutures in place. Ileostomy retracted but pink and viable. Now thick purulent vs fecal material coming from left JP  A/P Sepsis from perforated viscus likely diverticular in nature New controlled fistula, pt is not peritonitic and is slowly improving. Given this we will treat conservatively Continue A/Bs TPN for Ileus  Keep NPO for now, ileostomy has not started to work yet Hold off ngt Appreciate critical care team assistance

## 2017-08-13 NOTE — Progress Notes (Signed)
Peripherally Inserted Central Catheter/Midline Placement  The IV Nurse has discussed with the patient and/or persons authorized to consent for the patient, the purpose of this procedure and the potential benefits and risks involved with this procedure.  The benefits include less needle sticks, lab draws from the catheter, and the patient may be discharged home with the catheter. Risks include, but not limited to, infection, bleeding, blood clot (thrombus formation), and puncture of an artery; nerve damage and irregular heartbeat and possibility to perform a PICC exchange if needed/ordered by physician.  Alternatives to this procedure were also discussed.  Bard Power PICC patient education guide, fact sheet on infection prevention and patient information card has been provided to patient /or left at bedside.    PICC/Midline Placement Documentation     Consent obtained with wife at bedside   Timmothy Soursewman, Xareni Kelch Renee 08/13/2017, 3:35 PM

## 2017-08-13 NOTE — Evaluation (Signed)
Physical Therapy Evaluation Patient Details Name: Cory Graham MRN: [id number] DOB: Oct 25, 1960 Today's Date: 08/13/2017   History of Present Illness  presented to ER secondary to worsening abdominal pain, nausea/vomiting, fever; admitted with sepsis related to bowel perforation.  Status post exploratory laparotomy (4/3) for pneumoperitomeum with no obvious source of perforation noted.  Post-op course complicated by agitation, AKI, respiratory failure (with extended intubation), hypotension (requiring pressors).  Underwent re-exploration of recent laparotomy (4/7) with abdominal washout, ilesotomy creation, bilat pelvic drains, wound closure with VAC placement.  Currently with temp dialysis catheter R IJ and bilat soft wrist restraints in place.  Clinical Impression  Upon evaluation, patient alert and oriented to self; follows simple commands with consistent encouragement for alertness, active attention to task.  Profoundly weak and deconditioned throughout all extremities, requiring act assist for movement throughout full range against gravity.  Currently requires dependent/total assist +2 for bed mobility and unsupported sitting balance.  Very restless once upright (vitals stable) with heavy posterior trunk lean/weight shift with no self-righting attempts noted. Unsafe/unable to attempt additional mobility or OOB at this time.  Will continue mobiltiy assessment and progression as appropriate. Would benefit from skilled PT to address above deficits and promote optimal return to PLOF; recommend transition to STR upon discharge from acute hospitalization.     Follow Up Recommendations SNF    Equipment Recommendations       Recommendations for Other Services       Precautions / Restrictions Precautions Precautions: Fall Precaution Comments: NPO-TPN; bilat soft wrist restraints per order Restrictions Weight Bearing Restrictions: No      Mobility  Bed Mobility Overal bed mobility: Needs  Assistance Bed Mobility: Supine to Sit;Sit to Supine     Supine to sit: Total assist;+2 for physical assistance Sit to supine: Total assist;+2 for physical assistance   General bed mobility comments: very limited ability to actively assist with movement transition; very restless once upright, heavy posterior weight shift/trunk lean with absent trunk control and self-righting  Transfers                 General transfer comment: unsafe/unable  Ambulation/Gait             General Gait Details: unsafe/unable  Stairs            Wheelchair Mobility    Modified Rankin (Stroke Patients Only)       Balance Overall balance assessment: Needs assistance Sitting-balance support: No upper extremity supported;Feet supported Sitting balance-Leahy Scale: Zero   Postural control: Posterior lean     Standing balance comment: unsafe/unable                             Pertinent Vitals/Pain Pain Assessment: Faces Faces Pain Scale: Hurts even more Pain Location: abdomen Pain Descriptors / Indicators: Aching;Grimacing;Guarding Pain Intervention(s): Limited activity within patient's tolerance;Monitored during session;Repositioned    Home Living                   Additional Comments: Patient unable to provide; wife not available.  Will verify with family as appropriate.    Prior Function           Comments: Patient unable to provide; wife not available.  Will verify with family as appropriate.     Hand Dominance        Extremity/Trunk Assessment   Upper Extremity Assessment Upper Extremity Assessment: Generalized weakness(grossly 3-/5 throughout)    Lower Extremity Assessment Lower  Extremity Assessment: Generalized weakness(grossly 3-/5 throughout)       Communication   Communication: No difficulties  Cognition Arousal/Alertness: Lethargic Behavior During Therapy: Flat affect Overall Cognitive Status: Difficult to assess                                  General Comments: lethargic, constant encouragement for alertness and participation      General Comments      Exercises Other Exercises Other Exercises: Supine UE/LE therex, 1x10, act assist ROM: ankle pumps, heel slides, hip abduct/adduct.  Step by step verbal cuing for alertness, attention to task and active effort.  Profound weakness and deconditioning evident.   Assessment/Plan    PT Assessment Patient needs continued PT services  PT Problem List Decreased strength;Decreased range of motion;Decreased activity tolerance;Decreased balance;Decreased knowledge of use of DME;Decreased mobility;Decreased safety awareness;Decreased knowledge of precautions;Decreased coordination;Cardiopulmonary status limiting activity;Decreased skin integrity;Pain       PT Treatment Interventions DME instruction;Gait training;Stair training;Functional mobility training;Balance training;Therapeutic activities;Therapeutic exercise;Cognitive remediation;Patient/family education    PT Goals (Current goals can be found in the Care Plan section)  Acute Rehab PT Goals PT Goal Formulation: Patient unable to participate in goal setting Time For Goal Achievement: 08/27/17 Potential to Achieve Goals: Fair Additional Goals Additional Goal #1: Assess and establish goals for OOB/gait as appropriate.    Frequency Min 2X/week   Barriers to discharge Decreased caregiver support      Co-evaluation               AM-PAC PT "6 Clicks" Daily Activity  Outcome Measure Difficulty turning over in bed (including adjusting bedclothes, sheets and blankets)?: Unable Difficulty moving from lying on back to sitting on the side of the bed? : Unable Difficulty sitting down on and standing up from a chair with arms (e.g., wheelchair, bedside commode, etc,.)?: Unable Help needed moving to and from a bed to chair (including a wheelchair)?: Total Help needed walking in hospital  room?: Total Help needed climbing 3-5 steps with a railing? : Total 6 Click Score: 6    End of Session Equipment Utilized During Treatment: Oxygen Activity Tolerance: Patient limited by fatigue;Patient limited by pain Patient left: in bed;with call bell/phone within reach;with bed alarm set;with restraints reapplied Nurse Communication: Mobility status PT Visit Diagnosis: Muscle weakness (generalized) (M62.81);Difficulty in walking, not elsewhere classified (R26.2);Pain    Time: 8119-1478 PT Time Calculation (min) (ACUTE ONLY): 23 min   Charges:   PT Evaluation $PT Eval High Complexity: 1 High PT Treatments $Therapeutic Exercise: 8-22 mins   PT G Codes:        Cory Graham, PT, DPT, NCS 08/13/17, 10:44 AM [phone]

## 2017-08-13 NOTE — Progress Notes (Addendum)
He has tolerated extubation well His cognition appears intact There is no respiratory distress  Vitals:   08/13/17 1000 08/13/17 1100 08/13/17 1200 08/13/17 1300  BP: (!) 141/74 130/70 (!) 141/77 (!) 149/75  Pulse: 86 91 88 86  Resp: (!) 24 (!) 22 (!) 25 (!) 25  Temp:   99.3 F (37.4 C)   TempSrc:   Axillary   SpO2: 97% 95% 96% 97%  Weight:      Height:      RA  Gen: NAD HEENT: NCAT, sclera white Neck: No JVD noted Lungs: No wheezes or other adventitious sounds noted anteriorly Cardiovascular: Regular, no murmurs Abdomen: Distended, minimally tender, diminished to absent BS Ext: without clubbing, cyanosis, edema Neuro: grossly intact Skin: Limited exam, no lesions noted  BMP Latest Ref Rng & Units 08/13/2017 08/12/2017 08/11/2017  Glucose 65 - 99 mg/dL 454(U187(H) 981(X166(H) 914(N137(H)  BUN 6 - 20 mg/dL 16 19 82(N27(H)  Creatinine 0.61 - 1.24 mg/dL 5.620.94 1.301.00 8.651.06  Sodium 135 - 145 mmol/L 146(H) 147(H) 145  Potassium 3.5 - 5.1 mmol/L 3.3(L) 3.7 4.8  Chloride 101 - 111 mmol/L 113(H) 115(H) 114(H)  CO2 22 - 32 mmol/L 29 28 24   Calcium 8.9 - 10.3 mg/dL 6.9(L) 7.0(L) 7.4(L)    CBC Latest Ref Rng & Units 08/13/2017 08/12/2017 08/11/2017  WBC 3.8 - 10.6 K/uL 8.7 9.8 15.5(H)  Hemoglobin 13.0 - 18.0 g/dL 7.9(L) 7.4(L) 9.3(L)  Hematocrit 40.0 - 52.0 % 24.5(L) 22.2(L) 29.2(L)  Platelets 150 - 440 K/uL 502(H) 399 344    CXR: NNF  IMPRESSION: Severe sepsis Peritonitis Occult bowel perf On TPN Deconditioning  PLAN/REC: Cont current abx - pip-tazo, fluconazole Cont TPN - PICC scheduled R IJ HD cath to be removed after PICC placed PT/OT ordered    Billy Fischeravid Lenaya Pietsch, MD PCCM service Mobile [phone] Pager [phone] 08/13/2017 4:48 PM

## 2017-08-13 NOTE — Progress Notes (Signed)
Pharmacy Antibiotic Note  Cory Graham is a 57 y.o. male admitted on 08/15/2017 with bowel perforation s/p exploratory laparotomy. Patient has been receiving Zosyn. Patient initially ordered anidulafungin. After discussion with Dr. Sampson GoonFitzgerald on 4/4, patient transition from anidulafungin to fluconazole.   Plan: Continue fluconazole to 400mg  IV Q24hr.  Continue Zosyn EI 4.5g IV Q8hr, if patient remains afebrile will transition dosing abck to 3.375g IV Q8hr on 4/11.   Height: 5\' 9"  (175.3 cm) Weight: 224 lb 13.9 oz (102 kg) IBW/kg (Calculated) : 70.7  Temp (24hrs), Avg:99.6 F (37.6 C), Min:99.3 F (37.4 C), Max:99.9 F (37.7 C)  Recent Labs  Lab 08/09/17 0216 08/18/2017 0451 08/11/17 0516 08/12/17 0459 08/13/17 0509  WBC 13.2* 10.5 15.5* 9.8 8.7  CREATININE 1.32* 1.07 1.06 1.00 0.94  LATICACIDVEN  --   --  1.5  --   --     Estimated Creatinine Clearance: 99.6 mL/min (by C-G formula based on SCr of 0.94 mg/dL).    No Known Allergies  Antimicrobials this admission: Zosyn 4/1 >>  vancomycin 4/4 >> 4/4 anidulafungin 4/4 x 1 Fluconazole 4/4 >>  Dose adjustments this admission: 4/6 Zosyn transitioned to 4.5g IV Q8hr.  4/6 Fluconazole transitioned to 400mg  IV Q24hr.   Microbiology results: 4/3 BCx: no growth x 5 days  4/5 BCx (only 1 set): no growth x 5 days  4/7 BCx: no growth x 3 day  4/2 Wound Cx: multiple organisms 4/3 UCx: no growth  4/7 UCx: no growth  4/7 Tracheal Aspirate: few candida albicans  4/2 MRSA PCR: negative  Thank you for allowing pharmacy to be a part of this patient's care.  Cory Graham 08/13/2017 8:38 PM

## 2017-08-13 NOTE — Consult Note (Signed)
WOC Nurse wound and ostomy follow up Stoma type/location: Patient had a restless night and pulled his ostomy pouching system away from his body in addition to disrupting part of the NPWT dressing.  Bedside RN replaced pouching system and was able to patch NPWT dressing achieving an excellent seal at 125mmHg.  Ileostomy beginning to function, small amount of dark brown liquid effluent in pouch.  Discussed with Dr. Everlene FarrierPabon and at this time we still plan to take down dressing and assess wound and ostomy on Friday am. I will continue to check in daily and plan for dressing and pouch change on Friday.  WOC nursing team will follow, and will remain available to this patient, the nursing, surgical and medical teams.  Thanks, Ladona MowLaurie Markeem Noreen, MSN, RN, GNP, Hans EdenCWOCN, CWON-AP, FAAN  Pager# [phone]

## 2017-08-14 ENCOUNTER — Encounter: Admission: EM | Disposition: E | Payer: Self-pay | Source: Home / Self Care | Attending: Surgery

## 2017-08-14 ENCOUNTER — Inpatient Hospital Stay: Payer: Medicaid Other | Admitting: Anesthesiology

## 2017-08-14 ENCOUNTER — Inpatient Hospital Stay: Payer: Medicaid Other

## 2017-08-14 DIAGNOSIS — R410 Disorientation, unspecified: Secondary | ICD-10-CM

## 2017-08-14 HISTORY — PX: WOUND DEBRIDEMENT: SHX247

## 2017-08-14 HISTORY — PX: APPLICATION OF WOUND VAC: SHX5189

## 2017-08-14 LAB — BASIC METABOLIC PANEL
Anion gap: 4 — ABNORMAL LOW (ref 5–15)
BUN: 17 mg/dL (ref 6–20)
CO2: 29 mmol/L (ref 22–32)
CREATININE: 1.02 mg/dL (ref 0.61–1.24)
Calcium: 7.1 mg/dL — ABNORMAL LOW (ref 8.9–10.3)
Chloride: 113 mmol/L — ABNORMAL HIGH (ref 101–111)
GFR calc Af Amer: >60 mL/min (ref >60)
GLUCOSE: 145 mg/dL — AB (ref 65–99)
POTASSIUM: 3 mmol/L — AB (ref 3.5–5.1)
SODIUM: 146 mmol/L — AB (ref 135–145)

## 2017-08-14 LAB — GLUCOSE, CAPILLARY
GLUCOSE-CAPILLARY: 162 mg/dL — AB (ref 65–99)
GLUCOSE-CAPILLARY: 166 mg/dL — AB (ref 65–99)
GLUCOSE-CAPILLARY: 173 mg/dL — AB (ref 65–99)
GLUCOSE-CAPILLARY: 129 mg/dL — AB (ref 65–99)
Glucose-Capillary: 134 mg/dL — ABNORMAL HIGH (ref 65–99)
Glucose-Capillary: 144 mg/dL — ABNORMAL HIGH (ref 65–99)

## 2017-08-14 LAB — COMPREHENSIVE METABOLIC PANEL
ALT: 13 U/L — ABNORMAL LOW (ref 17–63)
ANION GAP: 4 — AB (ref 5–15)
AST: 29 U/L (ref 15–41)
Albumin: 1.4 g/dL — ABNORMAL LOW (ref 3.5–5.0)
Alkaline Phosphatase: 104 U/L (ref 38–126)
BILIRUBIN TOTAL: 2.2 mg/dL — AB (ref 0.3–1.2)
BUN: 15 mg/dL (ref 6–20)
CHLORIDE: 111 mmol/L (ref 101–111)
CO2: 28 mmol/L (ref 22–32)
Calcium: 7 mg/dL — ABNORMAL LOW (ref 8.9–10.3)
Creatinine, Ser: 0.94 mg/dL (ref 0.61–1.24)
Glucose, Bld: 395 mg/dL — ABNORMAL HIGH (ref 65–99)
Potassium: 3.5 mmol/L (ref 3.5–5.1)
Sodium: 143 mmol/L (ref 135–145)
TOTAL PROTEIN: 5.3 g/dL — AB (ref 6.5–8.1)

## 2017-08-14 LAB — PHOSPHORUS
PHOSPHORUS: 4.3 mg/dL (ref 2.5–4.6)
Phosphorus: 3.6 mg/dL (ref 2.5–4.6)

## 2017-08-14 LAB — POTASSIUM: Potassium: 3.4 mmol/L — ABNORMAL LOW (ref 3.5–5.1)

## 2017-08-14 LAB — MAGNESIUM
MAGNESIUM: 1.7 mg/dL (ref 1.7–2.4)
Magnesium: 2 mg/dL (ref 1.7–2.4)

## 2017-08-14 SURGERY — DEBRIDEMENT, WOUND, ABDOMEN
Anesthesia: General | Site: Abdomen | Wound class: Dirty or Infected

## 2017-08-14 MED ORDER — MIDAZOLAM HCL 2 MG/2ML IJ SOLN
INTRAMUSCULAR | Status: AC
  Filled 2017-08-14: qty 2

## 2017-08-14 MED ORDER — MAGNESIUM SULFATE 2 GM/50ML IV SOLN
2.0000 g | Freq: Once | INTRAVENOUS | Status: AC
  Administered 2017-08-14: 2 g via INTRAVENOUS
  Filled 2017-08-14: qty 50

## 2017-08-14 MED ORDER — VASOPRESSIN 20 UNIT/ML IV SOLN
INTRAVENOUS | Status: DC | PRN
  Administered 2017-08-14: 2 [IU] via INTRAVENOUS

## 2017-08-14 MED ORDER — INSULIN GLARGINE 100 UNIT/ML ~~LOC~~ SOLN
15.0000 [IU] | Freq: Every day | SUBCUTANEOUS | Status: DC
  Filled 2017-08-14: qty 0.15

## 2017-08-14 MED ORDER — FAT EMULSION PLANT BASED 20 % IV EMUL
500.0000 mL | INTRAVENOUS | Status: DC
  Administered 2017-08-14 (×2): 500 mL via INTRAVENOUS
  Filled 2017-08-14: qty 500

## 2017-08-14 MED ORDER — FENTANYL CITRATE (PF) 100 MCG/2ML IJ SOLN
INTRAMUSCULAR | Status: DC | PRN
  Administered 2017-08-14: 100 ug via INTRAVENOUS

## 2017-08-14 MED ORDER — SUCCINYLCHOLINE CHLORIDE 20 MG/ML IJ SOLN
INTRAMUSCULAR | Status: DC | PRN
  Administered 2017-08-14: 100 mg via INTRAVENOUS

## 2017-08-14 MED ORDER — LACTATED RINGERS IV SOLN
INTRAVENOUS | Status: DC | PRN
  Administered 2017-08-14: 21:00:00 via INTRAVENOUS

## 2017-08-14 MED ORDER — ROCURONIUM BROMIDE 100 MG/10ML IV SOLN
INTRAVENOUS | Status: DC | PRN
  Administered 2017-08-14: 10 mg via INTRAVENOUS
  Administered 2017-08-14: 40 mg via INTRAVENOUS

## 2017-08-14 MED ORDER — TRACE MINERALS CR-CU-MN-SE-ZN 10-1000-500-60 MCG/ML IV SOLN
INTRAVENOUS | Status: DC
  Filled 2017-08-14: qty 1992

## 2017-08-14 MED ORDER — SUCCINYLCHOLINE CHLORIDE 20 MG/ML IJ SOLN
INTRAMUSCULAR | Status: AC
  Filled 2017-08-14: qty 1

## 2017-08-14 MED ORDER — 0.9 % SODIUM CHLORIDE (POUR BTL) OPTIME
TOPICAL | Status: DC | PRN
  Administered 2017-08-14: 700 mL

## 2017-08-14 MED ORDER — PROPOFOL 10 MG/ML IV BOLUS
INTRAVENOUS | Status: DC | PRN
  Administered 2017-08-14: 200 mg via INTRAVENOUS

## 2017-08-14 MED ORDER — DEXAMETHASONE SODIUM PHOSPHATE 10 MG/ML IJ SOLN
INTRAMUSCULAR | Status: AC
  Filled 2017-08-14: qty 1

## 2017-08-14 MED ORDER — HYDRALAZINE HCL 20 MG/ML IJ SOLN
10.0000 mg | INTRAMUSCULAR | Status: DC | PRN
  Administered 2017-08-14: 20 mg via INTRAVENOUS
  Filled 2017-08-14: qty 1

## 2017-08-14 MED ORDER — FENTANYL CITRATE (PF) 100 MCG/2ML IJ SOLN
INTRAMUSCULAR | Status: AC
  Filled 2017-08-14: qty 2

## 2017-08-14 MED ORDER — INSULIN GLARGINE 100 UNIT/ML ~~LOC~~ SOLN
10.0000 [IU] | Freq: Every day | SUBCUTANEOUS | Status: DC
  Administered 2017-08-14 – 2017-08-20 (×7): 10 [IU] via SUBCUTANEOUS
  Filled 2017-08-14 (×8): qty 0.1

## 2017-08-14 MED ORDER — LIDOCAINE HCL (CARDIAC) 20 MG/ML IV SOLN
INTRAVENOUS | Status: DC | PRN
  Administered 2017-08-14: 100 mg via INTRAVENOUS

## 2017-08-14 MED ORDER — INSULIN ASPART 100 UNIT/ML ~~LOC~~ SOLN: 0.0000 [IU] | SUBCUTANEOUS | Status: DC

## 2017-08-14 MED ORDER — PHENYLEPHRINE HCL 10 MG/ML IJ SOLN
INTRAMUSCULAR | Status: DC | PRN
  Administered 2017-08-14: 100 ug via INTRAVENOUS
  Administered 2017-08-14 (×2): 200 ug via INTRAVENOUS
  Administered 2017-08-14: 100 ug via INTRAVENOUS
  Administered 2017-08-14 (×2): 200 ug via INTRAVENOUS

## 2017-08-14 MED ORDER — ROCURONIUM BROMIDE 50 MG/5ML IV SOLN
INTRAVENOUS | Status: AC
  Filled 2017-08-14: qty 1

## 2017-08-14 MED ORDER — INSULIN ASPART 100 UNIT/ML ~~LOC~~ SOLN
0.0000 [IU] | SUBCUTANEOUS | Status: DC
  Administered 2017-08-14: 3 [IU] via SUBCUTANEOUS
  Administered 2017-08-14 – 2017-08-15 (×5): 2 [IU] via SUBCUTANEOUS
  Filled 2017-08-14 (×6): qty 1

## 2017-08-14 MED ORDER — FENTANYL 2500MCG IN NS 250ML (10MCG/ML) PREMIX INFUSION
0.0000 ug/h | INTRAVENOUS | Status: DC
  Administered 2017-08-14: 100 ug/h via INTRAVENOUS
  Administered 2017-08-15 – 2017-08-16 (×4): 200 ug/h via INTRAVENOUS
  Administered 2017-08-17 (×2): 250 ug/h via INTRAVENOUS
  Administered 2017-08-18: 275 ug/h via INTRAVENOUS
  Administered 2017-08-18 (×2): 250 ug/h via INTRAVENOUS
  Administered 2017-08-19: 325 ug/h via INTRAVENOUS
  Administered 2017-08-19: 350 ug/h via INTRAVENOUS
  Administered 2017-08-19: 275 ug/h via INTRAVENOUS
  Administered 2017-08-20 (×2): 350 ug/h via INTRAVENOUS
  Filled 2017-08-14 (×15): qty 250

## 2017-08-14 MED ORDER — PROPOFOL 1000 MG/100ML IV EMUL
0.0000 ug/kg/min | INTRAVENOUS | Status: DC
  Administered 2017-08-14: 20 ug/kg/min via INTRAVENOUS
  Administered 2017-08-15 – 2017-08-16 (×9): 40 ug/kg/min via INTRAVENOUS
  Administered 2017-08-17: 25 ug/kg/min via INTRAVENOUS
  Administered 2017-08-17 (×3): 40 ug/kg/min via INTRAVENOUS
  Administered 2017-08-17 – 2017-08-18 (×2): 25 ug/kg/min via INTRAVENOUS
  Administered 2017-08-18: 20 ug/kg/min via INTRAVENOUS
  Administered 2017-08-18: 25 ug/kg/min via INTRAVENOUS
  Filled 2017-08-14 (×21): qty 100

## 2017-08-14 MED ORDER — ONDANSETRON HCL 4 MG/2ML IJ SOLN
INTRAMUSCULAR | Status: AC
  Filled 2017-08-14: qty 2

## 2017-08-14 MED ORDER — POTASSIUM CHLORIDE 10 MEQ/50ML IV SOLN
10.0000 meq | INTRAVENOUS | Status: DC
  Administered 2017-08-14: 10 meq via INTRAVENOUS
  Filled 2017-08-14 (×3): qty 50

## 2017-08-14 MED ORDER — PROPOFOL 10 MG/ML IV BOLUS
INTRAVENOUS | Status: AC
  Filled 2017-08-14: qty 20

## 2017-08-14 MED ORDER — FENTANYL CITRATE (PF) 100 MCG/2ML IJ SOLN
50.0000 ug | Freq: Once | INTRAMUSCULAR | Status: AC
  Administered 2017-08-14: 50 ug via INTRAVENOUS

## 2017-08-14 MED ORDER — PIPERACILLIN-TAZOBACTAM 3.375 G IVPB
3.3750 g | Freq: Three times a day (TID) | INTRAVENOUS | Status: DC
Start: 2017-08-14 — End: 2017-08-18
  Administered 2017-08-14: 3.375 g via INTRAVENOUS
  Administered 2017-08-14: 3.375 mg via INTRAVENOUS
  Administered 2017-08-15 – 2017-08-18 (×11): 3.375 g via INTRAVENOUS
  Filled 2017-08-14 (×13): qty 50

## 2017-08-14 MED ORDER — MIDAZOLAM HCL 2 MG/2ML IJ SOLN
INTRAMUSCULAR | Status: DC | PRN
  Administered 2017-08-14: 2 mg via INTRAVENOUS

## 2017-08-14 MED ORDER — FENTANYL CITRATE (PF) 100 MCG/2ML IJ SOLN: 25.0000 ug | INTRAMUSCULAR | Status: DC | PRN

## 2017-08-14 MED ORDER — TRACE MINERALS CR-CU-MN-SE-ZN 10-1000-500-60 MCG/ML IV SOLN
INTRAVENOUS | Status: DC
  Administered 2017-08-14: 19:00:00 via INTRAVENOUS
  Filled 2017-08-14: qty 1992

## 2017-08-14 MED ORDER — FENTANYL BOLUS VIA INFUSION
50.0000 ug | INTRAVENOUS | Status: DC | PRN
  Administered 2017-08-19 – 2017-08-20 (×3): 50 ug via INTRAVENOUS
  Filled 2017-08-14: qty 50

## 2017-08-14 MED ORDER — ONDANSETRON HCL 4 MG/2ML IJ SOLN: 4.0000 mg | Freq: Once | INTRAMUSCULAR | Status: DC | PRN

## 2017-08-14 MED ORDER — SODIUM CHLORIDE 0.9 % IV SOLN: 25.0000 ug/h | INTRAVENOUS | Status: DC

## 2017-08-14 MED ORDER — LIDOCAINE HCL (PF) 2 % IJ SOLN
INTRAMUSCULAR | Status: AC
  Filled 2017-08-14: qty 10

## 2017-08-14 MED ORDER — POTASSIUM CHLORIDE 10 MEQ/50ML IV SOLN
10.0000 meq | INTRAVENOUS | Status: AC
  Administered 2017-08-14 (×5): 10 meq via INTRAVENOUS
  Filled 2017-08-14 (×5): qty 50

## 2017-08-14 SURGICAL SUPPLY — 25 items
CANISTER SUCT 1200ML W/VALVE (MISCELLANEOUS) ×3 IMPLANT
DRAPE LAPAROTOMY 100X77 ABD (DRAPES) ×3 IMPLANT
DRAPE WOUND VAC 10X15X1CM (MISCELLANEOUS) ×12 IMPLANT
DRSG VAC ATS MED SENSATRAC (GAUZE/BANDAGES/DRESSINGS) ×3 IMPLANT
DRSG VERSA FOAM LRG 10X15 (GAUZE/BANDAGES/DRESSINGS) ×3 IMPLANT
ELECT CAUTERY NEEDLE TIP 1.0 (MISCELLANEOUS) ×3
ELECT REM PT RETURN 9FT ADLT (ELECTROSURGICAL) ×3
ELECTRODE CAUTERY NEDL TIP 1.0 (MISCELLANEOUS) ×1 IMPLANT
ELECTRODE REM PT RTRN 9FT ADLT (ELECTROSURGICAL) ×1 IMPLANT
GAUZE SPONGE 4X4 12PLY STRL (GAUZE/BANDAGES/DRESSINGS) IMPLANT
GLOVE BIO SURGEON STRL SZ8 (GLOVE) ×12 IMPLANT
GOWN STRL REUS W/ TWL LRG LVL3 (GOWN DISPOSABLE) ×2 IMPLANT
GOWN STRL REUS W/TWL LRG LVL3 (GOWN DISPOSABLE) ×4
JACKSON PRATT 10 (INSTRUMENTS) ×6 IMPLANT
KIT TURNOVER KIT A (KITS) ×3 IMPLANT
LABEL OR SOLS (LABEL) ×3 IMPLANT
NS IRRIG 1000ML POUR BTL (IV SOLUTION) ×3 IMPLANT
PACK BASIN MAJOR ARMC (MISCELLANEOUS) ×3 IMPLANT
PAD ABD DERMACEA PRESS 5X9 (GAUZE/BANDAGES/DRESSINGS) ×3 IMPLANT
SPONGE VERSALON 4X4 4PLY (MISCELLANEOUS) ×15 IMPLANT
STAPLER SKIN PROX 35W (STAPLE) ×3 IMPLANT
SUT CHROMIC 0 CT 1 (SUTURE) ×3 IMPLANT
SUT CHROMIC BR 1/2CLE 2-0 54IN (SUTURE) ×3 IMPLANT
SUT MAXON ABS #0 GS21 30IN (SUTURE) ×12 IMPLANT
TRAY FOLEY W/METER SILVER 16FR (SET/KITS/TRAYS/PACK) IMPLANT

## 2017-08-14 NOTE — Anesthesia Postprocedure Evaluation (Signed)
Anesthesia Post Note  Patient: Cory Graham  Procedure(s) Performed: DEBRIDEMENT ABDOMINAL WOUND (N/A Abdomen) APPLICATION OF WOUND VAC (N/A Abdomen)  Patient location during evaluation: ICU Anesthesia Type: General Level of consciousness: patient remains intubated per anesthesia plan, obtunded/minimal responses and sedated Pain management: pain level controlled Vital Signs Assessment: post-procedure vital signs reviewed and stable Respiratory status: patient remains intubated per anesthesia plan Cardiovascular status: stable Anesthetic complications: no     Last Vitals:  Vitals:   08/26/2017 1800 08/05/2017 1900  BP: 135/68   Pulse: 100   Resp: (!) 31   Temp:  36.7 C  SpO2: 97%     Last Pain:  Vitals:   08/18/2017 1900  TempSrc: Oral  PainSc:                  KEPHART,WILLIAM K

## 2017-08-14 NOTE — Progress Notes (Signed)
Pharmacy Antibiotic Note  Cory Graham is a 57 y.o. male admitted on 08/31/2017 with bowel perforation s/p exploratory laparotomy. Patient has been receiving Zosyn. Patient initially ordered anidulafungin. After discussion with Dr. Sampson GoonFitzgerald on 4/4, patient transition from anidulafungin to fluconazole.   Plan: Fluconazole to 400mg  IV Q24hr.  Zosyn EI 3.375g IV Q8hr.   Height: 5\' 9"  (175.3 cm) Weight: 209 lb 7 oz (95 kg) IBW/kg (Calculated) : 70.7  Temp (24hrs), Avg:99.5 F (37.5 C), Min:99.3 F (37.4 C), Max:99.7 F (37.6 C)  Recent Labs  Lab 08/09/17 0216 08/07/2017 0451 08/11/17 0516 08/12/17 0459 08/13/17 0509 2018-03-22 0620 2018-03-22 0922  WBC 13.2* 10.5 15.5* 9.8 8.7  --   --   CREATININE 1.32* 1.07 1.06 1.00 0.94 0.94 1.02  LATICACIDVEN  --   --  1.5  --   --   --   --     Estimated Creatinine Clearance: 88.7 mL/min (by C-G formula based on SCr of 1.02 mg/dL).    No Known Allergies  Antimicrobials this admission: Zosyn 4/1 >>  vancomycin 4/4 >> 4/4 anidulafungin 4/4 x 1 Fluconazole 4/4 >>  Dose adjustments this admission: 4/6 Zosyn transitioned to 4.5g IV Q8hr.  4/6 Fluconazole transitioned to 400mg  IV Q24hr.  4/11 Zosyn transitioned to 3.375g IV Q8hr.   Microbiology results: 4/3 BCx: no growth x 5 days  4/5 BCx (only 1 set): no growth x 5 days  4/7 BCx: no growth x 4 day  4/2 Wound Cx: multiple organisms 4/3 UCx: no growth  4/7 UCx: no growth  4/7 Tracheal Aspirate: few candida albicans  4/2 MRSA PCR: negative  Thank you for allowing pharmacy to be a part of this patient's care.  Simpson,Michael L 2017/10/26 11:46 AM

## 2017-08-14 NOTE — Anesthesia Preprocedure Evaluation (Addendum)
Anesthesia Evaluation  Patient identified by MRN, date of birth, ID band Patient confused  General Assessment Comment:Limited hx obtained from wife, pt not responsive to questions or commands  Reviewed: Allergy & Precautions, NPO status , Patient's Chart, lab work & pertinent test results  History of Anesthesia Complications Negative for: history of anesthetic complications  Airway Mallampati: III  TM Distance: >3 FB    Comment: Unable to assess, pt not responsive to commands Dental  (+) Poor Dentition, Missing   Pulmonary neg pulmonary ROS, neg sleep apnea, neg COPD,    - rhonchi + wheezing      Cardiovascular (-) hypertension(-) CAD, (-) Past MI, (-) Cardiac Stents and (-) CABG  Rhythm:Regular Rate:Normal - Systolic murmurs and - Diastolic murmurs    Neuro/Psych negative neurological ROS  negative psych ROS   GI/Hepatic negative GI ROS, Neg liver ROS,   Endo/Other  negative endocrine ROSneg diabetes  Renal/GU negative Renal ROS     Musculoskeletal negative musculoskeletal ROS (+)   Abdominal   Peds  Hematology negative hematology ROS (+)   Anesthesia Other Findings No known medical hx, has not seen a physician in >20 yrs  Reproductive/Obstetrics                             Anesthesia Physical  Anesthesia Plan  ASA: II and emergent  Anesthesia Plan: General   Post-op Pain Management:    Induction: Intravenous  PONV Risk Score and Plan: 1 and Ondansetron  Airway Management Planned: Oral ETT  Additional Equipment:   Intra-op Plan:   Post-operative Plan: Post-operative intubation/ventilation  Informed Consent: I have reviewed the patients History and Physical, chart, labs and discussed the procedure including the risks, benefits and alternatives for the proposed anesthesia with the patient or authorized representative who has indicated his/her understanding and acceptance.    Dental advisory given  Plan Discussed with: CRNA and Anesthesiologist  Anesthesia Plan Comments: (Plan to remain intubated and transferred to ICU post op)       Anesthesia Quick Evaluation

## 2017-08-14 NOTE — Transfer of Care (Signed)
Immediate Anesthesia Transfer of Care Note  Patient: Cory Graham Si  Procedure(s) Performed: DEBRIDEMENT ABDOMINAL WOUND (N/A Abdomen) APPLICATION OF WOUND VAC (N/A Abdomen)  Patient Location: ICU  Anesthesia Type:General  Level of Consciousness: sedated and Patient remains intubated per anesthesia plan  Airway & Oxygen Therapy: Patient remains intubated per anesthesia plan and Patient placed on Ventilator (see vital sign flow sheet for setting)  Post-op Assessment: Post -op Vital signs reviewed and stable  Post vital signs: stable  Last Vitals:  Vitals Value Taken Time  BP    Temp    Pulse    Resp    SpO2      Last Pain:  Vitals:   08/16/2017 1900  TempSrc: Oral  PainSc:          Complications: No apparent anesthesia complications

## 2017-08-14 NOTE — Consult Note (Addendum)
WOC Nurse Follow up Note for Wound and Ostomy  POD 4 laparotomy with diverting loop ileostomy POD 9 exploratory laparotomy  WOC Nurse ostomy follow up Stoma type/location: LLQ ileostomy Stomal assessment/size: 1 and 1/2 inches x 2 and 1/8 inches, slightly budded but in a deep crease. 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  Treatment options for stomal/peristomal skin: two sutures of stoma bridge ends are removed to clean around bridge and relieve pressure. Two intact sutures remain. Three skin barrier rings fashioned around ostomy and bridge prior to pouching. Output: small amount of dark brown liquid effluent. Ostomy pouching: 2pc., 4-inch pouching system with 3 skin barrier rings applied around stoma and over plastic stoma bridge to assist in the obtaining of seal. Education provided: None Enrolled patient in [employer] Discharge program: No   WOC Nurse wound consult note Reason for Consult: 30cm midline incision with 2 supraumbilical retention sutures and 2 infraumbilical retention sutures.  Widely spaced staples. Proximal end of incision (between staples 1 and 2) is draining copious feculent material, tan color with foul odor. Depth of opening at that place is 5cm. Wound type:Surgical Pressure Injury POA: NA Measurement: 30cm x 0.4cm with open area at proximal end measuring 5cm. Wound ZOX:WRUEAVbed:Unable to visualize. Drainage (amount, consistency, odor) As noted above.  Tan, copious, feculent material Periwound: Numerous areas of medical adhesive related skin injury (MARSI) on bilateral abdomen from incisional NPWT dressing and reinforcements.  Largest MARSI measures 2cm x 3.5cm x 0.1cm. All have pink, moist wound beds and scant amounts of serous exudate. All MARSI wounds are covered with thin film transparent dressings. Dressing  procedure/placement/frequency: Incision is cleansed and dried and dressed with dry folded gauze dressings, ABD and paper tape.  As noted above, areas of MARSI are covered with thin film dressings.  See ostomy note above for LLQ ileostomy.  WOC nursing team will follow, and will remain available to this patient, the nursing, surgical and medical teams.   Thanks, Ladona MowLaurie Apolonio Cutting, MSN, RN, GNP, Hans EdenCWOCN, CWON-AP, FAAN  Pager# [phone]

## 2017-08-14 NOTE — Progress Notes (Signed)
Discussed with Dr. Everlene FarrierPabon.  Agree that the planned procedure of irrigation and debridement is warranted.  This is discussed with the patient as well as with Dr. Sung AmabileSimonds. Plan for procedure later tonight when OR becomes available.  Risks and options reviewed with the patient

## 2017-08-14 NOTE — Progress Notes (Addendum)
PHARMACY - ADULT TOTAL PARENTERAL NUTRITION CONSULT NOTE   Pharmacy Consult for TPN Management  Indication: Bowel perfortation/NPO   Patient Measurements: Height: 5\' 9"  (175.3 cm) Weight: 209 lb 7 oz (95 kg) IBW/kg (Calculated) : 70.7 TPN AdjBW (KG): 76.9 Body mass index is 30.93 kg/m.  Assessment:  Pharmacy consulted for TPN management for 57 yo male started on TPN. Today is day 7 without nutrition. Patient had umbilical hernia repair on 4/2 and had diverting loop ileostomy on 4/7. Patient returned to OR on 4/11 and is now re-intubated and is sedated on propofol and fentanyl.   TPN Access: PICC (placed 4/10)  TPN start date: 4/8    Current Nutrition: N/A  Plan:  Phosphorus is elevated and sodium is high end normal. Will order Clinimix 5/15 without electrolytes. Will order potassium 30mEq IV x 1 and magnesium 2g IV x 1. Per conversation with dietary, will hold lipids while on propofol. If propofol is stopped, can resume lipids at 1225mL/hr.    Continue Clinimix 5/15 witout electrolytes @ 1783mL/hr.  Continue Lantus 10 units daily and SSI Q6hr. - Patient used 6 units of SSI in previous 24 hours.  Will obtains labs per protocol.  Protonix IV Daily.   Pharmacy will continue to monitor and adjust per consult.   Lennette Fader L 09/02/2017,3:40 PM

## 2017-08-14 NOTE — Op Note (Signed)
08/13/2017  10:38 PM  PATIENT:  Cory Graham  57 y.o. male  PRE-OPERATIVE DIAGNOSIS: Wound sepsis  POST-OPERATIVE DIAGNOSIS: Wound sepsis, intra-abdominal abscesses  PROCEDURE: Examination under anesthesia debridement of subcutaneous tissue and fascia of the abdominal wall wound, placement of left upper quadrant right upper quadrant drains  SURGEON:  Lattie Hawichard E Martavius Lusty MD, FACS  ANESTHESIA:   General with endotracheal tube  Details of Procedure: This a patient with obvious wound sepsis following 2 operations for perforated bowel.  He has foul-smelling odor and necrotic tissue in his upper abdominal wall wound.  Preoperatively we discussed rationale for offering surgery and I was in full agreement with Dr. Hurman HornPabon's plans.  I reviewed the options rationale with the patient preoperatively.  Description of procedure informed consent been obtained he was induced to general anesthesia the old dressings and wound VAC were removed.  A surgical pause was then held.  Inspection of the wound demonstrated foul-smelling cloudy fluid mostly straw-colored.  Cultures were obtained superficially.  Further inspection demonstrated considerable adherence and adhesions of what appeared to be small bowel loops although it could not easily be identified is either small bowel stomach or colon.  Anatomy cannot be identified.  Sutures that were loose in the wound were removed and sharp debridement using scissors of the abdominal wall fascia and subtenons tissue was performed this was sent off for examination with the cut sutures.  The area was irrigated with copious amounts normal saline.  Further inspection and digital probing of the wound demonstrated an open space that extended to the splenic area laterally.  A suction catheter was placed and approximately 200 cc of cloudy fluid was aspirated.  There were no sign of vegetable material or bile staining in the any of this fluid.  Further irrigation of this area was  performed.  Attention was turned to the right side where a similar cavity over towards the liver was entered and a small amount of clear fluid was removed.  Again this was also foul-smelling but did not have particulate matter to suggest that it was enteric contents nor did it have bile staining.  With these 2 undrained collections laterally on the right and left under direct vision a 10 mm JP drain was placed on each side one placed up into the left upper quadrant and one into the right upper quadrant.  These were held in with 3-0 nylon.  The sponge lap needle count was correct at this time.  The small wound was managed with placement of a tailored white foam over the bowel or internal organs.  A black foam was then placed over the white foam and a standardly placed wound VAC was placed.  This was tailored around the retention sutures and reinforced to ensure no leakage.  The newly placed right upper quadrant left upper quadrant drains were brought down laterally through the wound VAC cover.  These were placed to bulb suction and the wound VAC was applied to suction as well there was no leakage.  A new ostomy appliance was placed.  There was considerable retraction around [place] bridge at the ileostomy.  This suggests that future revision of the ileostomy will be necessary.  At the end of the procedure the wound was closed with a wound VAC without leak.  A new ostomy bag was placed to cultures have been obtained and the sponge lap needle count was correct.  Patient was taken to the ICU in stable condition on the ventilator with a new nasogastric tube  placed as well.  No complications.   Lattie Haw, MD FACS

## 2017-08-14 NOTE — Anesthesia Procedure Notes (Signed)
Procedure Name: Intubation Date/Time: 13-Mar-2018 9:33 PM Performed by: Irving BurtonBachich, Noe Goyer, CRNA Pre-anesthesia Checklist: Patient identified, Emergency Drugs available, Suction available and Patient being monitored Patient Re-evaluated:Patient Re-evaluated prior to induction Oxygen Delivery Method: Circle system utilized Preoxygenation: Pre-oxygenation with 100% oxygen Induction Type: IV induction Ventilation: Mask ventilation without difficulty Laryngoscope Size: McGraph and 4 Grade View: Grade I Tube type: Oral Tube size: 7.5 mm Number of attempts: 2 Airway Equipment and Method: Video-laryngoscopy and Stylet Placement Confirmation: ETT inserted through vocal cords under direct vision,  positive ETCO2 and breath sounds checked- equal and bilateral Secured at: 21 cm Tube secured with: Tape Dental Injury: Teeth and Oropharynx as per pre-operative assessment  Difficulty Due To: Difficult Airway- due to dentition

## 2017-08-14 NOTE — Progress Notes (Signed)
PT Cancellation Note  Patient Details Name: Cory Graham MRN: [id number] DOB: [birth year]   Cancelled Treatment:    Reason Eval/Treat Not Completed: Patient not medically ready   Upon arrival, pt had finished care with nursing staff.  Pt with drainage from incision and requested hold at this time.  Will continue as appropriate.   Danielle DessSarah Dianelly Ferran 08/28/2017, 10:30 AM

## 2017-08-14 NOTE — Progress Notes (Signed)
Seen and examined . VAC removed Copious enteric drainage Staples  Removed Wound seen and there is necrotic fascia w unraveled PDS. I can see some bowel . D/W w wound nurse and we will place a temporizing negative pressure.  We will explore today and do abd washout.  D/W the family in detail and intensivist. Will likely require debridement abd washout and Wound vac. Likely will require multiple abd washout.

## 2017-08-14 NOTE — Progress Notes (Signed)
Dr Henrene HawkingKephart called to request a recheck for patient's potassium last result showed 3, RN notified MD that patient is currently receiving potassium replacement bag x5, will not be able to recheck for an accurate result until 1 hour after last bag is complete. MD okayed, "please recheck it then"

## 2017-08-14 NOTE — Progress Notes (Signed)
Continues to tolerate extubation well.  However, he has delirium with intermittent agitation.  Also, now with feculent drainage from abdominal surgical wound.  Plan is for him to return to OR tonight for reexploration.  He will be left intubated after that procedure.   Vitals:   09/01/17 1500 09/01/17 1600 09/01/17 1700 09/01/17 1800  BP: (!) 124/58 116/67 129/66 135/68  Pulse: (!) 103 (!) 103 (!) 102 100  Resp: (!) 24 (!) 22 (!) 22 (!) 31  Temp:      TempSrc:      SpO2: 96% 96% 97% 97%  Weight:      Height:      RA  Gen: No overt respiratory distress, poorly oriented HEENT: NCAT, sclerae white Neck: No JVD Lungs: No adventitious sounds anteriorly Cardiovascular: Regular, no M Abdomen: Distended, firm, absent BS Ext: No C/C/E Neuro: Except for cognitive dysfunction, no deficits Skin: No lesions noted  BMP Latest Ref Rng & Units 02-Feb-2018 02-Feb-2018 02-Feb-2018  Glucose 65 - 99 mg/dL - 161(W145(H) 960(A395(H)  BUN 6 - 20 mg/dL - 17 15  Creatinine 5.400.61 - 1.24 mg/dL - 9.811.02 1.910.94  Sodium 478135 - 145 mmol/L - 146(H) 143  Potassium 3.5 - 5.1 mmol/L 3.4(L) 3.0(L) 3.5  Chloride 101 - 111 mmol/L - 113(H) 111  CO2 22 - 32 mmol/L - 29 28  Calcium 8.9 - 10.3 mg/dL - 7.1(L) 7.0(L)    CBC Latest Ref Rng & Units 08/13/2017 08/12/2017 08/11/2017  WBC 3.8 - 10.6 K/uL 8.7 9.8 15.5(H)  Hemoglobin 13.0 - 18.0 g/dL 7.9(L) 7.4(L) 9.3(L)  Hematocrit 40.0 - 52.0 % 24.5(L) 22.2(L) 29.2(L)  Platelets 150 - 440 K/uL 502(H) 399 344    CXR: No new film  IMPRESSION: Severe sepsis Peritonitis Occult bowel perforation Delirium due to critical illness On TPN Deconditioning  PLAN/REC: Continue Zosyn, fluconazole Continue TPN Critical care medicine will assist in management of ventilator postoperatively    Billy Fischeravid Vincent Ehrler, MD PCCM service Mobile [phone] Pager [phone] 02-Feb-2018 6:45 PM

## 2017-08-14 NOTE — Progress Notes (Signed)
OT Cancellation Note  Patient Details Name: Cory Graham MRN: [id number] DOB: [birth year]   Cancelled Treatment:    Reason Eval/Treat Not Completed: Medical issues which prohibited therapy. Order received, chart reviewed. Spoke with PTA, RN requesting therapy hold at this time due to pt with drainage from incision. Will re-attempt OT evaluation at later date/time as pt is medically appropriate.   Richrd PrimeJamie Stiller, MPH, MS, OTR/L ascom [phone] 08/07/2017, 10:37 AM

## 2017-08-14 NOTE — Progress Notes (Signed)
RN notified Dr Henrene HawkingKephart that patient's last bag of potassium is currently infusing, will not be able to do recheck until 1840 which is an hour after replacement complete.  Dr Henrene HawkingKephart states to "go ahead and draw potassium recheck at 1740 and see where we are at"

## 2017-08-14 NOTE — Anesthesia Post-op Follow-up Note (Signed)
Anesthesia QCDR form completed.        

## 2017-08-14 NOTE — Consult Note (Addendum)
WOC Nurse wound follow up: Seen with Dr. Everlene FarrierPabon at bedside Wound type:Midline surgical, proximal aspect opened by Dr. Everlene FarrierPabon at bedside. Staples removed (5) and gentle, blunt finger dissection performed. Opened to first supraumbilical retention suture. Measurement: 10cm x 4cm x 5cm (proximal portion of wound defect) Wound LKG:[id number]:Necrotic (90%) in wound bed, pink at wound edge nearest skin level. Drainage (amount, consistency, odor) thin, tan, feculent Periwound: Intact Dressing procedure/placement/frequency: Periwound protected with skin barrier ring, Mepitel placed in wound bed. Defect filled with foam, covered with drape and attached to negative pressure at 125mmHg, continuous as a temporary means of evaluating, quantifying and evacuating wound exudate until surgery this pm.  Seal achieved immediately.  WOC nursing team will follow, and will remain available to this patient, the nursing, surgical and medical teams. Thanks, Ladona MowLaurie Ivannia Willhelm, MSN, RN, GNP, Hans EdenCWOCN, CWON-AP, FAAN  Pager# [phone]

## 2017-08-15 ENCOUNTER — Encounter: Payer: Self-pay | Admitting: Surgery

## 2017-08-15 DIAGNOSIS — K659 Peritonitis, unspecified: Secondary | ICD-10-CM

## 2017-08-15 DIAGNOSIS — N179 Acute kidney failure, unspecified: Secondary | ICD-10-CM

## 2017-08-15 DIAGNOSIS — E861 Hypovolemia: Secondary | ICD-10-CM

## 2017-08-15 LAB — CULTURE, BLOOD (ROUTINE X 2)
Culture: NO GROWTH
Culture: NO GROWTH

## 2017-08-15 LAB — GLUCOSE, CAPILLARY
GLUCOSE-CAPILLARY: 133 mg/dL — AB (ref 65–99)
Glucose-Capillary: 129 mg/dL — ABNORMAL HIGH (ref 65–99)
Glucose-Capillary: 134 mg/dL — ABNORMAL HIGH (ref 65–99)
Glucose-Capillary: 112 mg/dL — ABNORMAL HIGH (ref 65–99)
Glucose-Capillary: 132 mg/dL — ABNORMAL HIGH (ref 65–99)
Glucose-Capillary: 121 mg/dL — ABNORMAL HIGH (ref 65–99)

## 2017-08-15 LAB — CBC WITH DIFFERENTIAL/PLATELET
BASOS ABS: 0.1 10*3/uL (ref 0–0.1)
BASOS PCT: 1 %
Basophils Absolute: 0.1 10*3/uL (ref 0–0.1)
Basophils Relative: 1 %
Eosinophils Absolute: 0.1 10*3/uL (ref 0–0.7)
Eosinophils Absolute: 0.2 10*3/uL (ref 0–0.7)
Eosinophils Relative: 1 %
Eosinophils Relative: 2 %
HEMATOCRIT: 20 % — AB (ref 40.0–52.0)
HEMATOCRIT: 24.7 % — AB (ref 40.0–52.0)
HEMOGLOBIN: 6.4 g/dL — AB (ref 13.0–18.0)
HEMOGLOBIN: 8.2 g/dL — AB (ref 13.0–18.0)
LYMPHS ABS: 1.1 10*3/uL (ref 1.0–3.6)
LYMPHS PCT: 12 %
Lymphocytes Relative: 14 %
Lymphs Abs: 0.7 10*3/uL — ABNORMAL LOW (ref 1.0–3.6)
MCH: 24.8 pg — ABNORMAL LOW (ref 26.0–34.0)
MCH: 26.3 pg (ref 26.0–34.0)
MCHC: 32.1 g/dL (ref 32.0–36.0)
MCHC: 33.2 g/dL (ref 32.0–36.0)
MCV: 77.4 fL — AB (ref 80.0–100.0)
MCV: 79.1 fL — ABNORMAL LOW (ref 80.0–100.0)
MONO ABS: 0.8 10*3/uL (ref 0.2–1.0)
MONOS PCT: 10 %
Monocytes Absolute: 0.8 10*3/uL (ref 0.2–1.0)
Monocytes Relative: 13 %
NEUTROS ABS: 5.7 10*3/uL (ref 1.4–6.5)
NEUTROS PCT: 73 %
NEUTROS PCT: 73 %
Neutro Abs: 4.3 10*3/uL (ref 1.4–6.5)
Platelets: 397 10*3/uL (ref 150–440)
Platelets: 453 10*3/uL — ABNORMAL HIGH (ref 150–440)
RBC: 2.58 MIL/uL — ABNORMAL LOW (ref 4.40–5.90)
RBC: 3.13 MIL/uL — ABNORMAL LOW (ref 4.40–5.90)
RDW: 18.8 % — ABNORMAL HIGH (ref 11.5–14.5)
RDW: 18.9 % — AB (ref 11.5–14.5)
WBC: 5.9 10*3/uL (ref 3.8–10.6)
WBC: 7.9 10*3/uL (ref 3.8–10.6)

## 2017-08-15 LAB — TRIGLYCERIDES: TRIGLYCERIDES: 123 mg/dL (ref <150)

## 2017-08-15 LAB — BASIC METABOLIC PANEL
Anion gap: 4 — ABNORMAL LOW (ref 5–15)
BUN: 34 mg/dL — ABNORMAL HIGH (ref 6–20)
CHLORIDE: 113 mmol/L — AB (ref 101–111)
CO2: 27 mmol/L (ref 22–32)
CREATININE: 1.78 mg/dL — AB (ref 0.61–1.24)
Calcium: 6.8 mg/dL — ABNORMAL LOW (ref 8.9–10.3)
GFR calc Af Amer: 46 mL/min — ABNORMAL LOW (ref >60)
GFR calc non Af Amer: 40 mL/min — ABNORMAL LOW (ref >60)
Glucose, Bld: 130 mg/dL — ABNORMAL HIGH (ref 65–99)
POTASSIUM: 3.5 mmol/L (ref 3.5–5.1)
SODIUM: 144 mmol/L (ref 135–145)

## 2017-08-15 LAB — PREPARE RBC (CROSSMATCH)

## 2017-08-15 LAB — PHOSPHORUS: Phosphorus: 4.9 mg/dL — ABNORMAL HIGH (ref 2.5–4.6)

## 2017-08-15 LAB — MAGNESIUM: Magnesium: 1.9 mg/dL (ref 1.7–2.4)

## 2017-08-15 MED ORDER — INSULIN ASPART 100 UNIT/ML ~~LOC~~ SOLN
0.0000 [IU] | Freq: Four times a day (QID) | SUBCUTANEOUS | Status: DC
  Administered 2017-08-16 – 2017-08-18 (×9): 2 [IU] via SUBCUTANEOUS
  Administered 2017-08-18: 3 [IU] via SUBCUTANEOUS
  Administered 2017-08-18 – 2017-08-19 (×3): 2 [IU] via SUBCUTANEOUS
  Filled 2017-08-15 (×13): qty 1

## 2017-08-15 MED ORDER — MAGNESIUM SULFATE 2 GM/50ML IV SOLN
2.0000 g | Freq: Once | INTRAVENOUS | Status: AC
  Administered 2017-08-15: 2 g via INTRAVENOUS
  Filled 2017-08-15: qty 50

## 2017-08-15 MED ORDER — FAMOTIDINE IN NACL 20-0.9 MG/50ML-% IV SOLN
20.0000 mg | Freq: Two times a day (BID) | INTRAVENOUS | Status: DC
  Administered 2017-08-15 – 2017-08-19 (×8): 20 mg via INTRAVENOUS
  Filled 2017-08-15 (×9): qty 50

## 2017-08-15 MED ORDER — NOREPINEPHRINE 4 MG/250ML-% IV SOLN
0.0000 ug/min | INTRAVENOUS | Status: DC
  Administered 2017-08-16: 2 ug/min via INTRAVENOUS
  Administered 2017-08-17: 6 ug/min via INTRAVENOUS
  Administered 2017-08-17: 5 ug/min via INTRAVENOUS
  Administered 2017-08-18: 15 ug/min via INTRAVENOUS
  Administered 2017-08-18: 12 ug/min via INTRAVENOUS
  Administered 2017-08-18: 15 ug/min via INTRAVENOUS
  Administered 2017-08-18: 10 ug/min via INTRAVENOUS
  Administered 2017-08-18: 14 ug/min via INTRAVENOUS
  Administered 2017-08-19: 6 ug/min via INTRAVENOUS
  Filled 2017-08-15 (×9): qty 250

## 2017-08-15 MED ORDER — ORAL CARE MOUTH RINSE
15.0000 mL | Freq: Four times a day (QID) | OROMUCOSAL | Status: DC
  Administered 2017-08-15 – 2017-08-16 (×4): 15 mL via OROMUCOSAL

## 2017-08-15 MED ORDER — SODIUM CHLORIDE 0.9 % IV SOLN
Freq: Once | INTRAVENOUS | Status: AC
  Administered 2017-08-17: 08:00:00 via INTRAVENOUS

## 2017-08-15 MED ORDER — TRACE MINERALS CR-CU-MN-SE-ZN 10-1000-500-60 MCG/ML IV SOLN
INTRAVENOUS | Status: AC
  Administered 2017-08-15: 18:00:00 via INTRAVENOUS
  Filled 2017-08-15: qty 1992

## 2017-08-15 MED ORDER — CHLORHEXIDINE GLUCONATE 0.12% ORAL RINSE (MEDLINE KIT)
15.0000 mL | Freq: Two times a day (BID) | OROMUCOSAL | Status: DC
  Administered 2017-08-15 (×2): 15 mL via OROMUCOSAL

## 2017-08-15 MED ORDER — LACTATED RINGERS IV SOLN
Freq: Once | INTRAVENOUS | Status: AC
Start: 2017-08-15 — End: 2017-08-15
  Administered 2017-08-15: 12:00:00 via INTRAVENOUS

## 2017-08-15 MED ORDER — SODIUM CHLORIDE 0.9 % IV BOLUS: 1000.0000 mL | Freq: Once | INTRAVENOUS | Status: DC

## 2017-08-15 MED ORDER — SODIUM CHLORIDE 0.9 % IV SOLN
Freq: Once | INTRAVENOUS | Status: AC
  Administered 2017-08-15: 19:00:00 via INTRAVENOUS
  Filled 2017-08-15: qty 15

## 2017-08-15 MED FILL — Piperacillin Sod-Tazobactam Sod in Dex IV Sol 3-0.375GM/50ML: INTRAVENOUS | Qty: 50 | Status: AC

## 2017-08-15 NOTE — Progress Notes (Signed)
Pharmacy Antibiotic Note  Cory Graham is a 57 y.o. male admitted on 08/26/2017 with bowel perforation s/p exploratory laparotomy. Patient returned to OR on 4/11 and is now requiring mechanical ventilation. Patient has been receiving Zosyn. Patient initially ordered anidulafungin. After discussion with Dr. Sampson GoonFitzgerald on 4/4, patient transition from anidulafungin to fluconazole.   Plan: Fluconazole to 400mg  IV Q24hr.  Zosyn EI 3.375g IV Q8hr.   Height: 5\' 9"  (175.3 cm) Weight: 198 lb 6.6 oz (90 kg) IBW/kg (Calculated) : 70.7  Temp (24hrs), Avg:100.2 F (37.9 C), Min:98.1 F (36.7 C), Max:102.5 F (39.2 C)  Recent Labs  Lab 08/11/17 0516 08/12/17 0459 08/13/17 0509 08/29/2017 0620 08/04/2017 0922 08/15/17 0029 08/15/17 1036 08/15/17 1147  WBC 15.5* 9.8 8.7  --   --  5.9 7.9  --   CREATININE 1.06 1.00 0.94 0.94 1.02  --   --  1.78*  LATICACIDVEN 1.5  --   --   --   --   --   --   --     Estimated Creatinine Clearance: 49.6 mL/min (A) (by C-G formula based on SCr of 1.78 mg/dL (H)).    No Known Allergies  Antimicrobials this admission: Zosyn 4/1 >>  vancomycin 4/4 >> 4/4 anidulafungin 4/4 x 1 Fluconazole 4/4 >>  Dose adjustments this admission: 4/6 Zosyn transitioned to 4.5g IV Q8hr.  4/6 Fluconazole transitioned to 400mg  IV Q24hr.  4/11 Zosyn transitioned to 3.375g IV Q8hr.   Microbiology results: 4/3 BCx: no growth x 5 days  4/5 BCx (only 1 set): no growth x 5 days  4/7 BCx: no growth x 5 day  4/2 Wound Cx: multiple organisms 4/3 UCx: no growth  4/7 UCx: no growth  4/7 Tracheal Aspirate: few candida albicans  4/2 MRSA PCR: negative  Thank you for allowing pharmacy to be a part of this patient's care.  Simpson,Michael L 08/15/2017 4:01 PM

## 2017-08-15 NOTE — Progress Notes (Signed)
PHARMACY - ADULT TOTAL PARENTERAL NUTRITION CONSULT NOTE   Pharmacy Consult for TPN Management  Indication: Bowel perfortation/NPO   Patient Measurements: Height: 5\' 9"  (175.3 cm) Weight: 198 lb 6.6 oz (90 kg) IBW/kg (Calculated) : 70.7 TPN AdjBW (KG): 76.9 Body mass index is 29.3 kg/m.  Assessment:  Pharmacy consulted for TPN management for 57 yo male started on TPN. Today is day 7 without nutrition. Patient had umbilical hernia repair on 4/2 and had diverting loop ileostomy on 4/7. Patient returned to OR on 4/11 and is now re-intubated and is sedated on propofol and fentanyl.   TPN Access: PICC (placed 4/10)  TPN start date: 4/8    Current Nutrition: N/A  Plan:  Phosphorus is elevated and sodium is high end normal. Will order Clinimix 5/15 without electrolytes. Will order potassium 30mEq IV x 1 and magnesium 2g IV x 1. Per conversation with dietary, will hold lipids while on propofol. If propofol is stopped, can resume lipids at 9825mL/hr.    Continue Clinimix 5/15 witout electrolytes @ 6683mL/hr.  Continue Lantus 10 units daily and SSI Q6hr. - Patient used 6 units of SSI in previous 24 hours.  Will obtains labs per protocol.  Protonix IV Daily.   Pharmacy will continue to monitor and adjust per consult.   Cory Graham,Cory Graham 08/15/2017,4:25 PM

## 2017-08-15 NOTE — Progress Notes (Signed)
Nutrition Follow-up  DOCUMENTATION CODES:   Obesity unspecified  INTERVENTION:  Continue current TPN regimen of Clinimix 5/15 with no electrolytes at 52m/hr Hold 20% ILE at 267mhr while patient is on propofol at 21.41m58mr (570.24 calories)  Provides 1885 kcal, 100 grams of protein, 1992m91mtal volume (82% of estimated needs while patient has fevers, 97% of estimated needs when normothermic)  Discussed with RN, patient will remain on sedation until he returns to OR 4/14  NUTRITION DIAGNOSIS:   Inadequate oral intake related to inability to eat as evidenced by NPO status. -ongoing  GOAL:   Provide needs based on ASPEN/SCCM guidelines -met  MONITOR:   Vent status, Labs, Weight trends, TF tolerance, I & O's  ASSESSMENT:   57 y40r old male with no significant PMHx presented for evaluation of abdominal pain, fever, N/V, and AMS found to have pneumoperitoneum and umbilical hernia s/p exploratory laparotomy, peritoneal lavage, and umbilical hernia repair early AM of 4/2 but no source of perforation was found.  Patient is currently intubated on ventilator support MV: 10.5 L/min Temp (24hrs), Avg:100.2 F (37.9 C), Min:98.1 F (36.7 C), Max:102.5 F (39.2 C) Propofol: 21.6 ml/hr --> 570 calories  -Right IJ was placed on 4/3 for dialysis access. Patient then had an improvement in UOP so dialysis was not started. -Patient began having liquid feces draining through base of incision on 4/6. -Returned to OR on 4/7 for re-exploration of recent laparotomy, abdominal washout, creation of diverting loop ileostomy, placement of bilateral pelvic Blake drains, closure of abdominal wound, and placement of wound VAC.  -Patient was extubated on 4/8. -Returned from OR 4/11 on VENT following I&D, LUQ drains, wound vac placement  Discussed in rounds Wt upon admit 196 pounds, weighed today with RN he is 207 pounds. Appears initial weight was correct considering he is 21L Fluid Positive Resting  on vent, sedated until he returns to OR 4/14 for another washout.   Intake/Output Summary (Last 24 hours) at 08/15/2017 1542 Last data filed at 08/15/2017 1500 Gross per 24 hour  Intake 4081.03 ml  Output 2625 ml  Net 1456.03 ml    Labs reviewed:  BUN/Creatinine 34/1.78 Phos 4.9 Triglycerides 123  Medications reviewed and include:  Insulin Fentanyl gtt  Diet Order:  .TPN (CLINIMIX-E) Adult .TPN (CLINIMIX-E) Adult  EDUCATION NEEDS:   No education needs have been identified at this time  Skin:  Skin Assessment: Skin Integrity Issues: Skin Integrity Issues:: Other (Comment) Incisions: closed incision to abdomen with wound VAC in place Other: RUQ drains  Last BM:  08/13/2017 (ileostomy)  Height:   Ht Readings from Last 1 Encounters:  08/05/17 _0  (1.753 m)    Weight:   Wt Readings from Last 1 Encounters:  08/15/17 198 lb 6.6 oz (90 kg)    Ideal Body Weight:  72.7 kg  BMI:  Body mass index is 29.3 kg/m.  Estimated Nutritional Needs:   Kcal:  23011191U 2003b)  Protein:  116-134 grams (1.3-1.5g/kg)  Fluid:  per MD  WillSatira Anisrd, MS, RD LDN Inpatient Clinical Dietitian Pager [phone]

## 2017-08-15 NOTE — Progress Notes (Signed)
Complex pt with now multiple reintervention More recently dehiscence of upper fascia w necrosis requiring abd washout w debridement and wound VAC placement in addition to two new drains  HE is intubated VSS  PE Sedated Abd: wound vac in place drain w purulent output Ileostomy in place retracted and w tension  A/P I will plan to do another washout on Sunday am, we will address ostomy at that time and see if there is anything else I might be able to do His condition is critical  Continue TPN, A/Bs, vent support and drains

## 2017-08-15 NOTE — Progress Notes (Signed)
This 57 year old gentleman remains critically ill on mechanical ventilation. He is POD #1 of debridement of subcutaneous tissue and fascia of abdominal wall wound. POD# 5 of laparotomy with diverting loop ileostomy and POD # 10 of exploratory laparotomy for treatment of perforated viscus.  He remains on mechanical ventilation.    Vitals:   08/15/17 1100 08/15/17 1200 08/15/17 1202 08/15/17 1300  BP: (!) 89/53 (!) 91/54  (!) 92/55  Pulse: 67 67  67  Resp: 17 17  19   Temp:      TempSrc:      SpO2: 99% 98% 99% 99%  Weight:      Height:      RA  Gen: No overt respiratory distress, comfortable on the vent HEENT: NCAT, sclerae white Neck: No JVD Lungs: No adventitious sounds anteriorly Cardiovascular: Regular, no M Abdomen: Distended, retaining sutures, JPs x 4, ileostomy- dusky, patent, wound vac in place Ext: 1+ upper extremities oedema Neuro: Except for cognitive dysfunction, no deficits Skin: warm and dry  BMP Latest Ref Rng & Units 08/15/2017 08/06/2017 08/18/2017  Glucose 65 - 99 mg/dL 161(W130(H) - 960(A145(H)  BUN 6 - 20 mg/dL 54(U34(H) - 17  Creatinine 0.61 - 1.24 mg/dL 9.81(X1.78(H) - 9.141.02  Sodium 135 - 145 mmol/L 144 - 146(H)  Potassium 3.5 - 5.1 mmol/L 3.5 3.4(L) 3.0(L)  Chloride 101 - 111 mmol/L 113(H) - 113(H)  CO2 22 - 32 mmol/L 27 - 29  Calcium 8.9 - 10.3 mg/dL 7.8(G6.8(L) - 7.1(L)    CBC Latest Ref Rng & Units 08/15/2017 08/15/2017 08/13/2017  WBC 3.8 - 10.6 K/uL 7.9 5.9 8.7  Hemoglobin 13.0 - 18.0 g/dL 8.2(L) 6.4(L) 7.9(L)  Hematocrit 40.0 - 52.0 % 24.7(L) 20.0(L) 24.5(L)  Platelets 150 - 440 K/uL 397 453(H) 502(H)    CXR: No new film  IMPRESSION: Severe sepsis Peritonitis Acute Kidney Injury Hypovolemia Occult bowel perforation Delirium due to critical illness On TPN Deconditioning Anemia of critical illness s/p 2 units PRBC today  PLAN/REC: Transfused 2 unit PRBC this AM Will give 1 litre of IVF as patient appears to be hypovolemic Continue Zosyn, fluconazole Continue  TPN Critical care medicine will assist in management of ventilator postoperatively    Jackson LatinoKarol Tyah Acord, MD PCCM service Pager [phone] 08/15/2017 2:12 PM

## 2017-08-15 NOTE — Progress Notes (Signed)
PT Cancellation Note  Patient Details Name: Cory Graham MRN: [id number] DOB: 1961-01-23   Cancelled Treatment:    Reason Eval/Treat Not Completed: Medical issues which prohibited therapy(Per chart review, patient status post irrigation and debridement of abdominal wounds (4/11); now intubated/sedated post-op.  Will complete initial order. Please re-consult when patient extubated and appropriate for active participation with session.)  Zohair Epp H. Manson PasseyBrown, PT, DPT, NCS 08/15/17, 9:16 AM [phone]

## 2017-08-15 NOTE — Progress Notes (Signed)
   08/15/17 1410  Clinical Encounter Type  Visited With Patient not available  Visit Type Initial   Chaplain visit, family not present.  Chaplain offered silent prayer for patient, family and care team.

## 2017-08-15 NOTE — Consult Note (Addendum)
WOC Nurse wound and ostomy follow up note POD 1 Examination under anesthesia, debridement of subcutaneous tissue and fascia of the abdominal wall wound, placement of left upper quadrant right upper quadrant drains (08/18/2017) Dr. Excell Seltzerooper POD 5 laparotomy with diverting loop ileostomy (08/05/2017) Dr. Earlene Plateravis POD 10 exploratory laparotomy (08/05/17) Dr. Hazle Quantintron-Diaz  Brief visit to examine dressing and pouch intraoperatively applied last evening. NPWT seal is intact over white and black foam (dressing placed into proximal cavity opened yesterday). Drape covers remainder of incision and 4 retention sutures.  RLQ pouch over ileostomy is incorporated into drape and is not disturbed today.  Sutures (2 remaining) have been cut from bridge but stoma remains under tension and in a crease. Skin barrier is cut wide and immediate peristomal skin is exposed.  Ostomy is not functioning so this tissue is not at immediate risk, but will be once function returns.  Heels are floated. I will add Prevalon Boots bilaterally. I will ask Nursing to place prophylactic silicone foam dressings over the sacrum today as patient is at high risk for sacral pressure injury.  No family in room.  WOC nursing team will follow, and will remain available to this patient, the nursing, srugical and medical teams.  There are no WOC Nursing services over the weekend; one of my partners will see patient on Monday, 08/18/17. Thanks, Ladona MowLaurie Amal Saiki, MSN, RN, GNP, Hans EdenCWOCN, CWON-AP, FAAN  Pager# [phone]

## 2017-08-15 NOTE — Progress Notes (Signed)
OT Cancellation Note  Patient Details Name: Cory Graham MRN: [id number] DOB: [birth year]   Cancelled Treatment:    Reason Eval/Treat Not Completed: Medical issues which prohibited therapy(Pt. underwent irrigation, and debridement of abdominal wounds on 4/11. Pt. is now intubated/sedated post-op. Will complete the OT order at this time. Will need a new order when medically appropriate for therapy.)  Olegario MessierElaine Woodroe Vogan, MS, OTR/L 08/15/2017, 9:46 AM

## 2017-08-16 ENCOUNTER — Inpatient Hospital Stay: Payer: Medicaid Other

## 2017-08-16 DIAGNOSIS — J9600 Acute respiratory failure, unspecified whether with hypoxia or hypercapnia: Secondary | ICD-10-CM

## 2017-08-16 DIAGNOSIS — R6521 Severe sepsis with septic shock: Secondary | ICD-10-CM

## 2017-08-16 LAB — COMPREHENSIVE METABOLIC PANEL
ALBUMIN: 1.1 g/dL — AB (ref 3.5–5.0)
ALT: 9 U/L — AB (ref 17–63)
AST: 21 U/L (ref 15–41)
Alkaline Phosphatase: 83 U/L (ref 38–126)
Anion gap: 6 (ref 5–15)
BUN: 44 mg/dL — AB (ref 6–20)
CHLORIDE: 112 mmol/L — AB (ref 101–111)
CO2: 24 mmol/L (ref 22–32)
CREATININE: 2.05 mg/dL — AB (ref 0.61–1.24)
Calcium: 6.8 mg/dL — ABNORMAL LOW (ref 8.9–10.3)
GFR calc Af Amer: 39 mL/min — ABNORMAL LOW (ref >60)
GFR calc non Af Amer: 34 mL/min — ABNORMAL LOW (ref >60)
Glucose, Bld: 123 mg/dL — ABNORMAL HIGH (ref 65–99)
POTASSIUM: 3.5 mmol/L (ref 3.5–5.1)
SODIUM: 142 mmol/L (ref 135–145)
Total Bilirubin: 2.1 mg/dL — ABNORMAL HIGH (ref 0.3–1.2)
Total Protein: 5.4 g/dL — ABNORMAL LOW (ref 6.5–8.1)

## 2017-08-16 LAB — GLUCOSE, CAPILLARY
GLUCOSE-CAPILLARY: 132 mg/dL — AB (ref 65–99)
GLUCOSE-CAPILLARY: 132 mg/dL — AB (ref 65–99)
GLUCOSE-CAPILLARY: 120 mg/dL — AB (ref 65–99)
GLUCOSE-CAPILLARY: 135 mg/dL — AB (ref 65–99)
GLUCOSE-CAPILLARY: 127 mg/dL — AB (ref 65–99)
Glucose-Capillary: 121 mg/dL — ABNORMAL HIGH (ref 65–99)
Glucose-Capillary: 124 mg/dL — ABNORMAL HIGH (ref 65–99)

## 2017-08-16 LAB — CBC WITH DIFFERENTIAL/PLATELET
Basophils Absolute: 0.1 10*3/uL (ref 0–0.1)
Basophils Relative: 1 %
EOS ABS: 0.2 10*3/uL (ref 0–0.7)
EOS PCT: 3 %
HCT: 25.6 % — ABNORMAL LOW (ref 40.0–52.0)
HEMOGLOBIN: 8.4 g/dL — AB (ref 13.0–18.0)
LYMPHS ABS: 0.9 10*3/uL — AB (ref 1.0–3.6)
LYMPHS PCT: 10 %
MCH: 26.3 pg (ref 26.0–34.0)
MCHC: 33 g/dL (ref 32.0–36.0)
MCV: 79.6 fL — AB (ref 80.0–100.0)
Monocytes Absolute: 0.9 10*3/uL (ref 0.2–1.0)
Monocytes Relative: 10 %
Neutro Abs: 6.9 10*3/uL — ABNORMAL HIGH (ref 1.4–6.5)
Neutrophils Relative %: 76 %
PLATELETS: 393 10*3/uL (ref 150–440)
RBC: 3.21 MIL/uL — AB (ref 4.40–5.90)
RDW: 19.3 % — ABNORMAL HIGH (ref 11.5–14.5)
WBC: 9.1 10*3/uL (ref 3.8–10.6)

## 2017-08-16 LAB — PROTIME-INR
INR: 1.3
PROTHROMBIN TIME: 16.1 s — AB (ref 11.4–15.2)

## 2017-08-16 LAB — MAGNESIUM: MAGNESIUM: 2.1 mg/dL (ref 1.7–2.4)

## 2017-08-16 LAB — TRIGLYCERIDES: TRIGLYCERIDES: 154 mg/dL — AB (ref <150)

## 2017-08-16 LAB — PHOSPHORUS: Phosphorus: 5.2 mg/dL — ABNORMAL HIGH (ref 2.5–4.6)

## 2017-08-16 MED ORDER — CHLORHEXIDINE GLUCONATE 0.12% ORAL RINSE (MEDLINE KIT)
15.0000 mL | Freq: Two times a day (BID) | OROMUCOSAL | Status: DC
  Administered 2017-08-16 – 2017-08-20 (×9): 15 mL via OROMUCOSAL

## 2017-08-16 MED ORDER — TRACE MINERALS CR-CU-MN-SE-ZN 10-1000-500-60 MCG/ML IV SOLN
INTRAVENOUS | Status: AC
  Administered 2017-08-16: 18:00:00 via INTRAVENOUS
  Filled 2017-08-16 (×2): qty 1992

## 2017-08-16 MED ORDER — FLUCONAZOLE IN SODIUM CHLORIDE 200-0.9 MG/100ML-% IV SOLN
200.0000 mg | INTRAVENOUS | Status: DC
  Administered 2017-08-16 – 2017-08-19 (×4): 200 mg via INTRAVENOUS
  Filled 2017-08-16 (×7): qty 100

## 2017-08-16 MED ORDER — ORAL CARE MOUTH RINSE
15.0000 mL | OROMUCOSAL | Status: DC
  Administered 2017-08-16 – 2017-08-20 (×42): 15 mL via OROMUCOSAL

## 2017-08-16 MED ORDER — ALBUMIN HUMAN 25 % IV SOLN
100.0000 g | Freq: Once | INTRAVENOUS | Status: AC
  Administered 2017-08-16: 100 g via INTRAVENOUS
  Filled 2017-08-16: qty 400

## 2017-08-16 MED ORDER — FLUCONAZOLE IN SODIUM CHLORIDE 200-0.9 MG/100ML-% IV SOLN: 200.0000 mg | INTRAVENOUS | Status: DC

## 2017-08-16 MED ORDER — HEPARIN SODIUM (PORCINE) 5000 UNIT/ML IJ SOLN
5000.0000 [IU] | Freq: Three times a day (TID) | INTRAMUSCULAR | Status: DC
  Administered 2017-08-16 – 2017-08-20 (×12): 5000 [IU] via SUBCUTANEOUS
  Filled 2017-08-16 (×12): qty 1

## 2017-08-16 MED ORDER — MAGNESIUM SULFATE 2 GM/50ML IV SOLN
2.0000 g | Freq: Once | INTRAVENOUS | Status: AC
  Administered 2017-08-16: 2 g via INTRAVENOUS
  Filled 2017-08-16: qty 50

## 2017-08-16 MED ORDER — POTASSIUM CHLORIDE 10 MEQ/100ML IV SOLN
10.0000 meq | INTRAVENOUS | Status: AC
  Administered 2017-08-16 (×3): 10 meq via INTRAVENOUS
  Filled 2017-08-16 (×3): qty 100

## 2017-08-16 NOTE — Progress Notes (Signed)
Pharmacy Antibiotic Note  Cory Graham is a 57 y.o. male admitted on 08/31/2017 with bowel perforation s/p exploratory laparotomy. Patient returned to OR on 4/11 and is now requiring mechanical ventilation. Patient has been receiving Zosyn. Patient initially ordered anidulafungin. After discussion with Dr. Sampson GoonFitzgerald on 4/4, patient transition from anidulafungin to fluconazole.   Plan: Will adjust fluconazole to 200mg  IV Q24H due to declining renal function.  Zosyn EI 3.375g IV Q8hr.   Height: 5\' 9"  (175.3 cm) Weight: 208 lb 15.9 oz (94.8 kg) IBW/kg (Calculated) : 70.7  Temp (24hrs), Avg:98.7 F (37.1 C), Min:98.1 F (36.7 C), Max:99.6 F (37.6 C)  Recent Labs  Lab 08/11/17 0516 08/12/17 0459 08/13/17 0509 08/17/2017 0620 08/29/2017 0922 08/15/17 0029 08/15/17 1036 08/15/17 1147 08/16/17 0522  WBC 15.5* 9.8 8.7  --   --  5.9 7.9  --  9.1  CREATININE 1.06 1.00 0.94 0.94 1.02  --   --  1.78* 2.05*  LATICACIDVEN 1.5  --   --   --   --   --   --   --   --     Estimated Creatinine Clearance: 44.1 mL/min (A) (by C-G formula based on SCr of 2.05 mg/dL (H)).    No Known Allergies  Antimicrobials this admission: Zosyn 4/1 >>  vancomycin 4/4 >> 4/4 anidulafungin 4/4 x 1 Fluconazole 4/4 >>  Dose adjustments this admission: 4/6 Zosyn transitioned to 4.5g IV Q8hr.  4/6 Fluconazole transitioned to 400mg  IV Q24hr.  4/11 Zosyn transitioned to 3.375g IV Q8hr.   Microbiology results: 4/3 BCx: no growth x 5 days  4/5 BCx (only 1 set): no growth x 5 days  4/7 BCx: no growth x 5 day  4/2 Wound Cx: multiple organisms 4/3 UCx: no growth  4/7 UCx: no growth  4/7 Tracheal Aspirate: few candida albicans  4/2 MRSA PCR: negative  Thank you for allowing pharmacy to be a part of this patient's care.  Jayden Kratochvil C 08/16/2017 10:39 AM

## 2017-08-16 NOTE — Progress Notes (Signed)
Complex pt with now multiple reintervention More recently dehiscence of upper fascia w necrosis requiring abd washout w debridement and wound VAC placement in addition to two new drains VSS Not on pressors yet  PE Sedated on the vent Abd: vac in place ostomy in place teneous. 4 JP w some purulent drainage. Distended  A/P Abdominal catastrophe We will plan for abd washout and possible ostomy revision in am Guarded prognosis given his multiple issues D/W family yesterday at length Continue A/Bs May start trophic feeds

## 2017-08-16 NOTE — Progress Notes (Signed)
PHARMACY - ADULT TOTAL PARENTERAL NUTRITION CONSULT NOTE   Pharmacy Consult for TPN Management  Indication: Bowel perfortation/NPO   Patient Measurements: Height: 5\' 9"  (175.3 cm) Weight: 208 lb 15.9 oz (94.8 kg) IBW/kg (Calculated) : 70.7 TPN AdjBW (KG): 76.9 Body mass index is 30.86 kg/m.  Assessment:  Pharmacy consulted for TPN management for 57 yo male started on TPN. Today is day 7 without nutrition. Patient had umbilical hernia repair on 4/2 and had diverting loop ileostomy on 4/7. Patient returned to OR on 4/11 and is now re-intubated and is sedated on propofol and fentanyl.   TPN Access: PICC (placed 4/10)  TPN start date: 4/8   Phosphorus is elevated and sodium is high end normal. BG 112 -132 with 6 units SSI in past 24 hours.   Current Nutrition: N/A  Plan:  Phosphorus is elevated and sodium is high end normal. Will order Clinimix 5/15 without electrolytes. Will order potassium 30mEq IV x 1 and magnesium 2g IV x 1. Per conversation with dietary, will hold lipids while on propofol. If propofol is stopped, can resume lipids at 3625mL/hr.   Continue SSI and insulin glargine 10 units daily  Garlon HatchetJody Kymia Simi, PharmD, BCPS Clinical Pharmacist 08/16/2017 10:11 AM

## 2017-08-16 NOTE — Progress Notes (Signed)
This 57 year old gentleman remains critically ill on mechanical ventilation. He is POD #2 of debridement of subcutaneous tissue and fascia of abdominal wall wound. POD# 6 of laparotomy with diverting loop ileostomy and POD # 11 of exploratory laparotomy for treatment of perforated viscus.  He remains on mechanical ventilation.    Vitals:   08/16/17 1200 08/16/17 1230 08/16/17 1300 08/16/17 1330  BP: (!) 93/53 (!) 91/56 (!) 93/50 (!) 92/56  Pulse: 69 69 68 68  Resp: 18 18 17 18   Temp: 99.8 F (37.7 C)     TempSrc: Oral     SpO2: 97% 97% 96% 96%  Weight:      Height:      RA  Gen: No overt respiratory distress, comfortable on the vent HEENT: NCAT, sclerae white Neck: No JVD Lungs: No adventitious sounds anteriorly, no increase airway pressure Cardiovascular: Regular, no M Abdomen: Distended, retaining sutures, JPs x 4, ileostomy- dusky, patent, wound vac in place Ext: 2+ upper extremities oedema Neuro: Sedated Skin: warm and dry  BMP Latest Ref Rng & Units 08/16/2017 08/15/2017 03/31/18  Glucose 65 - 99 mg/dL 284(X123(H) 324(M130(H) -  BUN 6 - 20 mg/dL 01(U44(H) 27(O34(H) -  Creatinine 0.61 - 1.24 mg/dL 5.36(U2.05(H) 4.40(H1.78(H) -  Sodium 135 - 145 mmol/L 142 144 -  Potassium 3.5 - 5.1 mmol/L 3.5 3.5 3.4(L)  Chloride 101 - 111 mmol/L 112(H) 113(H) -  CO2 22 - 32 mmol/L 24 27 -  Calcium 8.9 - 10.3 mg/dL 4.7(Q6.8(L) 6.8(L) -    CBC Latest Ref Rng & Units 08/16/2017 08/15/2017 08/15/2017  WBC 3.8 - 10.6 K/uL 9.1 7.9 5.9  Hemoglobin 13.0 - 18.0 g/dL 2.5(Z8.4(L) 5.6(L8.2(L) 6.4(L)  Hematocrit 40.0 - 52.0 % 25.6(L) 24.7(L) 20.0(L)  Platelets 150 - 440 K/uL 393 397 453(H)    CXR: No new film  IMPRESSION: Severe sepsis with shock now on Levophed Peritonitis- GNB and GPC- ID and sensi pending Acute Kidney Injury from ATN Hypovolemia from 3 rd spacing Occult bowel perforation Delirium due to critical illness On TPN Deconditioning Anemia of critical illness   PLAN/REC: Will give 100 grams of salt poor albumin Continue  on Levophed Continue Zosyn, fluconazole Continue TPN Continue on vent support, not weanable Plans for wash out in OR in AM Will change Lovenox to SQ Heparin as creatinine is trending upward   Family:  Will update family when available  Jackson LatinoKarol Blake Goya, MD PCCM service Pager [phone] 08/16/2017 2:56 PM

## 2017-08-17 ENCOUNTER — Inpatient Hospital Stay: Payer: Medicaid Other | Admitting: Anesthesiology

## 2017-08-17 ENCOUNTER — Encounter: Admission: EM | Disposition: E | Payer: Self-pay | Source: Home / Self Care | Attending: Surgery

## 2017-08-17 DIAGNOSIS — M7989 Other specified soft tissue disorders: Secondary | ICD-10-CM

## 2017-08-17 DIAGNOSIS — S31109A Unspecified open wound of abdominal wall, unspecified quadrant without penetration into peritoneal cavity, initial encounter: Secondary | ICD-10-CM

## 2017-08-17 HISTORY — PX: WOUND DEBRIDEMENT: SHX247

## 2017-08-17 LAB — CBC WITH DIFFERENTIAL/PLATELET
Basophils Absolute: 0 10*3/uL (ref 0–0.1)
Basophils Relative: 1 %
EOS ABS: 0.2 10*3/uL (ref 0–0.7)
Eosinophils Relative: 3 %
HCT: 23.2 % — ABNORMAL LOW (ref 40.0–52.0)
HEMOGLOBIN: 7.6 g/dL — AB (ref 13.0–18.0)
LYMPHS ABS: 1.1 10*3/uL (ref 1.0–3.6)
Lymphocytes Relative: 13 %
MCH: 26.1 pg (ref 26.0–34.0)
MCHC: 32.7 g/dL (ref 32.0–36.0)
MCV: 79.9 fL — ABNORMAL LOW (ref 80.0–100.0)
MONO ABS: 1.1 10*3/uL — AB (ref 0.2–1.0)
MONOS PCT: 13 %
NEUTROS PCT: 70 %
Neutro Abs: 5.9 10*3/uL (ref 1.4–6.5)
Platelets: 374 10*3/uL (ref 150–440)
RBC: 2.9 MIL/uL — ABNORMAL LOW (ref 4.40–5.90)
RDW: 19.6 % — AB (ref 11.5–14.5)
WBC: 8.3 10*3/uL (ref 3.8–10.6)

## 2017-08-17 LAB — BASIC METABOLIC PANEL
Anion gap: 7 (ref 5–15)
BUN: 58 mg/dL — AB (ref 6–20)
CHLORIDE: 110 mmol/L (ref 101–111)
CO2: 22 mmol/L (ref 22–32)
CREATININE: 2.85 mg/dL — AB (ref 0.61–1.24)
Calcium: 7.2 mg/dL — ABNORMAL LOW (ref 8.9–10.3)
GFR calc Af Amer: 26 mL/min — ABNORMAL LOW (ref >60)
GFR calc non Af Amer: 23 mL/min — ABNORMAL LOW (ref >60)
GLUCOSE: 120 mg/dL — AB (ref 65–99)
Potassium: 3.6 mmol/L (ref 3.5–5.1)
Sodium: 139 mmol/L (ref 135–145)

## 2017-08-17 LAB — BLOOD GAS, ARTERIAL
Acid-base deficit: 3.9 mmol/L — ABNORMAL HIGH (ref 0.0–2.0)
BICARBONATE: 21.6 mmol/L (ref 20.0–28.0)
DRAWN BY: 28459
FIO2: 40
MECHVT: 550 mL
Mechanical Rate: 18
O2 SAT: 98.3 %
PATIENT TEMPERATURE: 37
PCO2 ART: 40 mmHg (ref 32.0–48.0)
PEEP: 5 cmH2O
PH ART: 7.34 — AB (ref 7.350–7.450)
PO2 ART: 117 mmHg — AB (ref 83.0–108.0)

## 2017-08-17 LAB — MAGNESIUM: Magnesium: 2.4 mg/dL (ref 1.7–2.4)

## 2017-08-17 LAB — PROTIME-INR
INR: 1.32
PROTHROMBIN TIME: 16.3 s — AB (ref 11.4–15.2)

## 2017-08-17 LAB — PHOSPHORUS: Phosphorus: 5.6 mg/dL — ABNORMAL HIGH (ref 2.5–4.6)

## 2017-08-17 LAB — HEPATIC FUNCTION PANEL
ALT: 6 U/L — AB (ref 17–63)
AST: 15 U/L (ref 15–41)
Albumin: 2.2 g/dL — ABNORMAL LOW (ref 3.5–5.0)
Alkaline Phosphatase: 69 U/L (ref 38–126)
BILIRUBIN DIRECT: 1.6 mg/dL — AB (ref 0.1–0.5)
BILIRUBIN TOTAL: 2.4 mg/dL — AB (ref 0.3–1.2)
Indirect Bilirubin: 0.8 mg/dL (ref 0.3–0.9)
Total Protein: 5.9 g/dL — ABNORMAL LOW (ref 6.5–8.1)

## 2017-08-17 LAB — GLUCOSE, CAPILLARY
Glucose-Capillary: 129 mg/dL — ABNORMAL HIGH (ref 65–99)
Glucose-Capillary: 140 mg/dL — ABNORMAL HIGH (ref 65–99)

## 2017-08-17 LAB — TRIGLYCERIDES: Triglycerides: 142 mg/dL (ref <150)

## 2017-08-17 SURGERY — DEBRIDEMENT, WOUND, ABDOMEN
Anesthesia: General | Wound class: Dirty or Infected

## 2017-08-17 MED ORDER — PROPOFOL 500 MG/50ML IV EMUL
INTRAVENOUS | Status: AC
  Filled 2017-08-17: qty 50

## 2017-08-17 MED ORDER — CLINDAMYCIN PHOSPHATE 600 MG/50ML IV SOLN
600.0000 mg | Freq: Three times a day (TID) | INTRAVENOUS | Status: DC
  Administered 2017-08-17 – 2017-08-20 (×10): 600 mg via INTRAVENOUS
  Filled 2017-08-17 (×11): qty 50

## 2017-08-17 MED ORDER — MIDAZOLAM HCL 2 MG/2ML IJ SOLN
INTRAMUSCULAR | Status: DC | PRN
  Administered 2017-08-17: 2 mg via INTRAVENOUS

## 2017-08-17 MED ORDER — TRACE MINERALS CR-CU-MN-SE-ZN 10-1000-500-60 MCG/ML IV SOLN
INTRAVENOUS | Status: AC
  Administered 2017-08-17: 18:00:00 via INTRAVENOUS
  Filled 2017-08-17: qty 1992

## 2017-08-17 MED ORDER — VANCOMYCIN HCL IN DEXTROSE 1-5 GM/200ML-% IV SOLN
1000.0000 mg | INTRAVENOUS | Status: DC
  Filled 2017-08-17: qty 200

## 2017-08-17 MED ORDER — BUPIVACAINE LIPOSOME 1.3 % IJ SUSP
INTRAMUSCULAR | Status: AC
  Filled 2017-08-17: qty 20

## 2017-08-17 MED ORDER — ROCURONIUM BROMIDE 50 MG/5ML IV SOLN
INTRAVENOUS | Status: AC
  Filled 2017-08-17: qty 1

## 2017-08-17 MED ORDER — SODIUM CHLORIDE 0.9 % IJ SOLN
INTRAMUSCULAR | Status: AC
  Filled 2017-08-17: qty 50

## 2017-08-17 MED ORDER — BUPIVACAINE-EPINEPHRINE (PF) 0.25% -1:200000 IJ SOLN
INTRAMUSCULAR | Status: AC
  Filled 2017-08-17: qty 30

## 2017-08-17 MED ORDER — MIDAZOLAM HCL 2 MG/2ML IJ SOLN
INTRAMUSCULAR | Status: AC
  Filled 2017-08-17: qty 2

## 2017-08-17 MED ORDER — VANCOMYCIN HCL 10 G IV SOLR
1500.0000 mg | Freq: Once | INTRAVENOUS | Status: AC
  Administered 2017-08-17: 1500 mg via INTRAVENOUS
  Filled 2017-08-17: qty 1500

## 2017-08-17 MED ORDER — ROCURONIUM BROMIDE 100 MG/10ML IV SOLN
INTRAVENOUS | Status: DC | PRN
  Administered 2017-08-17: 20 mg via INTRAVENOUS
  Administered 2017-08-17: 30 mg via INTRAVENOUS
  Administered 2017-08-17: 50 mg via INTRAVENOUS

## 2017-08-17 SURGICAL SUPPLY — 50 items
APPLIER CLIP 11 MED OPEN (CLIP) ×3
APPLIER CLIP 13 LRG OPEN (CLIP) ×3
BAG ISOLATATION DRAPE 20X20 ST (DRAPES) ×1 IMPLANT
BLADE CLIPPER SURG (BLADE) ×3 IMPLANT
BLADE SURG 15 STRL LF DISP TIS (BLADE) ×1 IMPLANT
BLADE SURG 15 STRL SS (BLADE) ×2
CANISTER SUCT 3000ML PPV (MISCELLANEOUS) ×3 IMPLANT
CHLORAPREP W/TINT 26ML (MISCELLANEOUS) ×3 IMPLANT
CLIP APPLIE 11 MED OPEN (CLIP) ×1 IMPLANT
CLIP APPLIE 13 LRG OPEN (CLIP) ×1 IMPLANT
DRAPE ISOLATE BAG 20X20 STRL (DRAPES) ×2
DRAPE LAPAROTOMY 100X77 ABD (DRAPES) ×3 IMPLANT
DRAPE TABLE BACK 80X90 (DRAPES) ×3 IMPLANT
DRSG TEGADERM 2-3/8X2-3/4 SM (GAUZE/BANDAGES/DRESSINGS) ×6 IMPLANT
DRSG TELFA 3X8 NADH (GAUZE/BANDAGES/DRESSINGS) ×3 IMPLANT
ELECT BLADE 6.5 EXT (BLADE) ×3 IMPLANT
ELECT REM PT RETURN 9FT ADLT (ELECTROSURGICAL) ×3
ELECTRODE REM PT RTRN 9FT ADLT (ELECTROSURGICAL) ×1 IMPLANT
GAUZE SPONGE 4X4 12PLY STRL (GAUZE/BANDAGES/DRESSINGS) ×6 IMPLANT
GLOVE BIO SURGEON STRL SZ7 (GLOVE) ×3 IMPLANT
GOWN STRL REUS W/ TWL LRG LVL3 (GOWN DISPOSABLE) ×2 IMPLANT
GOWN STRL REUS W/TWL LRG LVL3 (GOWN DISPOSABLE) ×4
HANDLE SUCTION POOLE (INSTRUMENTS) ×1 IMPLANT
HANDLE YANKAUER SUCT BULB TIP (MISCELLANEOUS) ×3 IMPLANT
KIT OSTOMY DRAINABLE 2.75 STR (WOUND CARE) ×3 IMPLANT
LIGASURE IMPACT 36 18CM CVD LR (INSTRUMENTS) IMPLANT
NEEDLE HYPO 22GX1.5 SAFETY (NEEDLE) ×6 IMPLANT
NEEDLE HYPO 25X1 1.5 SAFETY (NEEDLE) ×3 IMPLANT
PACK BASIN MAJOR ARMC (MISCELLANEOUS) ×3 IMPLANT
RELOAD PROXIMATE 75MM BLUE (ENDOMECHANICALS) IMPLANT
REMOVER STAPLE SKIN (DISPOSABLE) ×3 IMPLANT
SPONGE ABDOMINAL VAC ABTHERA (MISCELLANEOUS) ×3 IMPLANT
SPONGE LAP 18X18 5 PK (GAUZE/BANDAGES/DRESSINGS) ×6 IMPLANT
SPONGE LAP 18X36 2PK (MISCELLANEOUS) IMPLANT
STAPLER PROXIMATE 75MM BLUE (STAPLE) IMPLANT
STAPLER SKIN PROX 35W (STAPLE) ×3 IMPLANT
SUCTION POOLE HANDLE (INSTRUMENTS) ×3
SUT PDS AB 1 TP1 96 (SUTURE) ×6 IMPLANT
SUT SILK 2 0 (SUTURE) ×2
SUT SILK 2 0 SH CR/8 (SUTURE) ×3 IMPLANT
SUT SILK 2 0SH CR/8 30 (SUTURE) ×3 IMPLANT
SUT SILK 2-0 18XBRD TIE 12 (SUTURE) ×1 IMPLANT
SUT VIC AB 0 CT1 36 (SUTURE) ×6 IMPLANT
SUT VIC AB 2-0 SH 27 (SUTURE) ×4
SUT VIC AB 2-0 SH 27XBRD (SUTURE) ×2 IMPLANT
SYR 30ML LL (SYRINGE) ×6 IMPLANT
SYR 3ML LL SCALE MARK (SYRINGE) ×3 IMPLANT
TAPE MICROFOAM 4IN (TAPE) ×3 IMPLANT
TRAY FOLEY W/METER SILVER 16FR (SET/KITS/TRAYS/PACK) ×3 IMPLANT
WND VAC CANISTER 500ML (MISCELLANEOUS) ×3 IMPLANT

## 2017-08-17 NOTE — Progress Notes (Signed)
   12-22-17 1110  Clinical Encounter Type  Visited With Family  Visit Type Follow-up  Referral From Nurse  Consult/Referral To Chaplain   Chaplain responded to page from nurse regarding possible support for patient spouse.  Chaplain introduced self to spouse and spoke of nurse's suggestion.  Spouse stated that 'she and God got this but thanks.'  Chaplain spoke of 24/7 chaplain availability and encouraged patient to have staff page chaplain if needed.

## 2017-08-17 NOTE — Anesthesia Preprocedure Evaluation (Addendum)
Anesthesia Evaluation  Patient identified by MRN, date of birth, ID band Patient unresponsive    Reviewed: Allergy & Precautions, H&P , NPO status , Patient's Chart, lab work & pertinent test results  History of Anesthesia Complications Negative for: history of anesthetic complications  Airway Mallampati: Intubated  TM Distance: <3 FB Neck ROM: full    Dental  (+) Chipped, Poor Dentition   Pulmonary neg sleep apnea, neg COPD, former smoker,  ETT in place on mechanical ventilation   breath sounds clear to auscultation- rhonchi (-) wheezing      Cardiovascular Exercise Tolerance: Good (-) CAD, (-) Past MI and (-) Cardiac Stents negative cardio ROS   Rhythm:Regular Rate:Normal - Systolic murmurs and - Diastolic murmurs    Neuro/Psych negative neurological ROS  negative psych ROS   GI/Hepatic Neg liver ROS, S/p multiple abdominal surgeries coming back for abdominal washout.   Endo/Other  negative endocrine ROS  Renal/GU Renal disease (AKI)     Musculoskeletal negative musculoskeletal ROS (+)   Abdominal (+) + obese,   Peds  Hematology negative hematology ROS (+)   Anesthesia Other Findings History reviewed. No pertinent past medical history.  Past Surgical History: 08/10/2017: LAPAROTOMY; N/A     Comment:  Procedure: EXPLORATORY LAPAROTOMY, removal of abdomen,               peritoneal lavage, umbilical hernia repair;  Surgeon:               Carolan Shiverintron-Diaz, Edgardo, MD;  Location: [facility] ORS;  Service:              General;  Laterality: N/A;  BMI    Body Mass Index:  33.83 kg/m      Reproductive/Obstetrics negative OB ROS                            Anesthesia Physical  Anesthesia Plan  ASA: IV  Anesthesia Plan: General ETT   Post-op Pain Management:    Induction: Intravenous  PONV Risk Score and Plan: 1 and Ondansetron  Airway Management Planned: Oral ETT  Additional  Equipment:   Intra-op Plan:   Post-operative Plan: Post-operative intubation/ventilation  Informed Consent: I have reviewed the patients History and Physical, chart, labs and discussed the procedure including the risks, benefits and alternatives for the proposed anesthesia with the patient or authorized representative who has indicated his/her understanding and acceptance.   Dental Advisory Given  Plan Discussed with: Anesthesiologist, CRNA and Surgeon  Anesthesia Plan Comments: (Wife consented over the phone [phone]  Wife consented for risks of anesthesia including but not limited to:  - adverse reactions to medications - damage to teeth, lips or other oral mucosa - sore throat or hoarseness - Damage to heart, brain, lungs or loss of life  Wife voiced understanding.)       Anesthesia Quick Evaluation

## 2017-08-17 NOTE — Progress Notes (Signed)
PHARMACY - ADULT TOTAL PARENTERAL NUTRITION CONSULT NOTE   Pharmacy Consult for TPN Management  Indication: Bowel perfortation/NPO   Patient Measurements: Height: 5\' 9"  (175.3 cm) Weight: 221 lb 9 oz (100.5 kg) IBW/kg (Calculated) : 70.7 TPN AdjBW (KG): 76.9 Body mass index is 32.72 kg/m.  Assessment:  Pharmacy consulted for TPN management for Cory Graham started on TPN. Patient with bowel perforation; on multiple antibiotics for necrotic abdominal infection. Worsening renal function.   TPN Access: PICC (placed 4/10)  TPN start date: 4/8   BG 112 -132 with 6 units SSI in past 24 hours, Phosphorus elevated.  Plan:  Will continue Clinimix 5/15 without electrolytes at 6783ml/hr. Will hold of on electrolyte supplementation due to declining renal function. Reassess with AM labs.   Per conversation with dietary, continue to hold lipids while on propofol. If propofol is stopped, can resume lipids at 325mL/hr.   Continue SSI and insulin glargine 10 units daily  Garlon HatchetJody Taylormarie Register, PharmD, BCPS Clinical Pharmacist 08/16/2017 12:34 PM

## 2017-08-17 NOTE — Anesthesia Post-op Follow-up Note (Signed)
Anesthesia QCDR form completed.        

## 2017-08-17 NOTE — Op Note (Signed)
PROCEDURES: 1. Removal of wound vac 2. Reopenning of laparotomy with removal of retention sutures 3. Excisional debridement of abd wall to include, muscle, fascia and skin, measuring 35x20cms 4. Placement of temporary abd wound VAC ABthera 35x20cm wound  Pre-operative Diagnosis: Abdominal catastrophe with sepsis and open abdomen  Post-operative Diagnosis: Same  Surgeon: Merri Rayiego F Aleni Andrus   Anesthesia: General endotracheal anesthesia  ASA Class: 4  Surgeon: Sterling Bigiego Aquiles Ruffini , MD FACS  Anesthesia: Gen. with endotracheal tube   Findings: Necrotizing infection of abdominal wall to include fascia muscle caused from a combination of retention sutures, sepsis poor nutrition  Estimated Blood Loss: 20cc         Drains: none         Specimens: Fascia, muscle and subcutaneous tissue and the skin       Complications: none         Condition: guarded   The benefits, complications, treatment options, and expected outcomes were discussed with theFamily. The risks of bleeding, infection, recurrence of symptoms, failure to resolve symptoms,  bowel injury, any of which could require further surgery were reviewed with the patient.   The patient was taken to Operating Room, identified as Cory Graham and the procedure verified.  A Time Out was held and the above information confirmed.   She was taken to the operating room and placed in a supine position.  We removed the abdominal wound VAC as well as the ileostomy device.  We did remove the Hollister rod. The abdomen was prepped and draped in the usual sterile fashion to incorporate the drains.  Infection of the abdominal wound revealed foul-smelling purulent drainage from the lower incision with evidence of necrosis of the fascia.  The staples were removed confirming necrosis of the fascia.  We found and on arrival loop PDS that we removed.  Because of the necrotizing nature of the infection we needed to further damage control laparotomy and control the  source of infection.  For this reason I decided to remove the retention sutures and perform an aggressive excisional debridement to incorporate the skin subcutaneous tissue fascia muscle that were necrotic. Adequate hemostasis was obtained with electrocautery.  The abdominal cavity was washed with multiple liters of normal saline. Patient was placed to the upper portion where the drains were in place there was no evidence of any bile leak.  There was some indentation into the small bowel caused by the white form but there was no evidence of full-thickness injuries.  The ileostomy was patent and functional but was retracted.  After the abdomen was washed with multiple liters of saline we placed an Abthera wound VAC and customized it the anatomy of the patient. There was good seal of the VAC. An ileostomy supply was placed and I feel some of the lateral edges of the ileostomy wound with stoma paste to provide an adequate support.  We Will need to go back for another abdominal washout 48 hours or so  Needle and laparotomy count were correct and there were no immediate occasions  Sterling Bigiego Rayen Palen, MD, FACS

## 2017-08-17 NOTE — Transfer of Care (Signed)
Immediate Anesthesia Transfer of Care Note  Patient: Cory Graham  Procedure(s) Performed: ABDOMINAL WASHOUT WITH WOUND VAC dressing change (N/A )  Patient Location: PACU and ICU  Anesthesia Type:General  Level of Consciousness: sedated and Patient remains intubated per anesthesia plan  Airway & Oxygen Therapy: Patient remains intubated per anesthesia plan and Patient placed on Ventilator (see vital sign flow sheet for setting)  Post-op Assessment: Report given to RN and Post -op Vital signs reviewed and stable  Post vital signs: Reviewed and stable  Last Vitals:  Vitals Value Taken Time  BP 92/52 08/22/2017 10:04 AM  Temp    Pulse 78 08/25/2017 10:04 AM  Resp 18 08/20/2017 10:04 AM  SpO2 97 % 08/16/2017 10:04 AM  Vitals shown include unvalidated device data.  Last Pain:  Vitals:   08/22/2017 0800  TempSrc: Oral  PainSc:          Complications: No apparent anesthesia complications

## 2017-08-17 NOTE — Progress Notes (Signed)
This 57 year old gentleman remains critically ill on mechanical ventilation. He is POD #0 of reopening of laparotomy, removal of retention sutures, excisional debridement of abdominal wall ( muscle, fascia and skin) and replacement of wound vac.  POD #3 of debridement of subcutaneous tissue and fascia of abdominal wall wound. POD# 7 of laparotomy with diverting loop ileostomy and POD # 12 of exploratory laparotomy for treatment of perforated viscus.  He remains on mechanical ventilation.    Vitals:   08/06/2017 1700 09/01/2017 1730 08/18/2017 1800 08/20/2017 1830  BP: (!) 93/55 (!) 101/55 (!) 91/55 (!) 96/49  Pulse: 72 77 75 74  Resp: 18 13 18 18   Temp: 99.3 F (37.4 C)     TempSrc: Oral     SpO2: 100% 100% 100% 100%  Weight:      Height:      RA  Gen: No overt respiratory distress, comfortable on the vent HEENT: NCAT, sclerae white Neck: No JVD Lungs: No adventitious sounds anteriorly, no increase airway pressure Cardiovascular: Regular, no M Abdomen: Distended,  JPs x 4, ileostomy- pink and patent, wound vac in place Ext: 2+ upper extremities oedema Neuro: Sedated Skin: warm and dry  BMP Latest Ref Rng & Units 08/09/2017 08/16/2017 08/15/2017  Glucose 65 - 99 mg/dL 604(V120(H) 409(W123(H) 119(J130(H)  BUN 6 - 20 mg/dL 47(W58(H) 29(F44(H) 62(Z34(H)  Creatinine 0.61 - 1.24 mg/dL 3.08(M2.85(H) 5.78(I2.05(H) 6.96(E1.78(H)  Sodium 135 - 145 mmol/L 139 142 144  Potassium 3.5 - 5.1 mmol/L 3.6 3.5 3.5  Chloride 101 - 111 mmol/L 110 112(H) 113(H)  CO2 22 - 32 mmol/L 22 24 27   Calcium 8.9 - 10.3 mg/dL 7.2(L) 6.8(L) 6.8(L)    CBC Latest Ref Rng & Units 08/23/2017 08/16/2017 08/15/2017  WBC 3.8 - 10.6 K/uL 8.3 9.1 7.9  Hemoglobin 13.0 - 18.0 g/dL 7.6(L) 8.4(L) 8.2(L)  Hematocrit 40.0 - 52.0 % 23.2(L) 25.6(L) 24.7(L)  Platelets 150 - 440 K/uL 374 393 397    CXR: No new film  IMPRESSION: S/P reopening of laparotomy with removal of retention sutures, excisional debridement of abdominal wall and replacement of wound vac today. Severe sepsis  with shock on Levophed Peritonitis- GNB and GPC ( multiple diff organisms)- ID and sensi pending Acute Kidney Injury from ATN non oliguric Acute respiratory failure secondary to sepsis Hypovolemia from 3 rd spacing Occult bowel perforation On TPN Deconditioning Anemia of critical illness   PLAN/REC: Continue on Levophed Continue Zosyn, fluconazole; Clindamycin and Vancomycin ABX added today Continue TPN Continue on vent support, not yet weanable Plans for return to OR on tuesday  SQ Heparin for DVT prophylaxis becausea creatinine is trending upward   Family:  Wife updated; was very tearful  Patient remains critically ill.    Thank you for allowing me the privilege to care for this patient.  I have dedicated a total of 38 minutes in critical care time minus all appropriate exclusions.  Jackson LatinoKarol Fatin Bachicha, MD PCCM service Pager [phone] 08/22/2017 7:09 PM

## 2017-08-17 NOTE — Progress Notes (Signed)
Pharmacy Antibiotic Note  Cory Graham is a 57 y.o. male admitted on 08/18/2017 with bowel perforation s/p exploratory laparotomy. Patient returned to OR on 4/11 and is now requiring mechanical ventilation. Patient has been receiving Zosyn. Patient initially ordered anidulafungin. After discussion with Dr. Sampson GoonFitzgerald on 4/4, patient transition from anidulafungin to fluconazole. Based on would cultures, adding vancomycin and clindamycin. Patients creatinine clearance is continuing to decline.   Plan: Continue fluconazole to 200mg  IV Q24H.  Zosyn EI 3.375g IV Q8hr.   Clindamycin 600mg  IV Q8hr.   Vancomycin 1500mg  IV x 1. In setting of continued renal failure, will forgo stacked dose. Will schedule vancomycin 1000mg  IV Q24hr to start on 4/15. Will monitor renal function daily and adjust accordingly. Will obtain trough as clinically indicated.    Height: 5\' 9"  (175.3 cm) Weight: 221 lb 9 oz (100.5 kg) IBW/kg (Calculated) : 70.7  Temp (24hrs), Avg:100.4 F (38 C), Min:97.7 F (36.5 C), Max:103 F (39.4 C)  Recent Labs  Lab 08/11/17 0516  08/13/17 0509 08/12/2017 0620 08/12/2017 0922 08/15/17 0029 08/15/17 1036 08/15/17 1147 08/16/17 0522 08/07/2017 0415 08/23/2017 0446  WBC 15.5*   < > 8.7  --   --  5.9 7.9  --  9.1  --  8.3  CREATININE 1.06   < > 0.94 0.94 1.02  --   --  1.78* 2.05* 2.85*  --   LATICACIDVEN 1.5  --   --   --   --   --   --   --   --   --   --    < > = values in this interval not displayed.    Estimated Creatinine Clearance: 32.6 mL/min (A) (by C-G formula based on SCr of 2.85 mg/dL (H)).    No Known Allergies  Antimicrobials this admission: Zosyn 4/1 >>  vancomycin 4/4 >> 4/4 anidulafungin 4/4 x 1 Fluconazole 4/4 >> Clindamycin 4/14 >> Vancomycin 4/14 >>   Dose adjustments this admission: 4/6 Zosyn transitioned to 4.5g IV Q8hr.  4/6 Fluconazole transitioned to 400mg  IV Q24hr.  4/11 Zosyn transitioned to 3.375g IV Q8hr.  4/13 Fluconazole transitioned to  200mg  IV Q24hr.   Microbiology results: 4/3 BCx: no growth x 5 days  4/5 BCx (only 1 set): no growth x 5 days  4/7 BCx: no growth x 5 day  4/2 Wound Cx: multiple organisms 4/3 UCx: no growth  4/7 UCx: no growth  4/7 Tracheal Aspirate: few candida albicans  4/2 MRSA PCR: negative 4/11 Wound Culture: gram negative rods, gram positive cocci in pairs, gram positive rods  4/12 Wound Culture: Abundant Gram negative rods   Thank you for allowing pharmacy to be a part of this patient's care.  Simpson,Michael L 08/28/2017 11:16 AM

## 2017-08-18 ENCOUNTER — Encounter: Payer: Self-pay | Admitting: Surgery

## 2017-08-18 ENCOUNTER — Inpatient Hospital Stay: Payer: Medicaid Other

## 2017-08-18 DIAGNOSIS — M7989 Other specified soft tissue disorders: Secondary | ICD-10-CM

## 2017-08-18 DIAGNOSIS — K631 Perforation of intestine (nontraumatic): Secondary | ICD-10-CM

## 2017-08-18 LAB — COMPREHENSIVE METABOLIC PANEL
ALT: 7 U/L — ABNORMAL LOW (ref 17–63)
AST: 17 U/L (ref 15–41)
Albumin: 1.6 g/dL — ABNORMAL LOW (ref 3.5–5.0)
Alkaline Phosphatase: 92 U/L (ref 38–126)
Anion gap: 7 (ref 5–15)
BUN: 67 mg/dL — ABNORMAL HIGH (ref 6–20)
CHLORIDE: 105 mmol/L (ref 101–111)
CO2: 20 mmol/L — AB (ref 22–32)
Calcium: 6.8 mg/dL — ABNORMAL LOW (ref 8.9–10.3)
Creatinine, Ser: 3.86 mg/dL — ABNORMAL HIGH (ref 0.61–1.24)
GFR, EST AFRICAN AMERICAN: 18 mL/min — AB (ref >60)
GFR, EST NON AFRICAN AMERICAN: 16 mL/min — AB (ref >60)
Glucose, Bld: 151 mg/dL — ABNORMAL HIGH (ref 65–99)
POTASSIUM: 4.1 mmol/L (ref 3.5–5.1)
SODIUM: 132 mmol/L — AB (ref 135–145)
Total Bilirubin: 2.3 mg/dL — ABNORMAL HIGH (ref 0.3–1.2)
Total Protein: 5.5 g/dL — ABNORMAL LOW (ref 6.5–8.1)

## 2017-08-18 LAB — CBC WITH DIFFERENTIAL/PLATELET
BASOS ABS: 0.1 10*3/uL (ref 0–0.1)
BASOS PCT: 1 %
EOS ABS: 0.2 10*3/uL (ref 0–0.7)
Eosinophils Relative: 2 %
HCT: 23.2 % — ABNORMAL LOW (ref 40.0–52.0)
HEMOGLOBIN: 7.2 g/dL — AB (ref 13.0–18.0)
Lymphocytes Relative: 10 %
Lymphs Abs: 1.3 10*3/uL (ref 1.0–3.6)
MCH: 25.3 pg — ABNORMAL LOW (ref 26.0–34.0)
MCHC: 31.2 g/dL — ABNORMAL LOW (ref 32.0–36.0)
MCV: 81.3 fL (ref 80.0–100.0)
Monocytes Absolute: 1.6 10*3/uL — ABNORMAL HIGH (ref 0.2–1.0)
Monocytes Relative: 12 %
NEUTROS ABS: 10.3 10*3/uL — AB (ref 1.4–6.5)
NEUTROS PCT: 75 %
Platelets: 403 10*3/uL (ref 150–440)
RBC: 2.86 MIL/uL — ABNORMAL LOW (ref 4.40–5.90)
RDW: 19.9 % — AB (ref 11.5–14.5)
WBC: 13.4 10*3/uL — ABNORMAL HIGH (ref 3.8–10.6)

## 2017-08-18 LAB — SURGICAL PATHOLOGY

## 2017-08-18 LAB — GLUCOSE, CAPILLARY
GLUCOSE-CAPILLARY: 118 mg/dL — AB (ref 65–99)
GLUCOSE-CAPILLARY: 127 mg/dL — AB (ref 65–99)
GLUCOSE-CAPILLARY: 173 mg/dL — AB (ref 65–99)
Glucose-Capillary: 133 mg/dL — ABNORMAL HIGH (ref 65–99)
Glucose-Capillary: 143 mg/dL — ABNORMAL HIGH (ref 65–99)

## 2017-08-18 LAB — TRIGLYCERIDES: TRIGLYCERIDES: 169 mg/dL — AB (ref <150)

## 2017-08-18 LAB — LACTIC ACID, PLASMA: LACTIC ACID, VENOUS: 0.9 mmol/L (ref 0.5–1.9)

## 2017-08-18 LAB — MAGNESIUM: MAGNESIUM: 1.8 mg/dL (ref 1.7–2.4)

## 2017-08-18 LAB — PHOSPHORUS: PHOSPHORUS: 7.2 mg/dL — AB (ref 2.5–4.6)

## 2017-08-18 LAB — PREPARE RBC (CROSSMATCH)

## 2017-08-18 MED ORDER — TRACE MINERALS CR-CU-MN-SE-ZN 10-1000-500-60 MCG/ML IV SOLN
INTRAVENOUS | Status: AC
  Administered 2017-08-18: 18:00:00 via INTRAVENOUS
  Filled 2017-08-18: qty 2000

## 2017-08-18 MED ORDER — VANCOMYCIN HCL IN DEXTROSE 1-5 GM/200ML-% IV SOLN
1000.0000 mg | INTRAVENOUS | Status: DC
  Administered 2017-08-18: 1000 mg via INTRAVENOUS
  Filled 2017-08-18 (×2): qty 200

## 2017-08-18 MED ORDER — SODIUM CHLORIDE 0.9 % IV SOLN
Freq: Once | INTRAVENOUS | Status: AC
  Administered 2017-08-18: 12:00:00 via INTRAVENOUS

## 2017-08-18 MED ORDER — SODIUM CHLORIDE 0.9 % IV SOLN
500.0000 mg | Freq: Two times a day (BID) | INTRAVENOUS | Status: DC
  Administered 2017-08-18 – 2017-08-19 (×2): 500 mg via INTRAVENOUS
  Filled 2017-08-18 (×3): qty 0.5

## 2017-08-18 NOTE — Progress Notes (Signed)
Nutrition Follow-up  DOCUMENTATION CODES:   Obesity unspecified  INTERVENTION:  Advance to new TPN regimen of Clinimix 5/15 with no electrolytes at 108 mL/hr. Provides 1840 kcal, 130 grams of protein, 2592 mL fluid daily.  Continue to withhold lipids as patient is on propofol. With current rate of propofol patient is receiving a total of 2218 kcal daily (105% PSU 2003b).  Continue adult MVI and trace elements as daily additives in TPN.  Plan is to hold off on trickle tube feeds at this time. Once patient is off vasopressors consider initiating trickle tube feeds.  NUTRITION DIAGNOSIS:   Inadequate oral intake related to inability to eat as evidenced by NPO status.  Ongoing - addressing with TPN.  GOAL:   Provide needs based on ASPEN/SCCM guidelines  Met with TPN.  MONITOR:   Vent status, Labs, Weight trends, TF tolerance, I & O's  REASON FOR ASSESSMENT:   Ventilator, Consult New TPN/TNA  ASSESSMENT:   57 year old male with no significant PMHx presented for evaluation of abdominal pain, fever, N/V, and AMS found to have pneumoperitoneum and umbilical hernia s/p exploratory laparotomy, peritoneal lavage, and umbilical hernia repair early AM of 4/2 but no source of perforation was found.   Pertinent Events: -Right IJ was placed on 4/3 for dialysis access. Patient then had an improvement in UOP so dialysis was not started. -Patient began having liquid feces draining through base of incision on 4/6. -Returned to OR on 4/7 for re-exploration of recent laparotomy, abdominal washout, creation of diverting loop ileostomy, placement of bilateral pelvic Blake drains, closure of abdominal wound, and placement of wound VAC.  -Patient was extubated on 4/8. -Patient was re-intubated late evening of 4/11 for surgery. Plan is to remain intubated for repeat washouts every 48 hours. -Returned on OR on 4/11 for debridement of subcutaneous tissue and fascia of abdominal wall wound with  placement of left upper quadrant and right upper quadrant drains. Foul-smelling odor and necrotic tissue in upper abdominal wall wound. -Returned to OR on 4/14 for reopening of laparotomy with removal of retention sutures, excisional debridement of abdominal wall including muscle, fascia, and skin (measuring 35cm x 20 cm), and placement of temporary ABThera wound VAC. Findings were necrotizing infection of abdominal wall.  Assessed patient at bedside with surgeon and RN. Intubated and sedated. Patient now has an open abdomen (35cm x 20cm per op note) with ABThera wound VAC in place. There are 4 JP drains in place. On previous intubation utilized 11-14 kcal/kg to estimate needs as patient is obese, but patient is now on day 14 of admission with very high calorie and protein needs from multiple surgeries and open abdomen. More appropriate to switch to [facility] to estimate needs at this point.  Enteral Access: 16 Fr. NGT placed 4/11 during surgery; terminates in gastric body per chest x-ray 4/13; 61 cm at right nare; currently to LIS  MAP: 62-74 mmHg; increasing dose of norepinephrine gtt overnight from around 5 mcg/min to current rate of 15 mcg/min  IV Access: left brachial double-lumen PICC placed 08/13/2017; tip in distal SVC per chest x-ray 4/11  TPN: pt has been receiving Clinimix 5/15 with no electrolytes at 83 mL/hr; lipids have been held as he was started on propofol  Patient is currently intubated on ventilator support MV: 9.6 L/min Temp (24hrs), Avg:99.4 F (37.4 C), Min:98 F (36.7 C), Max:100.3 F (37.9 C)  Propofol: 14.3 ml/hr (378 kcal daily)  Medications reviewed and include: Novolog 0-15 units Q6hrs (  received 6 units past 24 hrs), Lantus 10 units daily, clindamycin, famotidine, fentanyl gtt, Diflucan, norepinephrine gtt at 15 mcg/min, Zosyn, propofol gtt, vancomycin.  Labs reviewed: CBG 118-140 past 24 hrs, Sodium 132, CO2 20, BUN 67 (trending up), Creatinine 3.86  (trending up), Phosphorus 7.2 (trending up), eGFR 16 (trending down), Triglycerides 169. Pending lactic acid.  I/O: 1160 mL UOP yesterday (0.5 mL/kg/hr) - pt had previously been putting out >3L UOP daily; 45 mL output from ileostomy; 34 mL output from JP drain 1 RLQ; 87.5 mL output from JP drain 2 LLQ; 195 mL output from JP drain 3 LUQ; 117.5 mL outputfrom JP drain 4 RUQ; 1000 mL output from wound VAC (equivalent to loss of 15-30 grams of protein for each liter)  Weight trend: 98.9 kg on 4/15; +3.4 kg from wt of 95.5 kg on 4/2 (using 95.5 kg to estimate needs)  Discussed with RN, with Pharmacy, and on rounds. Also discussed with surgery. Plan is to hold off on trickle tube feeds now that patient is on a vasopressor.  Diet Order:  TPN (CLINIMIX) Adult without lytes TPN (CLINIMIX) Adult without lytes  EDUCATION NEEDS:   No education needs have been identified at this time  Skin:  Skin Assessment: Skin Integrity Issues: Skin Integrity Issues:: Other (Comment) Incisions: closed incision to abdomen with wound VAC in place Other: RUQ drains  Last BM:  08/18/2017 - 15 mL output from ileostomy  Height:   Ht Readings from Last 1 Encounters:  08/05/17 5' 9" (1.753 m)    Weight:   Wt Readings from Last 1 Encounters:  08/18/17 218 lb 0.6 oz (98.9 kg)    Ideal Body Weight:  72.7 kg  BMI:  Body mass index is 32.2 kg/m.  Estimated Nutritional Needs:   Kcal:  2108 (PSU 2003b w/ MSJ 1764, Ve 9.6, Tmax 37.9)  Protein:  115-143 grams (1.2-1.5 grams/kg)  Fluid:  2.2-2.5 L/day (30-35 mL/kg IBW)  Willey Blade, MS, RD, LDN Office: [phone] Pager: [phone] After Hours/Weekend Pager: [phone]

## 2017-08-18 NOTE — Consult Note (Addendum)
WOC follow-up: Surgical team changed ostomy pouch and abd Vac in the OR on Sun.  According to the EMR; plans are to return on Tues and change again in that setting.  Vac is intact with good seal at 125mm cont suction.  Pouch is intact with good seal and mod amt liquid brown stool. WOC team will assist with ostomy teaching when patient is stable, and will assume Vac changes when the surgical team requests that we resume performing the changes.  Cammie Mcgeeawn Tehya Leath MSN, RN, CWOCN, [place]AP, CNS [phone]

## 2017-08-18 NOTE — Progress Notes (Signed)
SURGICAL PROGRESS NOTE  Hospital Day(s): 14.   Post op day(s): 1 Day Post-Op.   Interval History: Patient seen and examined, hypotension despite Levophed increased to 15 mcg/min overnight with decreased urine output. Loop ileostomy has been functioning without leakage from ostomy appliance.  Review of Systems: Unable to assess due to intubation and sedation   Vital signs in last 24 hours: [min-max] current  Temp:  [98 F (36.7 C)-102.2 F (39 C)] 98 F (36.7 C) (04/15 0400) Pulse Rate:  [70-86] 70 (04/15 0600) Resp:  [11-21] 18 (04/15 0600) BP: (91-115)/(44-55) 99/52 (04/15 0600) SpO2:  [97 %-100 %] 98 % (04/15 0600) FiO2 (%):  [40 %] 40 % (04/15 0331) Weight:  [218 lb 0.6 oz (98.9 kg)] 218 lb 0.6 oz (98.9 kg) (04/15 0413)     Height: 5\' 9"  (175.3 cm) Weight: 218 lb 0.6 oz (98.9 kg) BMI (Calculated): 32.18   Intake/Output this shift:  No intake/output data recorded.   Intake/Output last 2 shifts:  @IOLAST2SHIFTS @   Physical Exam:  Constitutional: sedated in no apparent distress  HENT: normocephalic without obvious abnormality  Eyes: PERRL, B/L sclera anicteric  Respiratory: intubated, breathing non-labored at rest  Cardiovascular: regular rate and sinus tachycardia  Gastrointestinal: soft with open abdomen, wound VAC intact, ileostomy functional though retracted without surrounding erythema, post-surgical abdominal drains x4 to closed bulb suction with serosanguinous fluid from all except LUQ drain with purulent fluid  Labs:  CBC Latest Ref Rng & Units 08/18/2017 08/11/2017 08/16/2017  WBC 3.8 - 10.6 K/uL 13.4(H) 8.3 9.1  Hemoglobin 13.0 - 18.0 g/dL 7.2(L) 7.6(L) 8.4(L)  Hematocrit 40.0 - 52.0 % 23.2(L) 23.2(L) 25.6(L)  Platelets 150 - 440 K/uL 403 374 393   CMP Latest Ref Rng & Units 08/18/2017 08/16/2017 08/16/2017  Glucose 65 - 99 mg/dL 811(B) 147(W) 295(A)  BUN 6 - 20 mg/dL 21(H) 08(M) 57(Q)  Creatinine 0.61 - 1.24 mg/dL 4.69(G) 2.95(M) 8.41(L)  Sodium 135 - 145  mmol/L 132(L) 139 142  Potassium 3.5 - 5.1 mmol/L 4.1 3.6 3.5  Chloride 101 - 111 mmol/L 105 110 112(H)  CO2 22 - 32 mmol/L 20(L) 22 24  Calcium 8.9 - 10.3 mg/dL 2.4(M) 7.2(L) 6.8(L)  Total Protein 6.5 - 8.1 g/dL 0.1(U) 5.9(L) 5.4(L)  Total Bilirubin 0.3 - 1.2 mg/dL 2.3(H) 2.4(H) 2.1(H)  Alkaline Phos 38 - 126 U/L 92 69 83  AST 15 - 41 U/L 17 15 21   ALT 17 - 63 U/L 7(L) 6(L) 9(L)   Imaging studies:  Chest X-ray (08/18/2017) Similar appearance of the chest x-ray with bibasilar, right greater than left opacity likely a combination of pleural fluid, atelectasis/ consolidation, and edema. Unchanged endotracheal tube, gastric  tube, and left upper extremity PICC.   Assessment/Plan: 57 y.o. male with septic shock secondary to abdominal catastrophe 1 Day Post-Op s/p abdominal re-exploration with fascial debridement for necrotizing soft tissue infection secondary to fecal wound contamination for which challenging diverting loop ileostomy was performed and large B/L pelvic drains placed for colorectal perforation following negative laparotomy with peritoneal lavage for pneumoperitoneum indicating perforated viscus and 2 Liters purulent fluid without focal pathology, perforation, or identified source for contamination/sepsis, complicated by AKI and by pertinent comorbidities including morbid obesity (BMI >33) a lack of known medical history due to patient not having seen a physician or similar in many years.   - continue ICU supportive care   - IV antibiotics, tailor to cultures   - wean vasopressors as tolerated  - consider transfusion considering anemia on  vasopressors   - discussed possible stress-dose steroids with ICU physician  - will plan to return to OR tomorrow or Wednesday pending OR availability  - guarded prognosis discussed with patient's wife  All of the above findings and recommendations were discussed with the patient's family and the ICU team, and all of patient's wife's questions  were answered to her expressed satisfaction.  -- Scherrie GerlachJason E. Earlene Plateravis, MD, RPVI [place]: [facility] - Partnering for exceptional care. Office: [phone]

## 2017-08-18 NOTE — Anesthesia Postprocedure Evaluation (Signed)
Anesthesia Post Note  Patient: Cory Graham  Procedure(s) Performed: ABDOMINAL WASHOUT WITH WOUND VAC dressing change (N/A )  Patient location during evaluation: ICU Anesthesia Type: General Level of consciousness: sedated and patient remains intubated per anesthesia plan Pain management: pain level controlled Vital Signs Assessment: post-procedure vital signs reviewed and stable Respiratory status: patient on ventilator - see flowsheet for VS Cardiovascular status: stable Anesthetic complications: no     Last Vitals:  Vitals:   08/18/17 0500 08/18/17 0600  BP: (!) 100/50 (!) 99/52  Pulse: 75 70  Resp: 16 18  Temp:    SpO2: 98% 98%    Last Pain:  Vitals:   08/18/17 0400  TempSrc: Axillary  PainSc:                  Zachary GeorgeWeatherly,  Darryon Bastin F

## 2017-08-18 NOTE — Consult Note (Signed)
Cory Graham, Cory Graham [id number] [birth year] Cory Graham  Reason for Consult: need to long term ventilatory support  HPI: 57 y.o. Male with history of perforation of abdoment s/p fall with now necrosis of abdomen and needing multiple surgeries.  Asked to evaluate for potential placement of tracheostomy tube given need for long term ventilatory support.  Allergies: No Known Allergies  ROS: Review of systems normal other than 12 systems except per HPI.  PMH: History reviewed. No pertinent past medical history.  FH: History reviewed. No pertinent family history.  SH:  Social History   Socioeconomic History  . Marital status: Married    Spouse name: Not on file  . Number of children: Not on file  . Years of education: Not on file  . Highest education level: Not on file  Occupational History  . Not on file  Social Needs  . Financial resource strain: Not on file  . Food insecurity:    Worry: Not on file    Inability: Not on file  . Transportation needs:    Medical: Not on file    Non-medical: Not on file  Tobacco Use  . Smoking status: Not on file  Substance and Sexual Activity  . Alcohol use: Not on file  . Drug use: Not on file  . Sexual activity: Not on file  Lifestyle  . Physical activity:    Days per week: Not on file    Minutes per session: Not on file  . Stress: Not on file  Relationships  . Social connections:    Talks on phone: Not on file    Gets together: Not on file    Attends religious service: Not on file    Active member of club or organization: Not on file    Attends meetings of clubs or organizations: Not on file    Relationship status: Not on file  . Intimate partner violence:    Fear of current or ex partner: Not on file    Emotionally abused: Not on file    Physically abused: Not on file    Forced sexual activity: Not on file  Other Topics Concern  . Not on file  Social History Narrative  . Not on file    PSH:  Past Surgical History:   Procedure Laterality Date  . APPLICATION OF WOUND VAC N/A 08/25/2017   Procedure: APPLICATION OF WOUND VAC;  Surgeon: Lattie Haw, MD;  Location: [facility] ORS;  Service: General;  Laterality: N/A;  . LAPAROTOMY N/A 08/28/2017   Procedure: EXPLORATORY LAPAROTOMY, removal of abdomen, peritoneal lavage, umbilical hernia repair;  Surgeon: Carolan Shiver, MD;  Location: [facility] ORS;  Service: General;  Laterality: N/A;  . LAPAROTOMY N/A 08/09/2017   Procedure: EXPLORATORY LAPAROTOMY,re-exploration of recent laparotomy,abdominal wound washout, creation ileostomy;  Surgeon: Ancil Linsey, MD;  Location: [facility] ORS;  Service: General;  Laterality: N/A;  . OSTOMY N/A 08/20/2017   Procedure: OSTOMY;  Surgeon: Ancil Linsey, MD;  Location: [facility] OR[facility];  Service: General;  Laterality: N/A;  . WOUND DEBRIDEMENT N/A 08/16/2017   Procedure: DEBRIDEMENT ABDOMINAL WOUND;  Surgeon: Lattie Haw, MD;  Location: [facility] ORS;  Service: General;  Laterality: N/A;  . WOUND DEBRIDEMENT N/A 08/23/2017   Procedure: ABDOMINAL WASHOUT WITH WOUND VAC dressing change;  Surgeon: Leafy Ro, MD;  Location: [facility] OR[facility];  Service: General;  Laterality: N/A;    Physical  Exam:  GEN-  Sedated and intubated EARS- external ears WNL NOSE-  Clear anteriorly OC/OP-  ETT  in place and secured NECK-  Short neck, but palpable landmarks anteriorly RESP-  Unlabored on vent CARD- RRR  A/P: Need for long term ventilatory support  Plan:  Patient is tracheostomy candidate.  Tentatively place on schedule for Thursday a.m. If family wishes to proceed.   Cory Graham 08/18/2017 12:10 PM

## 2017-08-18 NOTE — Progress Notes (Signed)
CC follow up resp failure  HPI  This 57 year old gentleman remains critically ill on mechanical ventilation. He is POD #0 of reopening of laparotomy, removal of retention sutures, excisional debridement of abdominal wall ( muscle, fascia and skin) and replacement of wound vac.  POD #3 of debridement of subcutaneous tissue and fascia of abdominal wall wound. POD# 7 of laparotomy with diverting loop ileostomy and POD # 12 of exploratory laparotomy for treatment of perforated viscus.  He remains on mechanical ventilation.  On vasorpessors   Vitals:   08/18/17 0400 08/18/17 0413 08/18/17 0500 08/18/17 0600  BP: (!) 103/48  (!) 100/50 (!) 99/52  Pulse: 75  75 70  Resp: 18  16 18   Temp: 98 F (36.7 C)     TempSrc: Axillary     SpO2: 97%  98% 98%  Weight:  218 lb 0.6 oz (98.9 kg)    Height:      RA  Gen:GCS<8T comfortable on the vent HEENT: NCAT, sclerae white Neck: No JVD Lungs: No adventitious sounds anteriorly, no increase airway pressure Cardiovascular: Regular, no M Abdomen: Distended,  JPs x 4, ileostomy- pink and patent, wound vac in place Ext: 2+ upper extremities oedema Neuro: Sedated Skin: warm and dry  BMP Latest Ref Rng & Units 08/18/2017 08/05/2017 08/16/2017  Glucose 65 - 99 mg/dL 811(B151(H) 147(W120(H) 295(A123(H)  BUN 6 - 20 mg/dL 21(H67(H) 08(M58(H) 57(Q44(H)  Creatinine 0.61 - 1.24 mg/dL 4.69(G3.86(H) 2.95(M2.85(H) 8.41(L2.05(H)  Sodium 135 - 145 mmol/L 132(L) 139 142  Potassium 3.5 - 5.1 mmol/L 4.1 3.6 3.5  Chloride 101 - 111 mmol/L 105 110 112(H)  CO2 22 - 32 mmol/L 20(L) 22 24  Calcium 8.9 - 10.3 mg/dL 2.4(M6.8(L) 7.2(L) 6.8(L)    CBC Latest Ref Rng & Units 08/18/2017 08/18/2017 08/16/2017  WBC 3.8 - 10.6 K/uL 13.4(H) 8.3 9.1  Hemoglobin 13.0 - 18.0 g/dL 7.2(L) 7.6(L) 8.4(L)  Hematocrit 40.0 - 52.0 % 23.2(L) 23.2(L) 25.6(L)  Platelets 150 - 440 K/uL 403 374 393    CXR: No new film  IMPRESSION: 57 yo male with acute perforation viscus complicated by wound infections with multiple trips to OR for debridement  with severe resp failure and shock with acute renal failure c/w multiorgan failure  1.Respiratory Failure -continue Full MV support -continue Bronchodilator Therapy -Wean Fio2 and PEEP as tolerated  2. ABD-GI-Occult bowel perforation S/P reopening of laparotomy with removal of retention sutures, excisional debridement of abdominal wall and replacement of wound vac today.   3.Severe sepsis with shock on Levophed Peritonitis- GNB and GPC ( multiple diff organisms)- ID and sensi pending Continue Zosyn, fluconazole; Clindamycin and Vancomycin ABX added today  4.Acute Kidney Injury from ATN non oliguric Hypovolemia from 3 rd spacing  5.Nutrition On TPN Deconditioning Anemia of critical illness    Critical Care Time devoted to patient care services described in this note is 45 minutes.   Overall, patient is critically ill, prognosis is guarded.  Patient with Multiorgan failure and at high risk for cardiac arrest and death.    Lucie LeatherKurian David Glynda Soliday, M.D.  [facility]  Medical Director [facility] Medical Director [facility]

## 2017-08-18 NOTE — Progress Notes (Signed)
[place] INFECTIOUS DISEASE PROGRESS NOTE Date of Admission:  09/02/2017     ID: Cory Graham is a 57 y.o. male with abd sepsis Active Problems:   Bowel perforation ([place])   Open wound of abdomen   Necrotizing soft tissue infection  Subjective: Remains intubated, critically ill.    ROS  Unable to obtain Medications:  Antibiotics Given (last 72 hours)    Date/Time Action Medication Dose Rate   08/15/17 1519 New Bag/Given   piperacillin-tazobactam (ZOSYN) IVPB 3.375 g 3.375 g 12.5 mL/hr   08/15/17 2212 New Bag/Given   piperacillin-tazobactam (ZOSYN) IVPB 3.375 g 3.375 g 12.5 mL/hr   08/16/17 0624 New Bag/Given   piperacillin-tazobactam (ZOSYN) IVPB 3.375 g 3.375 g 12.5 mL/hr   08/16/17 1550 New Bag/Given   piperacillin-tazobactam (ZOSYN) IVPB 3.375 g 3.375 g 12.5 mL/hr   08/16/17 2148 New Bag/Given   piperacillin-tazobactam (ZOSYN) IVPB 3.375 g 3.375 g 12.5 mL/hr   08/22/2017 0507 New Bag/Given   piperacillin-tazobactam (ZOSYN) IVPB 3.375 g 3.375 g 12.5 mL/hr   08/29/2017 1240 New Bag/Given   vancomycin (VANCOCIN) 1,500 mg in sodium chloride 0.9 % 500 mL IVPB 1,500 mg 250 mL/hr   08/31/2017 1345 New Bag/Given   clindamycin (CLEOCIN) IVPB 600 mg 600 mg 100 mL/hr   08/28/2017 1440 New Bag/Given   piperacillin-tazobactam (ZOSYN) IVPB 3.375 g 3.375 g 12.5 mL/hr   08/30/2017 2210 New Bag/Given   clindamycin (CLEOCIN) IVPB 600 mg 600 mg 100 mL/hr   08/11/2017 2247 New Bag/Given   piperacillin-tazobactam (ZOSYN) IVPB 3.375 g 3.375 g 12.5 mL/hr   08/18/17 0507 New Bag/Given   piperacillin-tazobactam (ZOSYN) IVPB 3.375 g 3.375 g 12.5 mL/hr   08/18/17 0507 New Bag/Given   clindamycin (CLEOCIN) IVPB 600 mg 600 mg 100 mL/hr     . chlorhexidine gluconate (MEDLINE KIT)  15 mL Mouth Rinse BID  . heparin injection (subcutaneous)  5,000 Units Subcutaneous Q8H  . insulin aspart  0-15 Units Subcutaneous Q6H  . insulin glargine  10 Units Subcutaneous Daily  . mouth rinse  15 mL Mouth Rinse 10  times per day  . sodium chloride flush  10-40 mL Intracatheter Q12H    Objective: Vital signs in last 24 hours: Temp:  [98 F (36.7 C)-100.3 F (37.9 C)] 100 F (37.8 C) (04/15 1200) Pulse Rate:  [70-81] 77 (04/15 1230) Resp:  [11-23] 15 (04/15 1230) BP: (91-114)/(44-65) 100/48 (04/15 1230) SpO2:  [95 %-100 %] 96 % (04/15 1230) FiO2 (%):  [40 %] 40 % (04/15 1200) Weight:  [98.9 kg (218 lb 0.6 oz)] 98.9 kg (218 lb 0.6 oz) (04/15 0413) Constitutional: intubated, sedated, critically ill appearing HENT: anicteric Mouth/Throat: ETT in place Cardiovascular: Normal rate, regular rhythm and normal heart sounds. Pulmonary/Chest: Effort normal and breath sounds normal. No respiratory distress.  Abdominal: Soft. Distended, midline incision site has large wound ccovered with wound vac Lymphadenopathy: He has no cervical adenopathy.  Neurological: intubated, sedated Et 1+ edema bil LE Skin: Skin is warm and dry. No rash noted. No erythema.  Psychiatric:unable to obtain    Lab Results Recent Labs    09/02/2017 0415 08/22/2017 0446 08/18/17 0414  WBC  --  8.3 13.4*  HGB  --  7.6* 7.2*  HCT  --  23.2* 23.2*  NA 139  --  132*  K 3.6  --  4.1  CL 110  --  105  CO2 22  --  20*  BUN 58*  --  67*  CREATININE 2.85*  --  3.86*  Microbiology: Results for orders placed or performed during the hospital encounter of 08/15/2017  Aerobic/Anaerobic Culture (surgical/deep wound)     Status: Abnormal   Collection Time: 08/05/17 12:17 AM  Result Value Ref Range Status   Specimen Description   Final    PERITONEAL Performed at [facility], [address], [place], [place] [address]    Special Requests   Final    NONE Performed at [facility], [place], [place] [address]    Gram Stain   Final    FEW WBC PRESENT, PREDOMINANTLY PMN ABUNDANT GRAM NEGATIVE RODS ABUNDANT GRAM POSITIVE COCCI MODERATE GRAM POSITIVE RODS Performed at [facility], [place] [address], [place], [place] [address]    Culture (A)  Final    MULTIPLE ORGANISMS PRESENT, NONE PREDOMINANT MIXED ANAEROBIC FLORA PRESENT.  CALL LAB IF FURTHER IID REQUIRED.    Report Status 08/22/2017 FINAL  Final  MRSA PCR Screening     Status: None   Collection Time: 08/05/17  4:08 AM  Result Value Ref Range Status   MRSA by PCR NEGATIVE NEGATIVE Final    Comment:        The GeneXpert MRSA Assay (FDA approved for NASAL specimens only), is one component of a comprehensive MRSA colonization surveillance program. It is not intended to diagnose MRSA infection nor to guide or monitor treatment for MRSA infections. Performed at [facility], [address] [place], [place] [address]   CULTURE, BLOOD (ROUTINE X 2) w Reflex to ID Panel     Status: None   Collection Time: 08/06/17 10:03 AM  Result Value Ref Range Status   Specimen Description BLOOD LEFT HAND  Final   Special Requests   Final    BOTTLES DRAWN AEROBIC AND ANAEROBIC Blood Culture results may not be optimal due to an excessive volume of blood received in culture bottles   Culture   Final    NO GROWTH 5 DAYS Performed at [facility], [address] [place], [place] [address]    Report Status 08/11/2017 FINAL  Final  CULTURE, BLOOD (ROUTINE X 2) w Reflex to ID Panel     Status: None   Collection Time: 08/06/17 10:03 AM  Result Value Ref Range Status   Specimen Description BLOOD LEFT WRIST  Final   Special Requests   Final    BOTTLES DRAWN AEROBIC AND ANAEROBIC Blood Culture adequate volume   Culture   Final    NO GROWTH 5 DAYS Performed at [facility], [address], [place], [place] [address]    Report Status 08/11/2017 FINAL  Final  Urine Culture     Status: None   Collection Time: 08/06/17 10:25 AM  Result Value Ref Range Status   Specimen Description   Final    URINE, RANDOM Performed at [facility], [address], [place], [place] [address]    Special Requests    Final    NONE Performed at [facility], [address], [place], [place] [address]    Culture   Final    NO GROWTH Performed at [facility], [place] [address] [place], [place] [address]    Report Status 08/07/2017 FINAL  Final  CULTURE, BLOOD (ROUTINE X 2) w Reflex to ID Panel     Status: None   Collection Time: 08/08/17 11:26 AM  Result Value Ref Range Status   Specimen Description BLOOD RIGHT WRIST  Final   Special Requests  IN PEDIATRIC BOTTLE Blood Culture adequate volume  Final   Culture   Final    NO GROWTH 5 DAYS Performed at [facility], [address] [place], [place] [address]    Report Status 08/13/2017 FINAL  Final  Culture, respiratory (NON-Expectorated)     Status: None   Collection Time: 08/05/2017  5:35 AM  Result Value Ref Range Status   Specimen Description   Final    TRACHEAL ASPIRATE Performed at [facility], [address] [place], [place] [address]    Special Requests   Final    NONE Performed at [facility], [address] [place], [place] [address]    Gram Stain   Final    RARE WBC PRESENT, PREDOMINANTLY PMN NO SQUAMOUS EPITHELIAL CELLS SEEN NO ORGANISMS SEEN Performed at [facility], [place] [address], [place], [place] [address]    Culture FEW CANDIDA ALBICANS  Final   Report Status 08/12/2017 FINAL  Final  Urine Culture     Status: None   Collection Time: 08/16/2017  5:37 AM  Result Value Ref Range Status   Specimen Description   Final    URINE, RANDOM Performed at [facility], [address], [place], [place] [address]    Special Requests   Final    NONE Performed at [facility], [address], [place], [place] [address]    Culture   Final    NO GROWTH Performed at [facility], [place] [address] [place], [place] [address]    Report Status 08/11/2017 FINAL  Final  Culture, blood (Routine X 2) w Reflex to ID Panel     Status: None   Collection Time: 08/24/2017   7:13 AM  Result Value Ref Range Status   Specimen Description BLOOD PORT  Final   Special Requests   Final    BOTTLES DRAWN AEROBIC AND ANAEROBIC Blood Culture results may not be optimal due to an excessive volume of blood received in culture bottles   Culture   Final    NO GROWTH 5 DAYS Performed at [facility], [address] [place], [place] [address]    Report Status 08/15/2017 FINAL  Final  Culture, blood (Routine X 2) w Reflex to ID Panel     Status: None   Collection Time: 08/08/2017  7:23 AM  Result Value Ref Range Status   Specimen Description BLOOD LT HAND  Final   Special Requests   Final    BOTTLES DRAWN AEROBIC AND ANAEROBIC Blood Culture results may not be optimal due to an inadequate volume of blood received in culture bottles   Culture   Final    NO GROWTH 5 DAYS Performed at [facility], [address], [place], [place] [address]    Report Status 08/15/2017 FINAL  Final  Aerobic/Anaerobic Culture (surgical/deep wound)     Status: Abnormal (Preliminary result)   Collection Time: 09/01/2017 10:47 PM  Result Value Ref Range Status   Specimen Description   Final    WOUND ABD3 Performed at [facility], [place] [address], [place], [place] [address]    Special Requests   Final    NONE Performed at [facility], [place], [place] [address]    Gram Stain   Final    FEW WBC PRESENT, PREDOMINANTLY PMN ABUNDANT GRAM NEGATIVE RODS ABUNDANT GRAM POSITIVE COCCI IN PAIRS MODERATE GRAM POSITIVE RODS Performed at [facility], [address] [place], [place] [address]    Culture MULTIPLE ORGANISMS PRESENT, NONE PREDOMINANT (A)  Final   Report Status PENDING  Incomplete  Aerobic/Anaerobic Culture (surgical/deep wound)     Status: None (Preliminary result)   Collection Time: 08/08/2017 10:57 PM  Result Value Ref Range Status   Specimen Description   Final    ABDOMEN 2 Performed at [facility], [place] [address],  [place], [place] [address]    Special Requests   Final    NONE Performed at [facility], [address] [place], [place] [address]    Gram Stain   Final    FEW WBC PRESENT, PREDOMINANTLY PMN MODERATE GRAM NEGATIVE RODS MODERATE GRAM POSITIVE COCCI IN PAIRS RARE GRAM POSITIVE RODS    Culture   Final    ABUNDANT ESCHERICHIA COLI Confirmed Extended Spectrum Beta-Lactamase Producer (ESBL).  In bloodstream infections from ESBL organisms, carbapenems are preferred over piperacillin/tazobactam. They are shown to have a lower risk of mortality. Performed at [facility], [place] [address], [place], [place] [address]    Report Status PENDING  Incomplete   Organism ID, Bacteria ESCHERICHIA COLI  Final      Susceptibility   Escherichia coli - MIC*    AMPICILLIN >=32 RESISTANT Resistant     CEFAZOLIN >=64 RESISTANT Resistant     CEFEPIME RESISTANT Resistant     CEFTAZIDIME RESISTANT Resistant     CEFTRIAXONE >=64 RESISTANT Resistant     CIPROFLOXACIN 1 SENSITIVE Sensitive     GENTAMICIN <=1 SENSITIVE Sensitive     IMIPENEM <=0.25 SENSITIVE Sensitive     TRIMETH/SULFA <=20 SENSITIVE Sensitive     AMPICILLIN/SULBACTAM >=32 RESISTANT Resistant     PIP/TAZO <=4 SENSITIVE Sensitive     Extended ESBL POSITIVE Resistant     * ABUNDANT ESCHERICHIA COLI    Studies/Results: Dg Chest Port 1 View  Result Date: 08/18/2017 CLINICAL DATA:  57 year old male with a history of respiratory failure EXAM: PORTABLE CHEST 1 VIEW COMPARISON:  08/16/2017, 08/23/2017 FINDINGS: Cardiomediastinal silhouette unchanged, partially obscured by overlying lung/pleural disease. Obscuration the right hemidiaphragm with opacity at the right base partially obscuring the right heart border. Thickening of the minor fissure. Opacity in the retrocardiac region without significant blunting of the left costophrenic angle. No pneumothorax. Unchanged position of the endotracheal tube terminating 3.5 cm above the carina.  Unchanged position of left upper extremity PICC. Unchanged gastric tube terminating below the field of view. IMPRESSION: Similar appearance of the chest x-ray with bibasilar, right greater than left opacity likely a combination of pleural fluid, atelectasis/consolidation, and edema. Unchanged endotracheal tube, gastric tube, and left upper extremity PICC Electronically Signed   By: Corrie Mckusick D.O.   On: 08/18/2017 07:45    Assessment/Plan: Cory Graham is a 57 y.o. male with no medical history admitted with 3-5 days abd pain and found to have Pneumoperitoneum, sepsis, now s/p ex lap with purulent findings but no obvious source, culture mixed. Critically ill, on pressors, intubated. ARF and elevated LFTs.   4/15- remains critically ill, had repeat ex lap, and diverting loop ileostomy on 4/7. Copious loose feces found.  Then 4/11 had foul smelling odor and necrosis from abd wall tissue. Had repeat surgery and resection of wound.  4/14 had reopening and excisional debridement with placement of wound vac.  Cx now with ESBL E coli  Recommendations Change zosyn to meropenem given the ESBL. Despite being reported as sensitive to zosyn, meropenem will provide more reliable coverage.  Added vanco and clinda 4/14 Can continue antifungal coverage with fluconazole  Thank you very much for the consult. Will follow with you.  Leonel Ramsay   08/18/2017, 1:08 PM

## 2017-08-18 NOTE — Progress Notes (Signed)
Pharmacy Antibiotic Note  Cory Graham is a 57 y.o. male admitted on 08/29/2017 with bowel perforation s/p exploratory laparotomy. Patient returned to OR on 4/11 and is now requiring mechanical ventilation. Patient has been receiving Zosyn. Patient initially ordered anidulafungin. After discussion with Dr. Sampson GoonFitzgerald on 4/4, patient transition from anidulafungin to fluconazole. Based on would cultures, adding vancomycin and clindamycin. Patients creatinine clearance is continuing to decline    Plan: Add Meropenem 500mg  IV q12h  Height: 5\' 9"  (175.3 cm) Weight: 218 lb 0.6 oz (98.9 kg) IBW/kg (Calculated) : 70.7  Temp (24hrs), Avg:99.5 F (37.5 C), Min:98 F (36.7 C), Max:100.3 F (37.9 C)  Recent Labs  Lab 08/27/2017 0922 08/15/17 0029 08/15/17 1036 08/15/17 1147 08/16/17 0522 01-23-2018 0415 01-23-2018 0446 08/18/17 0414 08/18/17 1102  WBC  --  5.9 7.9  --  9.1  --  8.3 13.4*  --   CREATININE 1.02  --   --  1.78* 2.05* 2.85*  --  3.86*  --   LATICACIDVEN  --   --   --   --   --   --   --   --  0.9    Estimated Creatinine Clearance: 23.9 mL/min (A) (by C-G formula based on SCr of 3.86 mg/dL (H)).    No Known Allergies  Antimicrobials this admission: Zosyn 4/1 >> 4/15 vancomycin 4/4 >> 4/4 anidulafungin 4/4 x 1 Fluconazole 4/4 >> Clindamycin 4/14 >> Vancomycin 4/14 >>  Meropenem 4/15 >>   Dose adjustments this admission: 4/6 Zosyn transitioned to 4.5g IV Q8hr.  4/6 Fluconazole transitioned to 400mg  IV Q24hr.  4/11 Zosyn transitioned to 3.375g IV Q8hr.  4/13 Fluconazole transitioned to 200mg  IV Q24hr.     Microbiology results: 4/3 BCx: no growth x 5 days  4/5 BCx (only 1 set): no growth x 5 days  4/7 BCx: no growth x 5 day  4/2 Wound Cx: multiple organisms 4/3 UCx: no growth  4/7 UCx: no growth  4/7 Tracheal Aspirate: few candida albicans  4/2 MRSA PCR: negative 4/11 Wound Culture: gram negative rods, gram positive cocci in pairs, gram positive rods  4/12 Wound  Culture: ESBL E.coli  Thank you for allowing pharmacy to be a part of this patient's care.  Clovia CuffLisa Lenzie Sandler, PharmD, BCPS 08/18/2017 9:38 PM

## 2017-08-19 ENCOUNTER — Other Ambulatory Visit: Payer: Self-pay

## 2017-08-19 ENCOUNTER — Encounter: Admission: EM | Disposition: E | Payer: Self-pay | Source: Home / Self Care | Attending: Surgery

## 2017-08-19 ENCOUNTER — Encounter: Payer: Self-pay | Admitting: *Deleted

## 2017-08-19 LAB — TYPE AND SCREEN
ABO/RH(D): O POS
Antibody Screen: NEGATIVE
UNIT DIVISION: 0
Unit division: 0
Unit division: 0

## 2017-08-19 LAB — GLUCOSE, CAPILLARY
GLUCOSE-CAPILLARY: 151 mg/dL — AB (ref 65–99)
GLUCOSE-CAPILLARY: 151 mg/dL — AB (ref 65–99)
GLUCOSE-CAPILLARY: 152 mg/dL — AB (ref 65–99)
GLUCOSE-CAPILLARY: 165 mg/dL — AB (ref 65–99)
Glucose-Capillary: 177 mg/dL — ABNORMAL HIGH (ref 65–99)
Glucose-Capillary: 193 mg/dL — ABNORMAL HIGH (ref 65–99)

## 2017-08-19 LAB — CBC WITH DIFFERENTIAL/PLATELET
BASOS PCT: 1 %
Basophils Absolute: 0.2 10*3/uL — ABNORMAL HIGH (ref 0–0.1)
EOS PCT: 2 %
Eosinophils Absolute: 0.2 10*3/uL (ref 0–0.7)
HEMATOCRIT: 23.9 % — AB (ref 40.0–52.0)
Hemoglobin: 7.8 g/dL — ABNORMAL LOW (ref 13.0–18.0)
Lymphocytes Relative: 8 %
Lymphs Abs: 1.1 10*3/uL (ref 1.0–3.6)
MCH: 26.6 pg (ref 26.0–34.0)
MCHC: 32.4 g/dL (ref 32.0–36.0)
MCV: 82.1 fL (ref 80.0–100.0)
MONO ABS: 1.5 10*3/uL — AB (ref 0.2–1.0)
Monocytes Relative: 11 %
NEUTROS ABS: 10.7 10*3/uL — AB (ref 1.4–6.5)
Neutrophils Relative %: 78 %
Platelets: 383 10*3/uL (ref 150–440)
RBC: 2.91 MIL/uL — ABNORMAL LOW (ref 4.40–5.90)
RDW: 19.4 % — AB (ref 11.5–14.5)
WBC: 13.8 10*3/uL — ABNORMAL HIGH (ref 3.8–10.6)

## 2017-08-19 LAB — COMPREHENSIVE METABOLIC PANEL
ALK PHOS: 97 U/L (ref 38–126)
ALT: 7 U/L — AB (ref 17–63)
AST: 13 U/L — ABNORMAL LOW (ref 15–41)
Albumin: 1.4 g/dL — ABNORMAL LOW (ref 3.5–5.0)
Anion gap: 11 (ref 5–15)
BUN: 78 mg/dL — ABNORMAL HIGH (ref 6–20)
CO2: 15 mmol/L — ABNORMAL LOW (ref 22–32)
CREATININE: 5.03 mg/dL — AB (ref 0.61–1.24)
Calcium: 6.9 mg/dL — ABNORMAL LOW (ref 8.9–10.3)
Chloride: 100 mmol/L — ABNORMAL LOW (ref 101–111)
GFR, EST AFRICAN AMERICAN: 13 mL/min — AB (ref >60)
GFR, EST NON AFRICAN AMERICAN: 11 mL/min — AB (ref >60)
Glucose, Bld: 189 mg/dL — ABNORMAL HIGH (ref 65–99)
Potassium: 3.9 mmol/L (ref 3.5–5.1)
Sodium: 126 mmol/L — ABNORMAL LOW (ref 135–145)
Total Bilirubin: 1.8 mg/dL — ABNORMAL HIGH (ref 0.3–1.2)
Total Protein: 5.5 g/dL — ABNORMAL LOW (ref 6.5–8.1)

## 2017-08-19 LAB — VANCOMYCIN, RANDOM: VANCOMYCIN RM: 21

## 2017-08-19 LAB — BPAM RBC
BLOOD PRODUCT EXPIRATION DATE: 201904232359
Blood Product Expiration Date: 201905082359
Blood Product Expiration Date: 201905082359
ISSUE DATE / TIME: 201904120655
ISSUE DATE / TIME: 201904120507
ISSUE DATE / TIME: 201904151309
UNIT TYPE AND RH: 5100
UNIT TYPE AND RH: 5100
Unit Type and Rh: 5100

## 2017-08-19 LAB — MAGNESIUM: Magnesium: 1.6 mg/dL — ABNORMAL LOW (ref 1.7–2.4)

## 2017-08-19 LAB — TRIGLYCERIDES: Triglycerides: 163 mg/dL — ABNORMAL HIGH (ref <150)

## 2017-08-19 LAB — PHOSPHORUS: PHOSPHORUS: 7.1 mg/dL — AB (ref 2.5–4.6)

## 2017-08-19 SURGERY — IRRIGATION AND DEBRIDEMENT ABDOMEN
Anesthesia: Choice

## 2017-08-19 MED ORDER — HYDROCORTISONE NA SUCCINATE PF 100 MG IJ SOLR
50.0000 mg | Freq: Four times a day (QID) | INTRAMUSCULAR | Status: DC
  Administered 2017-08-19 – 2017-08-20 (×4): 50 mg via INTRAVENOUS
  Filled 2017-08-19 (×4): qty 2

## 2017-08-19 MED ORDER — SODIUM CHLORIDE 0.9 % IV SOLN
1.0000 g | Freq: Two times a day (BID) | INTRAVENOUS | Status: DC
  Administered 2017-08-19 – 2017-08-20 (×2): 1 g via INTRAVENOUS
  Filled 2017-08-19 (×3): qty 1

## 2017-08-19 MED ORDER — NOREPINEPHRINE BITARTRATE 1 MG/ML IV SOLN
0.0000 ug/min | INTRAVENOUS | Status: DC
  Administered 2017-08-19: 8 ug/min via INTRAVENOUS
  Filled 2017-08-19: qty 16

## 2017-08-19 MED ORDER — TRACE MINERALS CR-CU-MN-SE-ZN 10-1000-500-60 MCG/ML IV SOLN
INTRAVENOUS | Status: DC
  Administered 2017-08-19: 18:00:00 via INTRAVENOUS
  Filled 2017-08-19: qty 2000

## 2017-08-19 MED ORDER — VANCOMYCIN HCL IN DEXTROSE 1-5 GM/200ML-% IV SOLN
1000.0000 mg | INTRAVENOUS | Status: DC
  Filled 2017-08-19: qty 200

## 2017-08-19 MED ORDER — INSULIN ASPART 100 UNIT/ML ~~LOC~~ SOLN
2.0000 [IU] | SUBCUTANEOUS | Status: DC
  Administered 2017-08-19 (×3): 4 [IU] via SUBCUTANEOUS
  Administered 2017-08-20 (×3): 6 [IU] via SUBCUTANEOUS
  Administered 2017-08-20: 4 [IU] via SUBCUTANEOUS
  Filled 2017-08-19 (×7): qty 1

## 2017-08-19 MED ORDER — FAMOTIDINE IN NACL 20-0.9 MG/50ML-% IV SOLN: 20.0000 mg | INTRAVENOUS | Status: DC

## 2017-08-19 MED ORDER — MIDAZOLAM HCL 2 MG/2ML IJ SOLN
1.0000 mg | INTRAMUSCULAR | Status: DC | PRN
  Administered 2017-08-19: 2 mg via INTRAVENOUS
  Administered 2017-08-19: 1 mg via INTRAVENOUS
  Administered 2017-08-20 (×2): 2 mg via INTRAVENOUS
  Filled 2017-08-19 (×4): qty 2

## 2017-08-19 MED ORDER — VANCOMYCIN HCL IN DEXTROSE 1-5 GM/200ML-% IV SOLN: 1000.0000 mg | INTRAVENOUS | Status: DC

## 2017-08-19 MED ORDER — HYDROCORTISONE NA SUCCINATE PF 100 MG IJ SOLR
50.0000 mg | Freq: Four times a day (QID) | INTRAMUSCULAR | Status: DC
  Administered 2017-08-19: 50 mg via INTRAVENOUS
  Filled 2017-08-19: qty 2

## 2017-08-19 MED ORDER — MAGNESIUM SULFATE IN D5W 1-5 GM/100ML-% IV SOLN
1.0000 g | Freq: Once | INTRAVENOUS | Status: AC
  Administered 2017-08-19: 1 g via INTRAVENOUS
  Filled 2017-08-19: qty 100

## 2017-08-19 NOTE — Progress Notes (Signed)
Consultation was received from critical care.  Case discussed with Dr. Belia HemanKasa.  He will call us back when and if CRRT needed.  Dr. Belia HemanKasa to have discussion with the family regarding additional goals of care including the potential for renal replacement therapy.  Overall prognosis appears to be quite guarded.

## 2017-08-19 NOTE — Progress Notes (Signed)
[place] INFECTIOUS DISEASE PROGRESS NOTE Date of Admission:  08/18/2017     ID: Clois Treanor is a 57 y.o. male with abd sepsis Active Problems:   Bowel perforation ([place])   Open wound of abdomen   Necrotizing soft tissue infection  Subjective: Remains intubated, critically ill.  Intubated still and on pressors.  ROS  Unable to obtain Medications:  Antibiotics Given (last 72 hours)    Date/Time Action Medication Dose Rate   08/16/17 1550 New Bag/Given   piperacillin-tazobactam (ZOSYN) IVPB 3.375 g 3.375 g 12.5 mL/hr   08/16/17 2148 New Bag/Given   piperacillin-tazobactam (ZOSYN) IVPB 3.375 g 3.375 g 12.5 mL/hr   08/25/2017 0507 New Bag/Given   piperacillin-tazobactam (ZOSYN) IVPB 3.375 g 3.375 g 12.5 mL/hr   08/13/2017 1240 New Bag/Given   vancomycin (VANCOCIN) 1,500 mg in sodium chloride 0.9 % 500 mL IVPB 1,500 mg 250 mL/hr   08/11/2017 1345 New Bag/Given   clindamycin (CLEOCIN) IVPB 600 mg 600 mg 100 mL/hr   08/18/2017 1440 New Bag/Given   piperacillin-tazobactam (ZOSYN) IVPB 3.375 g 3.375 g 12.5 mL/hr   08/23/2017 2210 New Bag/Given   clindamycin (CLEOCIN) IVPB 600 mg 600 mg 100 mL/hr   08/08/2017 2247 New Bag/Given   piperacillin-tazobactam (ZOSYN) IVPB 3.375 g 3.375 g 12.5 mL/hr   08/18/17 0507 New Bag/Given   piperacillin-tazobactam (ZOSYN) IVPB 3.375 g 3.375 g 12.5 mL/hr   08/18/17 0507 New Bag/Given   clindamycin (CLEOCIN) IVPB 600 mg 600 mg 100 mL/hr   08/18/17 1550 New Bag/Given   piperacillin-tazobactam (ZOSYN) IVPB 3.375 g 3.375 g 12.5 mL/hr   08/18/17 1550 New Bag/Given   clindamycin (CLEOCIN) IVPB 600 mg 600 mg 100 mL/hr   08/18/17 1640 New Bag/Given   vancomycin (VANCOCIN) IVPB 1000 mg/200 mL premix 1,000 mg 200 mL/hr   08/18/17 2153 New Bag/Given   clindamycin (CLEOCIN) IVPB 600 mg 600 mg 100 mL/hr   08/18/17 2230 New Bag/Given   meropenem (MERREM) 500 mg in sodium chloride 0.9 % 100 mL IVPB 500 mg 200 mL/hr   08/31/2017 0534 New Bag/Given   clindamycin (CLEOCIN)  IVPB 600 mg 600 mg 100 mL/hr   08/07/2017 0935 New Bag/Given   meropenem (MERREM) 500 mg in sodium chloride 0.9 % 100 mL IVPB 500 mg 200 mL/hr     . chlorhexidine gluconate (MEDLINE KIT)  15 mL Mouth Rinse BID  . heparin injection (subcutaneous)  5,000 Units Subcutaneous Q8H  . hydrocortisone sod succinate (SOLU-CORTEF) inj  50 mg Intravenous Q6H  . insulin aspart  2-6 Units Subcutaneous Q4H  . insulin glargine  10 Units Subcutaneous Daily  . mouth rinse  15 mL Mouth Rinse 10 times per day  . sodium chloride flush  10-40 mL Intracatheter Q12H    Objective: Vital signs in last 24 hours: Temp:  [99.4 F (37.4 C)-99.8 F (37.7 C)] 99.8 F (37.7 C) (04/16 0500) Pulse Rate:  [68-99] 99 (04/16 1245) Resp:  [14-24] 20 (04/16 1245) BP: (97-117)/(45-56) 112/46 (04/16 0900) SpO2:  [95 %-100 %] 98 % (04/16 1245) FiO2 (%):  [35 %-40 %] 35 % (04/16 1245) Weight:  [100.1 kg (220 lb 10.9 oz)] 100.1 kg (220 lb 10.9 oz) (04/16 0451) Constitutional: intubated, sedated, critically ill appearing NGT HENT: anicteric Mouth/Throat: ETT in place Cardiovascular: Normal rate, regular rhythm and normal heart sounds. Pulmonary/Chest: Effort normal and breath sounds normal. No respiratory distress.  Abdominal: Soft. Distended, midline incision site has large wound ccovered with wound vac PICC RUE Lymphadenopathy: He has no cervical  adenopathy.  Neurological: intubated, sedated Et 1+ edema bil LE Skin: Skin is warm and dry. No rash noted. No erythema.  Psychiatric:unable to obtain    Lab Results Recent Labs    08/18/17 0414 08/08/2017 0541  WBC 13.4* 13.8*  HGB 7.2* 7.8*  HCT 23.2* 23.9*  NA 132* 126*  K 4.1 3.9  CL 105 100*  CO2 20* 15*  BUN 67* 78*  CREATININE 3.86* 5.03*    Microbiology: Results for orders placed or performed during the hospital encounter of 08/15/2017  Aerobic/Anaerobic Culture (surgical/deep wound)     Status: Abnormal   Collection Time: 08/05/17 12:17 AM  Result Value  Ref Range Status   Specimen Description   Final    PERITONEAL Performed at [facility], [address] [place], [place] [address]    Special Requests   Final    NONE Performed at [facility], [address] [place], [place] [address]    Gram Stain   Final    FEW WBC PRESENT, PREDOMINANTLY PMN ABUNDANT GRAM NEGATIVE RODS ABUNDANT GRAM POSITIVE COCCI MODERATE GRAM POSITIVE RODS Performed at [facility], [place] [address] [place], [place] [address]    Culture (A)  Final    MULTIPLE ORGANISMS PRESENT, NONE PREDOMINANT MIXED ANAEROBIC FLORA PRESENT.  CALL LAB IF FURTHER IID REQUIRED.    Report Status 09/01/2017 FINAL  Final  MRSA PCR Screening     Status: None   Collection Time: 08/05/17  4:08 AM  Result Value Ref Range Status   MRSA by PCR NEGATIVE NEGATIVE Final    Comment:        The GeneXpert MRSA Assay (FDA approved for NASAL specimens only), is one component of a comprehensive MRSA colonization surveillance program. It is not intended to diagnose MRSA infection nor to guide or monitor treatment for MRSA infections. Performed at [facility], [address] [place], [place] [address]   CULTURE, BLOOD (ROUTINE X 2) w Reflex to ID Panel     Status: None   Collection Time: 08/06/17 10:03 AM  Result Value Ref Range Status   Specimen Description BLOOD LEFT HAND  Final   Special Requests   Final    BOTTLES DRAWN AEROBIC AND ANAEROBIC Blood Culture results may not be optimal due to an excessive volume of blood received in culture bottles   Culture   Final    NO GROWTH 5 DAYS Performed at [facility], [address] [place], [place] [address]    Report Status 08/11/2017 FINAL  Final  CULTURE, BLOOD (ROUTINE X 2) w Reflex to ID Panel     Status: None   Collection Time: 08/06/17 10:03 AM  Result Value Ref Range Status   Specimen Description BLOOD LEFT WRIST  Final   Special Requests   Final    BOTTLES DRAWN AEROBIC AND  ANAEROBIC Blood Culture adequate volume   Culture   Final    NO GROWTH 5 DAYS Performed at [facility], [address], [place], [place] [address]    Report Status 08/11/2017 FINAL  Final  Urine Culture     Status: None   Collection Time: 08/06/17 10:25 AM  Result Value Ref Range Status   Specimen Description   Final    URINE, RANDOM Performed at [facility], [address], [place], [place] [address]    Special Requests   Final    NONE Performed at [facility], [address] [place], [place]  [address]    Culture   Final    NO GROWTH Performed at [facility], [place] [address], [place], [place] [address]    Report Status 08/07/2017 FINAL  Final  CULTURE, BLOOD (ROUTINE X 2) w Reflex to ID Panel     Status: None   Collection Time: 08/08/17 11:26 AM  Result Value Ref Range Status   Specimen Description BLOOD RIGHT WRIST  Final   Special Requests IN PEDIATRIC BOTTLE Blood Culture adequate volume  Final   Culture   Final    NO GROWTH 5 DAYS Performed at [facility], [address], [place], [place] [address]    Report Status 08/13/2017 FINAL  Final  Culture, respiratory (NON-Expectorated)     Status: None   Collection Time: 08/30/2017  5:35 AM  Result Value Ref Range Status   Specimen Description   Final    TRACHEAL ASPIRATE Performed at [facility], [address] [place], [place] [address]    Special Requests   Final    NONE Performed at [facility], [address] [place], [place] [address]    Gram Stain   Final    RARE WBC PRESENT, PREDOMINANTLY PMN NO SQUAMOUS EPITHELIAL CELLS SEEN NO ORGANISMS SEEN Performed at [facility], [place] [address], [place], [place] [address]    Culture FEW CANDIDA ALBICANS  Final   Report Status 08/12/2017 FINAL  Final  Urine Culture     Status: None   Collection Time: 08/11/2017  5:37 AM  Result Value Ref Range Status   Specimen Description   Final    URINE,  RANDOM Performed at [facility], [address] [place], [place] [address]    Special Requests   Final    NONE Performed at [facility], [address] [place], [place] [address]    Culture   Final    NO GROWTH Performed at [facility], [place] [address], [place], [place] [address]    Report Status 08/11/2017 FINAL  Final  Culture, blood (Routine X 2) w Reflex to ID Panel     Status: None   Collection Time: 08/31/2017  7:13 AM  Result Value Ref Range Status   Specimen Description BLOOD PORT  Final   Special Requests   Final    BOTTLES DRAWN AEROBIC AND ANAEROBIC Blood Culture results may not be optimal due to an excessive volume of blood received in culture bottles   Culture   Final    NO GROWTH 5 DAYS Performed at [facility], [address] [place], [place] [address]    Report Status 08/15/2017 FINAL  Final  Culture, blood (Routine X 2) w Reflex to ID Panel     Status: None   Collection Time: 08/23/2017  7:23 AM  Result Value Ref Range Status   Specimen Description BLOOD LT HAND  Final   Special Requests   Final    BOTTLES DRAWN AEROBIC AND ANAEROBIC Blood Culture results may not be optimal due to an inadequate volume of blood received in culture bottles   Culture   Final    NO GROWTH 5 DAYS Performed at [facility], [address] [place], [place] [address]    Report Status 08/15/2017 FINAL  Final  Aerobic/Anaerobic Culture (surgical/deep wound)     Status: Abnormal (Preliminary result)   Collection Time: 08/18/2017 10:47 PM  Result Value Ref Range Status   Specimen Description   Final  WOUND ABD3 Performed at [facility], [place] [address], [place], [place] [address]    Special Requests   Final    NONE Performed at [facility], [place], [place] [address]    Gram Stain   Final    FEW WBC PRESENT, PREDOMINANTLY PMN ABUNDANT GRAM NEGATIVE RODS ABUNDANT GRAM POSITIVE COCCI IN PAIRS MODERATE  GRAM POSITIVE RODS Performed at [facility], [place] [address] [place], [place] [address]    Culture (A)  Final    MULTIPLE ORGANISMS PRESENT, NONE PREDOMINANT NO ANAEROBES ISOLATED; CULTURE IN PROGRESS FOR 5 DAYS   Report Status PENDING  Incomplete  Aerobic/Anaerobic Culture (surgical/deep wound)     Status: None (Preliminary result)   Collection Time: 08/26/2017 10:57 PM  Result Value Ref Range Status   Specimen Description   Final    ABDOMEN 2 Performed at [facility], [place] [address], [place], [place] [address]    Special Requests   Final    NONE Performed at [facility], [place], [place] [address]    Gram Stain   Final    FEW WBC PRESENT, PREDOMINANTLY PMN MODERATE GRAM NEGATIVE RODS MODERATE GRAM POSITIVE COCCI IN PAIRS RARE GRAM POSITIVE RODS Performed at [facility], [place] [address], [place],  [address]    Culture   Final    ABUNDANT ESCHERICHIA COLI Confirmed Extended Spectrum Beta-Lactamase Producer (ESBL).  In bloodstream infections from ESBL organisms, carbapenems are preferred over piperacillin/tazobactam. They are shown to have a lower risk of mortality. FEW ACTINOMYCES ODONTOLYTICUS Standardized susceptibility testing for this organism is not available. NO ANAEROBES ISOLATED; CULTURE IN PROGRESS FOR 5 DAYS    Report Status PENDING  Incomplete   Organism ID, Bacteria ESCHERICHIA COLI  Final      Susceptibility   Escherichia coli - MIC*    AMPICILLIN >=32 RESISTANT Resistant     CEFAZOLIN >=64 RESISTANT Resistant     CEFEPIME RESISTANT Resistant     CEFTAZIDIME RESISTANT Resistant     CEFTRIAXONE >=64 RESISTANT Resistant     CIPROFLOXACIN 1 SENSITIVE Sensitive     GENTAMICIN <=1 SENSITIVE Sensitive     IMIPENEM <=0.25 SENSITIVE Sensitive     TRIMETH/SULFA <=20 SENSITIVE Sensitive     AMPICILLIN/SULBACTAM >=32 RESISTANT Resistant     PIP/TAZO <=4 SENSITIVE Sensitive     Extended ESBL POSITIVE Resistant     *  ABUNDANT ESCHERICHIA COLI    Studies/Results: Dg Chest Port 1 View  Result Date: 08/18/2017 CLINICAL DATA:  57 year old male with a history of respiratory failure EXAM: PORTABLE CHEST 1 VIEW COMPARISON:  08/16/2017, 08/25/2017 FINDINGS: Cardiomediastinal silhouette unchanged, partially obscured by overlying lung/pleural disease. Obscuration the right hemidiaphragm with opacity at the right base partially obscuring the right heart border. Thickening of the minor fissure. Opacity in the retrocardiac region without significant blunting of the left costophrenic angle. No pneumothorax. Unchanged position of the endotracheal tube terminating 3.5 cm above the carina. Unchanged position of left upper extremity PICC. Unchanged gastric tube terminating below the field of view. IMPRESSION: Similar appearance of the chest x-ray with bibasilar, right greater than left opacity likely a combination of pleural fluid, atelectasis/consolidation, and edema. Unchanged endotracheal tube, gastric tube, and left upper extremity PICC Electronically Signed   By: Corrie Mckusick D.O.   On: 08/18/2017 07:45    Assessment/Plan: Kaycee Mcgaugh is a 57 y.o. male with no medical history admitted with 3-5 days abd pain  and found to have Pneumoperitoneum, sepsis, now s/p ex lap with purulent findings but no obvious source, culture mixed. Critically ill, on pressors, intubated. ARF and elevated LFTs.   4/15- remains critically ill, had repeat ex lap, and diverting loop ileostomy on 4/7. Copious loose feces found.  Then 4/11 had foul smelling odor and necrosis from abd wall tissue. Had repeat surgery and resection of wound.  4/14 had reopening and excisional debridement with placement of wound vac.  Cx now with ESBL E coli  4/16 - remains critically ill. abd cx also with actinomyces in addition to ESBL E coli . On TPN, pressors. For repeat washout today  Recommendations Changed to  meropenem given the ESBL. Despite being reported as  sensitive to zosyn, meropenem will provide more reliable coverage.  Cont vanco added - 4/14  Cont clinida but tomorrow can stop it as worsening was likely due to the ESBL E coli as opposed to a toxin mediated necrotizing infection  Can continue antifungal coverage with fluconazole  Thank you very much for the consult. Will follow with you.  Leonel Ramsay   08/27/2017, 2:13 PM

## 2017-08-19 NOTE — Progress Notes (Signed)
Pharmacy Antibiotic Note  Cory Graham is a 57 y.o. male admitted on 09/02/2017 with bowel perforation s/p exploratory laparotomy. Patient returned to OR on 4/11 and is now requiring mechanical ventilation. Patient has been receiving Zosyn. Patient initially ordered anidulafungin. After discussion with Dr. Sampson GoonFitzgerald on 4/4, patient transition from anidulafungin to fluconazole. Based on would cultures, adding vancomycin and clindamycin. Patients creatinine clearance is continuing to decline.   Plan: Continue fluconazole to 200mg  IV Q24H.  Meropenem 1g IV Q12hr  Clindamycin 600mg  IV Q8hr.   Vancomycin Level is 21. Will schedule next vancomycin dose for 2200. Will monitor renal function daily.   Height: 5\' 9"  (175.3 cm) Weight: 220 lb 10.9 oz (100.1 kg) IBW/kg (Calculated) : 70.7  Temp (24hrs), Avg:98.9 F (37.2 C), Min:97.6 F (36.4 C), Max:99.8 F (37.7 C)  Recent Labs  Lab 08/15/17 1036 08/15/17 1147 08/16/17 0522 08/18/2017 0415 08/20/2017 0446 08/18/17 0414 08/18/17 1102 08/20/2017 0541 08/11/2017 1252  WBC 7.9  --  9.1  --  8.3 13.4*  --  13.8*  --   CREATININE  --  1.78* 2.05* 2.85*  --  3.86*  --  5.03*  --   LATICACIDVEN  --   --   --   --   --   --  0.9  --   --   VANCORANDOM  --   --   --   --   --   --   --   --  21    Estimated Creatinine Clearance: 18.5 mL/min (A) (by C-G formula based on SCr of 5.03 mg/dL (H)).    No Known Allergies  Antimicrobials this admission: Zosyn 4/1 >> 4/15 vancomycin 4/4 >> 4/4 anidulafungin 4/4 x 1 Fluconazole 4/4 >> Clindamycin 4/14 >> Vancomycin 4/14 >>  Meropenem 4/15  Dose adjustments this admission: 4/6 Zosyn transitioned to 4.5g IV Q8hr.  4/6 Fluconazole transitioned to 400mg  IV Q24hr.  4/11 Zosyn transitioned to 3.375g IV Q8hr.  4/13 Fluconazole transitioned to 200mg  IV Q24hr.  4/16 Meropenem transitioned to 1g IV Q12hr   Microbiology results: 4/3 BCx: no growth x 5 days  4/5 BCx (only 1 set): no growth x 5 days  4/7  BCx: no growth x 5 day  4/2 Wound Cx: multiple organisms 4/3 UCx: no growth  4/7 UCx: no growth  4/7 Tracheal Aspirate: few candida albicans  4/2 MRSA PCR: negative 4/11 Wound Culture: Escherichia Coli 4/12 Wound Culture: Abundant Gram negative rods   Thank you for allowing pharmacy to be a part of this patient's care.  Kisa Fujii L 08/05/2017 11:05 PM

## 2017-08-19 NOTE — Progress Notes (Signed)
CC follow up resp failure  HPI  This 57 year old gentleman remains critically ill on mechanical ventilation. He is POD #2 of reopening of laparotomy, removal of retention sutures, excisional debridement of abdominal wall ( muscle, fascia and skin) and replacement of wound vac.  POD #4 of debridement of subcutaneous tissue and fascia of abdominal wall wound. POD# 7 of laparotomy with diverting loop ileostomy and POD # 13 of exploratory laparotomy for treatment of perforated viscus.  He remains on mechanical ventilation.  On vasopressors  Plan for OR today  Vitals:   09/01/2017 0700 08/31/2017 0729 08/10/2017 0730 08/23/2017 0800  BP: (!) 98/49  (!) 98/48 (!) 104/45  Pulse: 77  75 81  Resp: 15  17 15   Temp:      TempSrc:      SpO2: 99% 96% 100% 96%  Weight:      Height:      RA  Gen:GCS<8T  on the vent HEENT: NCAT, sclerae white Neck: No JVD Lungs: No adventitious sounds anteriorly, no increase airway pressure Cardiovascular: Regular, no M Abdomen: Distended,  JPs x 4, ileostomy- pink and patent, wound vac in place Ext: 2+ upper extremities oedema Neuro: Sedated Skin: warm and dry  BMP Latest Ref Rng & Units 08/19/2017 08/18/2017 08/11/2017  Glucose 65 - 99 mg/dL 578(I189(H) 696(E151(H) 952(W120(H)  BUN 6 - 20 mg/dL 41(L78(H) 24(M67(H) 01(U58(H)  Creatinine 0.61 - 1.24 mg/dL 2.72(Z5.03(H) 3.66(Y3.86(H) 4.03(K2.85(H)  Sodium 135 - 145 mmol/L 126(L) 132(L) 139  Potassium 3.5 - 5.1 mmol/L 3.9 4.1 3.6  Chloride 101 - 111 mmol/L 100(L) 105 110  CO2 22 - 32 mmol/L 15(L) 20(L) 22  Calcium 8.9 - 10.3 mg/dL 6.9(L) 6.8(L) 7.2(L)    CBC Latest Ref Rng & Units 08/06/2017 08/18/2017 08/17/2017  WBC 3.8 - 10.6 K/uL 13.8(H) 13.4(H) 8.3  Hemoglobin 13.0 - 18.0 g/dL 7.8(L) 7.2(L) 7.6(L)  Hematocrit 40.0 - 52.0 % 23.9(L) 23.2(L) 23.2(L)  Platelets 150 - 440 K/uL 383 403 374    CXR: No new film  IMPRESSION: 57 yo male with acute perforation viscus complicated by wound infections with multiple trips to OR for debridement with severe resp  failure and shock with acute renal failure c/w multiorgan failure  1.Respiratory Failure -continue Full MV support -continue Bronchodilator Therapy -Wean Fio2 and PEEP as tolerated May need to consider trach  2. ABD-GI-Occult bowel perforation S/P reopening of laparotomy with removal of retention sutures, excisional debridement of abdominal wall and replacement of wound vac today.  Back to OR todaY   3.Severe sepsis with shock on Levophed Peritonitis- GNB and GPC ( multiple diff organisms)- ID and sensi pending Continue Zosyn, fluconazole; Clindamycin and Vancomycin ABX added today  4.Acute Kidney Injury from ATN non oliguric Hypovolemia from 3 rd spacing  5.Nutrition On TPN Deconditioning Anemia of critical illness    Critical Care Time devoted to patient care services described in this note is 32 minutes.   Overall, patient is critically ill, prognosis is guarded.  Patient with Multiorgan failure and at high risk for cardiac arrest and death.    Lucie LeatherKurian David Damiya Sandefur, M.D.  [facility]  Medical Director [facility] Medical Director [facility]

## 2017-08-19 NOTE — Progress Notes (Signed)
After further evaluation and assessment patient has severe multiorgan failure in the setting of abdominal catastrophe  With perforated viscus  Patient has respiratory failure cardiovascular failure renal failure and GI failure I have discussed these findings with the wife at bedside  She has agreed and consented to DNR status based on the fact that patient is in terrible condition and poor prognosis and multiorgan failure  I have also explained that patient's prognosis is very poor and that she thinks he would not want any of this and would not want to live in a facility.  I have explained to the patient's wife that we can wait for his daughter to arrive and we can discuss plan of care   Family are satisfied with Plan of action and management. All questions answered  Lucie LeatherKurian David Joyleen Haselton, M.D.  [facility]  Medical Director [facility] Medical Director [facility]

## 2017-08-19 NOTE — Progress Notes (Signed)
   08/20/2017 1250  Clinical Encounter Type  Visited With Family  Visit Type Follow-up  Referral From Chaplain  Consult/Referral To Chaplain   Unit chaplain received page from on-call chaplain regarding staff request to follow up with patient spouse.  When chaplain responded, spouse declined to engage, said 'it's not you it's me' and that she was in direct conversation with 'the man upstairs.'  Chaplain offered to just sit and be present if spouse would like; she declined.  Chaplain apologized for disturbing her and said that chaplain would be available when/if needed.

## 2017-08-20 LAB — COMPREHENSIVE METABOLIC PANEL
ALK PHOS: 113 U/L (ref 38–126)
ALT: 7 U/L — ABNORMAL LOW (ref 17–63)
ANION GAP: 9 (ref 5–15)
AST: 12 U/L — ABNORMAL LOW (ref 15–41)
Albumin: 1.4 g/dL — ABNORMAL LOW (ref 3.5–5.0)
BILIRUBIN TOTAL: 1.5 mg/dL — AB (ref 0.3–1.2)
BUN: 97 mg/dL — ABNORMAL HIGH (ref 6–20)
CALCIUM: 7.3 mg/dL — AB (ref 8.9–10.3)
CO2: 14 mmol/L — ABNORMAL LOW (ref 22–32)
Chloride: 101 mmol/L (ref 101–111)
Creatinine, Ser: 6.03 mg/dL — ABNORMAL HIGH (ref 0.61–1.24)
GFR, EST AFRICAN AMERICAN: 11 mL/min — AB (ref >60)
GFR, EST NON AFRICAN AMERICAN: 9 mL/min — AB (ref >60)
GLUCOSE: 222 mg/dL — AB (ref 65–99)
Potassium: 5 mmol/L (ref 3.5–5.1)
Sodium: 124 mmol/L — ABNORMAL LOW (ref 135–145)
TOTAL PROTEIN: 6.5 g/dL (ref 6.5–8.1)

## 2017-08-20 LAB — CBC WITH DIFFERENTIAL/PLATELET
Basophils Absolute: 0 10*3/uL (ref 0–0.1)
Basophils Relative: 0 %
EOS PCT: 0 %
Eosinophils Absolute: 0 10*3/uL (ref 0–0.7)
HCT: 25.5 % — ABNORMAL LOW (ref 40.0–52.0)
Hemoglobin: 8 g/dL — ABNORMAL LOW (ref 13.0–18.0)
LYMPHS ABS: 0.8 10*3/uL — AB (ref 1.0–3.6)
LYMPHS PCT: 6 %
MCH: 25.9 pg — AB (ref 26.0–34.0)
MCHC: 31.4 g/dL — AB (ref 32.0–36.0)
MCV: 82.5 fL (ref 80.0–100.0)
MONO ABS: 0.7 10*3/uL (ref 0.2–1.0)
Monocytes Relative: 5 %
Neutro Abs: 12.5 10*3/uL — ABNORMAL HIGH (ref 1.4–6.5)
Neutrophils Relative %: 89 %
PLATELETS: 429 10*3/uL (ref 150–440)
RBC: 3.1 MIL/uL — ABNORMAL LOW (ref 4.40–5.90)
RDW: 19.4 % — AB (ref 11.5–14.5)
WBC: 14.1 10*3/uL — ABNORMAL HIGH (ref 3.8–10.6)

## 2017-08-20 LAB — GLUCOSE, CAPILLARY
GLUCOSE-CAPILLARY: 212 mg/dL — AB (ref 65–99)
Glucose-Capillary: 219 mg/dL — ABNORMAL HIGH (ref 65–99)
Glucose-Capillary: 217 mg/dL — ABNORMAL HIGH (ref 65–99)

## 2017-08-20 LAB — AEROBIC/ANAEROBIC CULTURE (SURGICAL/DEEP WOUND)

## 2017-08-20 LAB — AEROBIC/ANAEROBIC CULTURE W GRAM STAIN (SURGICAL/DEEP WOUND)

## 2017-08-20 LAB — MAGNESIUM: Magnesium: 1.9 mg/dL (ref 1.7–2.4)

## 2017-08-20 LAB — SURGICAL PATHOLOGY

## 2017-08-20 LAB — PHOSPHORUS: Phosphorus: 7.4 mg/dL — ABNORMAL HIGH (ref 2.5–4.6)

## 2017-08-20 LAB — TRIGLYCERIDES: Triglycerides: 136 mg/dL (ref <150)

## 2017-08-20 MED ORDER — MORPHINE 100MG IN NS 100ML (1MG/ML) PREMIX INFUSION
10.0000 mg/h | INTRAVENOUS | Status: DC
  Administered 2017-08-20 – 2017-08-21 (×4): 10 mg/h via INTRAVENOUS
  Filled 2017-08-20 (×3): qty 100

## 2017-08-20 MED ORDER — MORPHINE BOLUS VIA INFUSION
5.0000 mg | INTRAVENOUS | Status: DC | PRN
  Filled 2017-08-20: qty 20

## 2017-08-20 NOTE — Progress Notes (Signed)
CC follow up resp failure  HPI  This 57 year old gentleman remains critically ill on mechanical ventilation. He is POD #2 of reopening of laparotomy, removal of retention sutures, excisional debridement of abdominal wall ( muscle, fascia and skin) and replacement of wound vac.  POD #4 of debridement of subcutaneous tissue and fascia of abdominal wall wound. POD# 7 of laparotomy with diverting loop ileostomy and POD # 13 of exploratory laparotomy for treatment of perforated viscus.  He remains on mechanical ventilation.  On vasopressors  Progressive renal failure No UOP  Prognosis is grave  Vitals:   08/20/17 0400 08/20/17 0418 08/20/17 0500 08/20/17 0600  BP: 124/68  (!) 148/78 122/70  Pulse: 64  67 65  Resp: 20  18 20   Temp:      TempSrc:      SpO2: 99%  98% 98%  Weight:  226 lb 13.7 oz (102.9 kg)    Height:      RA  Gen:GCS<8T  on the vent HEENT: NCAT, sclerae white Neck: No JVD Lungs: No adventitious sounds anteriorly, no increase airway pressure Cardiovascular: Regular, no M Abdomen: Distended,  JPs x 4, ileostomy- pink and patent, wound vac in place Ext: 2+ upper extremities oedema Neuro: Sedated Skin: warm and dry  BMP Latest Ref Rng & Units 08/20/2017 February 13, 2018 08/18/2017  Glucose 65 - 99 mg/dL 811(B222(H) 147(W189(H) 295(A151(H)  BUN 6 - 20 mg/dL 21(H97(H) 08(M78(H) 57(Q67(H)  Creatinine 0.61 - 1.24 mg/dL 4.69(G6.03(H) 2.95(M5.03(H) 8.41(L3.86(H)  Sodium 135 - 145 mmol/L 124(L) 126(L) 132(L)  Potassium 3.5 - 5.1 mmol/L 5.0 3.9 4.1  Chloride 101 - 111 mmol/L 101 100(L) 105  CO2 22 - 32 mmol/L 14(L) 15(L) 20(L)  Calcium 8.9 - 10.3 mg/dL 7.3(L) 6.9(L) 6.8(L)    CBC Latest Ref Rng & Units 08/20/2017 February 13, 2018 08/18/2017  WBC 3.8 - 10.6 K/uL 14.1(H) 13.8(H) 13.4(H)  Hemoglobin 13.0 - 18.0 g/dL 8.0(L) 7.8(L) 7.2(L)  Hematocrit 40.0 - 52.0 % 25.5(L) 23.9(L) 23.2(L)  Platelets 150 - 440 K/uL 429 383 403    CXR: No new film  IMPRESSION:  57 yo male with acute perforation viscus complicated by wound infections  with multiple trips to OR for debridement with severe resp failure and shock with acute renal failure c/w multiorgan failure +ESBL in cultures  1.Respiratory Failure -continue Full MV support -continue Bronchodilator Therapy -Wean Fio2 and PEEP as tolerated May need to consider trach  However, patient with multiorgan failure and recommend Comfort care measures  2. ABD-GI-Occult bowel perforation S/P reopening of laparotomy with removal of retention sutures, excisional debridement of abdominal wall and replacement of wound vac today.  3.Severe sepsis with shock on Levophed Peritonitis- GNB and GPC ( multiple diff organisms)- ID and sens +ESBL Continue Zosyn, fluconazole; Clindamycin and Vancomycin ABX added today  4.Acute Kidney Injury from ATN non oliguric Hypovolemia from 3rd spacing Potassium elevated, poor UOP Will need CRRT but prognosis is very poor Recommend COMFORT CARE MEASURES  5.Nutrition On TPN Deconditioning Anemia of critical illness    Critical Care Time devoted to patient care services described in this note is 35 minutes.   Overall, patient is critically ill, prognosis is guarded.  Patient with Multiorgan failure and at high risk for cardiac arrest and death.    Lucie LeatherKurian David Jabaree Mercado, M.D.  [facility]  Medical Director [facility] Medical Director [facility] Cardio[facility]

## 2017-08-20 NOTE — Progress Notes (Signed)
Patient is placed on high fowlers position cuff deflated suctioned orally and endotracheally and then extubated to 4 lpm O2 [place]

## 2017-08-20 NOTE — Progress Notes (Addendum)
After further assessment, patient with multiorgan failure, patient with grave prognosis with abdominal catastrophe.  I have discussed clinic status with wife and daughter, they understand his grave prognosis and that he would NOT want to live like this.  They have decided and consented to Comfort Care measures. The family will decide when to proceed with comfort care.  I have spoken with Dr Earlene Plateravis who also agrees with comfort care measures and patient will NOT be going to OR today.   Family are satisfied with Plan of action and management. All questions answered  Lucie LeatherKurian David Geniene List, M.D.  [facility]  Medical Director [facility] Medical Director [facility] Cardio[facility]

## 2017-08-20 NOTE — Progress Notes (Signed)
Marylene LandAngela, RN called family to request a family meeting per Belia HemanKasa, MD. Family agreed to come in. Will update MD when family arrives.

## 2017-08-20 NOTE — Progress Notes (Signed)
Pt on comfort care. No family at the bedside at this time. Pt remains on morphine drip at this time. Plan to move to floor. Will continue to monitor.

## 2017-08-20 NOTE — Progress Notes (Signed)
The family has gathered at bedside and has consented to proceed with Comfort care measures  All questions answered   Family are satisfied with Plan of action and management. All questions answered Comfort care orders placed   Annaliz Aven Santiago Gladavid Vivyan Biggers, M.D.  [facility]  Medical Director [facility] Medical Director [facility] Cardio[facility]

## 2017-08-20 NOTE — Progress Notes (Signed)
Patient resting in bed at this time. Respirations labored and at approx. 15 per minute. Moderate amount of thick, bloody secretions suctioned from mouth. Patient warm and dry to the touch. Family at bedside. Report called to Sarah for patient to go to room 129. Will be transferring patient shortly.

## 2017-08-20 NOTE — Progress Notes (Signed)
Pt terminally extubated at 1645. Pt placed on 2L o2 via [place] for comfort and initiated at morphine drip at 10mg /hr. All other drips stopped. Family did not wish to be present at the time, but notified of extubation. Chaplain notified and will come by to visit with family once they arrive back to bedsde.

## 2017-08-20 NOTE — Progress Notes (Signed)
   08/20/17 1518  Clinical Encounter Type  Visited With Patient  Visit Type Follow-up (received order for end of life)  Spiritual Encounters  Spiritual Needs Prayer   Chaplain responded to order for end of life.  Patient alone in the room.  Chaplain offered silent prayer for patient, family and care team.  Chaplain checked with patient nurse, who indicated family just stepped out but that patient is being extubated.  Chaplain to monitor.

## 2017-08-20 NOTE — Progress Notes (Signed)
   08/20/17 1639  Clinical Encounter Type  Visited With Patient  Visit Type Follow-up  Spiritual Encounters  Spiritual Needs Prayer   While on unit, chaplain checked in on patient room.  No family present, nurse in room.  Chaplain offered silent prayer.

## 2017-08-20 NOTE — Progress Notes (Signed)
Nutrition Brief Follow-up Note  Chart reviewed, discussed with RN and on rounds. Patient will be transitioning to comfort care when family is ready.  Recommend discontinuing TPN once patient transitions to comfort care. No further nutrition interventions warranted at this time. Please re-consult RD as needed.  Helane RimaLeanne Cledis Sohn, MS, RD, LDN Office: [phone] Pager: [phone] After Hours/Weekend Pager: [phone]

## 2017-08-20 NOTE — Progress Notes (Signed)
OR still not available. Discussed patient's condition and ongoing care with his RN, wife, daughter, and granddaughter. Patient's family meeting with ICU physician tomorrow morning to reassess goals of care prior to considering tracheostomy, considering comfort care. Will cancel OR rather than transport critically ill patient on vasopressors with open abdomen in the middle of the night and will defer rescheduling VAC dressing change with washout, possible re-debridement until after family meeting.  -- Scherrie GerlachJason E. Earlene Plateravis, MD, RPVI [place]: [facility] - Partnering for exceptional care. Office: [phone]

## 2017-08-20 NOTE — Consult Note (Signed)
WOC follow-up: Surgery has been cancelled at this time for Vac dressing and ostomy assessment.  Pt now with comfort care goals, according to progress notes.  Please re-consult if further assistance is needed.  Thank-you,  Cammie Mcgeeawn Aslan Himes MSN, RN, CWOCN, [employer]AP, CNS [phone]

## 2017-09-03 NOTE — Progress Notes (Signed)
Informed by Vinetta Bergamoanielle, Charge RN, family was leaving to get funeral home information, would return to bedside to spend time with patient.

## 2017-09-03 NOTE — Discharge Planning (Signed)
Post-Mortem care completed and patinet transferring to the morgue, per Donation Services request.

## 2017-09-03 NOTE — Progress Notes (Signed)
[facility] Graham, [facility] made aware that patient has expired.  Orson Apeanielle Irania Durell, RN

## 2017-09-03 NOTE — Progress Notes (Signed)
Called Cory Graham to let her know that her husband is breathing very shallow and that she might want to come back in to be with him before he dies. Pt is agonal breathing, is not making urine, and respirations are 6-8 per minute. Henriette CombsSarah Auburn Hester RN

## 2017-09-03 NOTE — Progress Notes (Signed)
   08/29/2017 1215  Clinical Encounter Type  Visited With Health care provider  Visit Type Death  Referral From Nurse  Consult/Referral To Chaplain  Spiritual Encounters  Spiritual Needs Other (Comment)   CH was informed that PT had died. Family declined CH support.

## 2017-09-03 NOTE — Discharge Planning (Addendum)
Patient passed 1212. Nunzio Cobbsot Fergusson, RN confirmed.  Dr. Jeanene Erballed and notified.  AC notified. Family declined pastoral staff to round.  [place] donor services contacted. Family going to funeral home to make arrangement and will return shortly. Per Donor Services, eye care will be completed once family is ready to leave patient since patient may be candidate for eye and tissue donation.

## 2017-09-03 NOTE — Plan of Care (Signed)
  Problem: Pain Management: Goal: Satisfaction with pain management regimen will improve Outcome: Progressing   

## 2017-09-03 NOTE — Progress Notes (Signed)
Patient seen and examined, confirmed deceased. Time of death: 12:12 pm.  Patient's family aware and making arrangements for patient. Discussed with patient's RN.  -- Donis Kotowski E. Earlene Plateravis, MD, RPVI [place]: [facility] - Partnering for exceptional care. Office: [phone]

## 2017-09-03 NOTE — Discharge Summary (Signed)
Physician Discharge Summary  Patient ID: Cory Graham MRN: [id number] DOB/AGE: Jul 12, 1960 57 y.o.  Admit date: 08/05/2017 Discharge date: 2017/09/03  Admission Diagnoses:  Discharge Diagnoses:  Active Problems:   Bowel perforation (HCC)   Open wound of abdomen   Necrotizing soft tissue infection   Discharged Condition: deceased  Hospital Course: 57 y.o. male presented to [facility] ED for altered mental status several days after developing abdominal pain, fever, nausea with emesis, and abdominal distention without diarrhea. Due to patient's condition, patient's history was primarily obtained from patient's wife, who stated at that time that patient had not been evaluated by a physician >22 years and, even upon his abdominal pain with fever, emesis, and altered mental status, he still continued to refuse hospital evaluation. Workup was found to be significant for CT imaging demonstrating pneumoperitoneum, for which informed consent was obtained and documented, and patient underwent exploratory laparotomy with peritoneal lavage for >2 Liters of peritoneal pus without enteric material or identification of perforated viscus (Cintron-Diaz, 08/05/2017).  Post-operatively, patient remained intubated on multiple vasopressors with AKI until he began to drain feces from the lower portion of his midline laparotomy wound, for which he was brought back to the operating room for washout, diverting ileostomy, placement of large pelvic drains, and debridement of fascial necrosis to viable but limited fascial edges with placement of external retention sutures and negative pressure wound VAC. Subsequently, patient remained critically ill on vasopressors and was returned to the OR twice more for debridement of necrotic soft tissue including fascia, and patient's abdominal cavity was left open with Abthera wound vac. Shortly thereafter, while discussing placement of tracheostomy and PEG tubes, patient's wife decided patient  would not want to proceed with such measures, palliative care consultation had already been requested, and decision was made to focus patient's care to his comfort. He was then terminally extubated and transferred to med-surg room, where he died ~36 hours later and was pronounced deceased at 12:12 pm on 09-03-2017.  Consults: pulmonary/intensive care and ID  Significant Diagnostic Studies: radiology: CT scan: pneumoperitoneum suggestive of perforated viscus  Treatments: IV hydration, antibiotics: Zosyn, respiratory therapy: prolonged intubation and surgery: exploratory laparotomy, followed by re-exploration with drainage, debridement of necrotic fascia, and  diverting loop ileostomy followed by multiple re-explorations with washout, open abdomen, and debridement of necrotic fascia  Discharge Exam: Blood pressure (!) 85/43, pulse (!) 53, temperature 99.4 F (37.4 C), temperature source Oral, resp. rate 12, height  (1.753 m), weight 226 lb 13.7 oz (102.9 kg), SpO2 100 %. General appearance: morbidly obese and deceased GI: open abdomen with abthera wound VAC in place  Disposition:    Allergies as of 03-Sep-2017   No Known Allergies     Medication List    You have not been prescribed any medications.      Signed: Ancil Linsey 09/01/2017, 9:05 PM

## 2017-09-03 NOTE — Clinical Social Work Note (Signed)
CSW consulted for notification that patient has been made comfort measures. Patient is actively dying. Consult deferred. York SpanielMonica Hafiz Irion MSW,LCSW [phone]

## 2017-09-03 NOTE — Progress Notes (Signed)
   08/04/2017 0945  Clinical Encounter Type  Visited With Patient and family together  Visit Type Initial  Referral From Nurse  Consult/Referral To Chaplain  Spiritual Encounters  Spiritual Needs Emotional;Grief support   CH stopped by PT's RM and offered family support. PT is actively dying. CH will follow up as needed.

## 2017-09-03 DEATH — deceased

## 2018-07-05 IMAGING — CT CT ABD-PELV W/O CM
2 of 4 series · 15 of 46 positions shown, 17 images · non-contrast
Comparison: 08/04/2017

CLINICAL DATA: Patient is 3 Days Post-Op s/p exploratory laparotomy
and peritoneal lavage. 50y.o. Hispanic male with no significant past
medical history, who was admitted to [HOSPITAL] on 08/04/2017 for Free
intraperitoneal air 4QQ.MTbdominal pain, unspecified abdominal
location

EXAM:
CT ABDOMEN AND PELVIS WITHOUT CONTRAST
TECHNIQUE: Multidetector CT imaging of the abdomen and pelvis was performed
following the standard protocol without IV contrast.

[Series 3: routine abd/pel wo · axial · 0.98mm/px · z∈[-547,-72]mm · 12 of 113 slices shown, 14 images]
[im 9/113  soft-tissue]
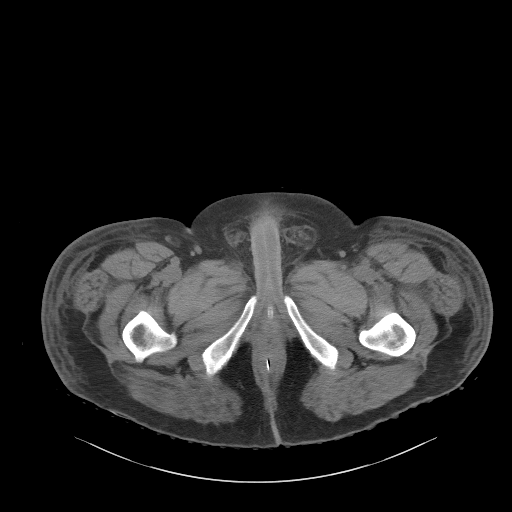
[im 9/113  bone]
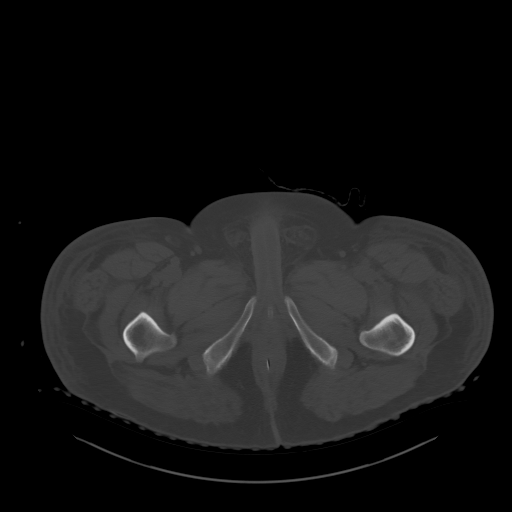
[im 18/113  soft-tissue]
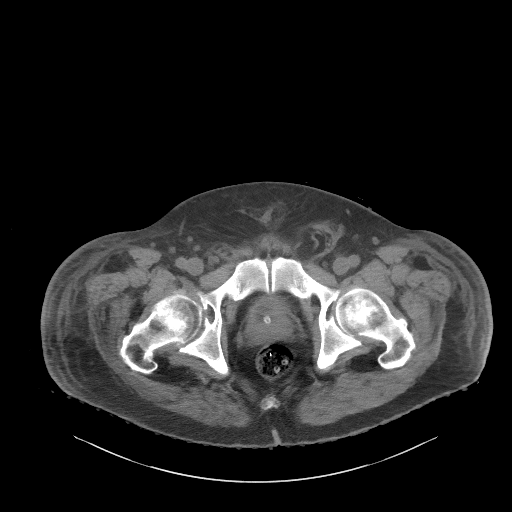
[im 26/113  soft-tissue]
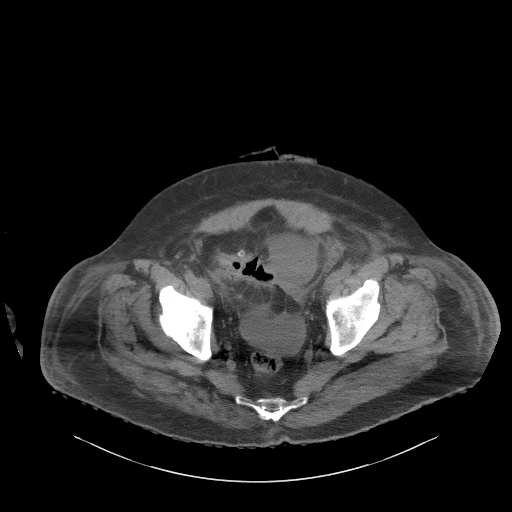
[im 35/113  soft-tissue]
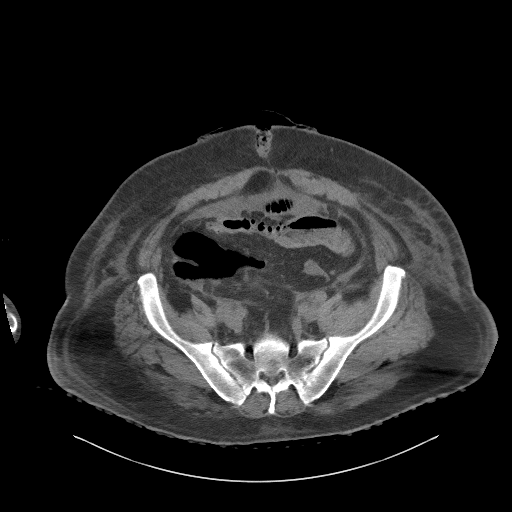
[im 44/113  soft-tissue]
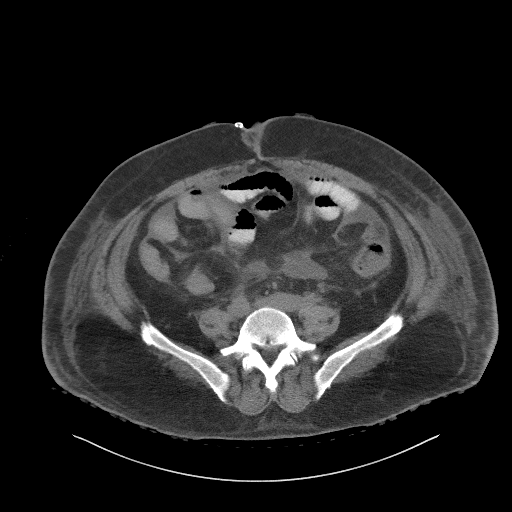
[im 52/113  soft-tissue]
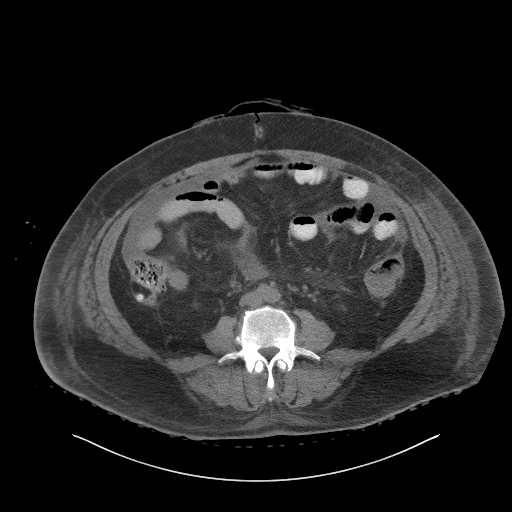
[im 61/113  soft-tissue]
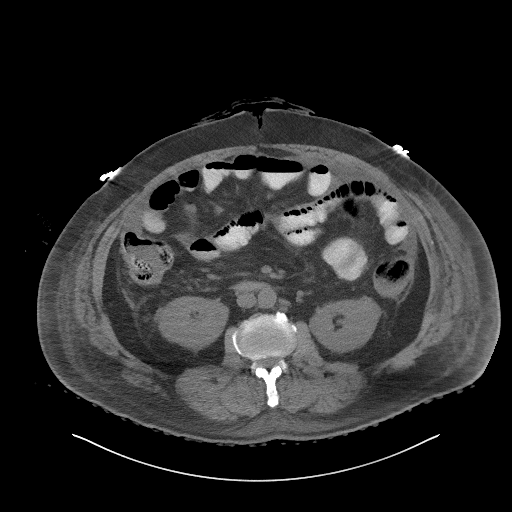
[im 69/113  soft-tissue]
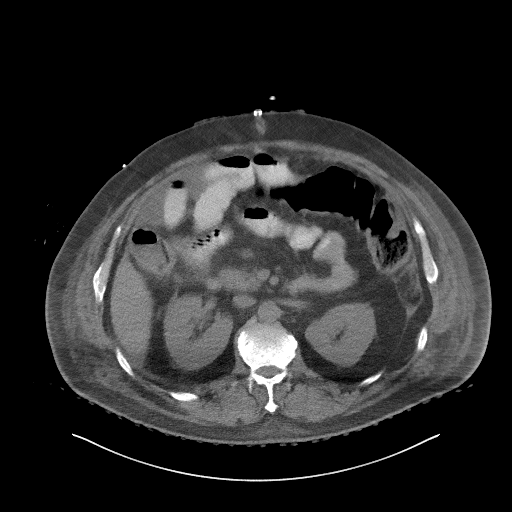
[im 78/113  soft-tissue]
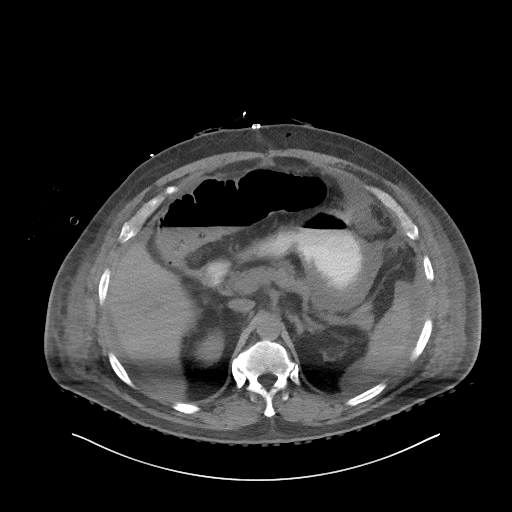
[im 78/113  bone]
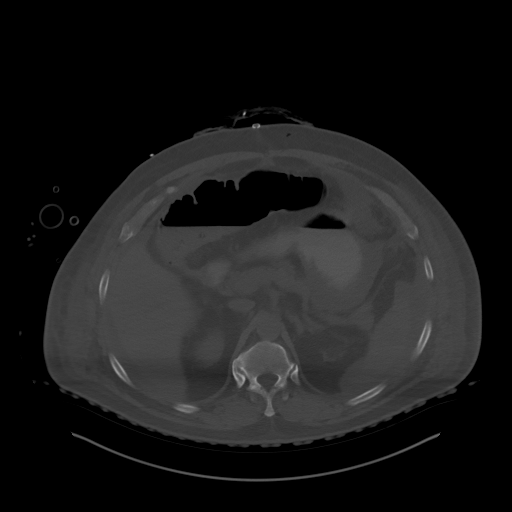
[im 87/113  soft-tissue]
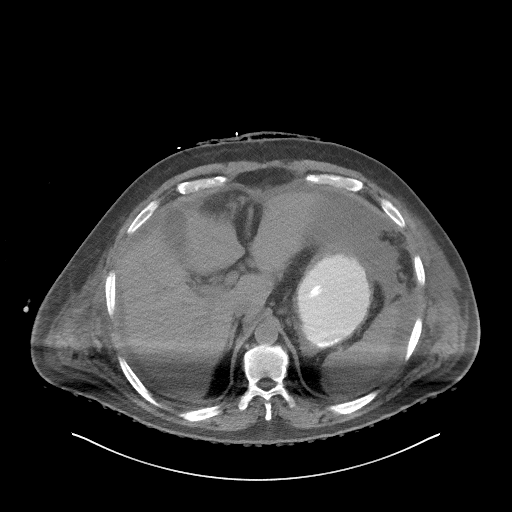
[im 95/113  soft-tissue]
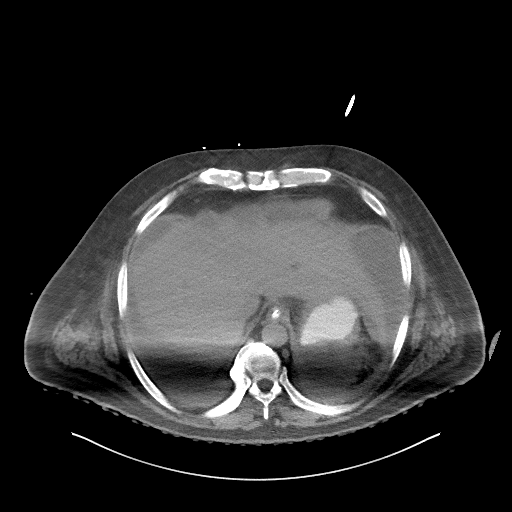
[im 104/113  soft-tissue]
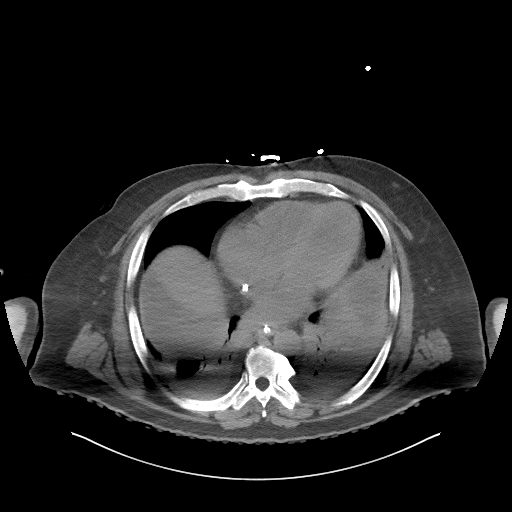

[Series 6: coronal st · coronal · 0.93mm/px · 3 of 109 slices shown]
[im 37/109  soft-tissue]
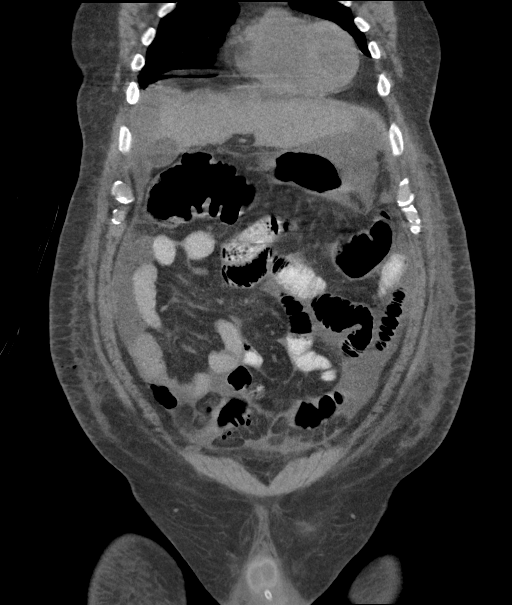
[im 49/109  soft-tissue]
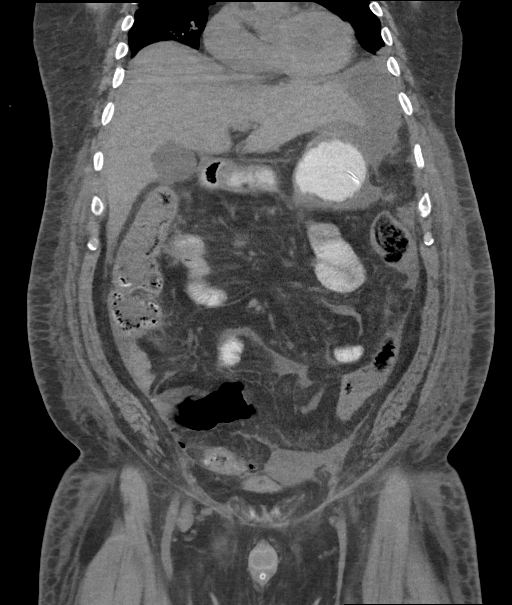
[im 61/109  soft-tissue]
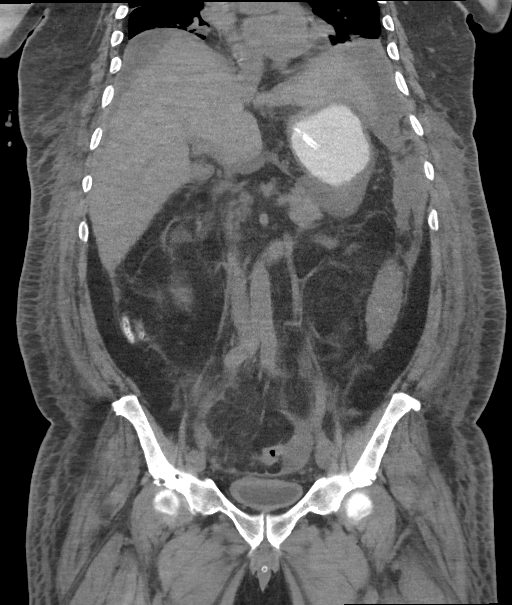

[15 of 46 positions shown; findings below may reference images not displayed]

FINDINGS: Lower chest: Dependent opacity is noted in both lower lobes
consistent with atelectasis. There are small bilateral pleural
effusions.

Hepatobiliary: Liver is unremarkable. Small amount of dense material
in the gallbladder neck is consistent with stones. No gallbladder
wall thickening or adjacent inflammation. No bile duct dilation.

Pancreas: Unremarkable. No pancreatic ductal dilatation or
surrounding inflammatory changes.

Spleen: Normal in size without focal abnormality.

Adrenals/Urinary Tract: No adrenal masses. 16 mm mass arises from
the anterior midpole the left kidney unchanged from prior exam. This
has Hounsfield units of approximately 24. No other renal masses, no
stones and no hydronephrosis. Normal ureters. Bladder is mostly
decompressed with a Foley catheter.

Stomach/Bowel: There are air-fluid levels within the colon and small
bowel consistent with a diffuse adynamic ileus. There is no evidence
of bowel obstruction. No wall thickening or convincing bowel
inflammation. Normal appendix is visualized.

Vascular/Lymphatic: No significant vascular findings are present. No
enlarged abdominal or pelvic lymph nodes.

Reproductive: Unremarkable

Other: There is a small amount of ascites that collects adjacent to
the liver and spleen and lies between leaves of the bowel mesentery,
as well as collecting in the pelvis. This is new since the prior CT.
There is no free intraperitoneal air. A small amount of subcutaneous
air is seen in the anterior upper abdomen. Air tracks along the
anterior abdominal wall incision.

Fat containing left inguinal hernia, stable from the prior exam.

Musculoskeletal: Arthropathic changes noted of both hips, advanced
on the right. No acute fracture. No osteoblastic or osteolytic
lesions.
IMPRESSION: 1. No residual free intraperitoneal air.
2. Small amount of ascites which is new since the prior CT. No
evidence of an abscess.
3. Bowel air-fluid levels consistent with a postoperative adynamic
ileus. No evidence of bowel obstruction or inflammation.
4. Lung base opacities consistent with atelectasis associated with
small effusions.
5. 16 mm indeterminate left renal mass, which may be solid.
Recommend follow-up renal MRI with and without contrast on a
nonemergent basis.
